# Patient Record
Sex: Female | Born: 1960
Health system: Southern US, Community
[De-identification: ages and names within clinical notes are randomized; demographics above are authoritative.]

## PROBLEM LIST (undated history)

## (undated) DIAGNOSIS — I10 Essential (primary) hypertension: Secondary | ICD-10-CM

## (undated) DIAGNOSIS — J189 Pneumonia, unspecified organism: Secondary | ICD-10-CM

## (undated) DIAGNOSIS — E119 Type 2 diabetes mellitus without complications: Secondary | ICD-10-CM

## (undated) HISTORY — PX: ABDOMINAL HYSTERECTOMY: SHX81

---

## 2008-05-22 ENCOUNTER — Ambulatory Visit: Payer: Self-pay | Admitting: Obstetrics & Gynecology

## 2008-05-22 ENCOUNTER — Inpatient Hospital Stay (HOSPITAL_COMMUNITY): Admission: AD | Admit: 2008-05-22 | Discharge: 2008-05-22 | Payer: Self-pay | Admitting: Obstetrics & Gynecology

## 2008-07-05 ENCOUNTER — Ambulatory Visit (HOSPITAL_COMMUNITY): Admission: RE | Admit: 2008-07-05 | Discharge: 2008-07-05 | Payer: Self-pay | Admitting: Family Medicine

## 2008-07-05 ENCOUNTER — Ambulatory Visit: Payer: Self-pay | Admitting: Obstetrics & Gynecology

## 2010-05-21 LAB — WET PREP, GENITAL
Trich, Wet Prep: NONE SEEN
Yeast Wet Prep HPF POC: NONE SEEN

## 2010-05-21 LAB — CBC
MCHC: 35.3 g/dL (ref 30.0–36.0)
RBC: 4.15 MIL/uL (ref 3.87–5.11)
RDW: 13.4 % (ref 11.5–15.5)

## 2010-06-24 NOTE — Group Therapy Note (Signed)
April Fry, April Fry          ACCOUNT NO.:  [id number]   MEDICAL RECORD NO.:  [id number]          PATIENT TYPE:  WOC   LOCATION:  [facility]                   FACILITY:  [facility]   PHYSICIAN:  Elsie Lincoln, MD      DATE OF BIRTH:  1960-04-26   DATE OF SERVICE:                                  CLINIC NOTE   Patient is a 50 year old female, G5, P5 who presents for followup of an  ovarian cyst that was found on May 22, 2008, in the [facility].  Patient had  2 small left ovarian cysts with some pain.  The pain is basically gone.  The patient had a CA-125 which was 12.5 and then a followup ultrasound  today which shows resolution of her left ovarian cyst.   PAST MEDICAL HISTORY:  Denies all major medical problems.   PAST SURGICAL HISTORY:  1. A hysterectomy and right oophorectomy.  2. An exploratory laparotomy with questionable small bowel      obstruction/adhesions.   MEDICATIONS:  None.   SOCIAL HISTORY:  Nonsmoker, nondrinker.  No drug abuse.   GYN HISTORY:  Para 5.  No history of STDs, abnormal Pap smears.  She did  have a hysterectomy for what sounds like bleeding and questionably  ovarian cysts.   FAMILY HISTORY:  Denies all major problems.   ALLERGIES:  NONE.   ASSESSMENT AND PLAN:  A 50 year old female with resolved left ovarian  cyst.  1. Patient needs a mammogram.  2. The patient can follow up in our clinic p.r.n.           ______________________________  Elsie Lincoln, MD     KL/MEDQ  D:  07/05/2008  T:  07/05/2008  Job:  [id number]

## 2016-05-15 ENCOUNTER — Encounter (HOSPITAL_COMMUNITY): Payer: Self-pay | Admitting: Nurse Practitioner

## 2016-05-15 ENCOUNTER — Emergency Department (HOSPITAL_COMMUNITY): Payer: Self-pay

## 2016-05-15 ENCOUNTER — Emergency Department (HOSPITAL_COMMUNITY)
Admission: EM | Admit: 2016-05-15 | Discharge: 2016-05-15 | Disposition: A | Discharge status: Home / Self Care | Payer: Self-pay | Attending: Emergency Medicine | Admitting: Emergency Medicine

## 2016-05-15 DIAGNOSIS — J181 Lobar pneumonia, unspecified organism: Secondary | ICD-10-CM | POA: Insufficient documentation

## 2016-05-15 DIAGNOSIS — Z79899 Other long term (current) drug therapy: Secondary | ICD-10-CM | POA: Insufficient documentation

## 2016-05-15 DIAGNOSIS — I10 Essential (primary) hypertension: Secondary | ICD-10-CM | POA: Insufficient documentation

## 2016-05-15 DIAGNOSIS — J189 Pneumonia, unspecified organism: Secondary | ICD-10-CM

## 2016-05-15 DIAGNOSIS — E119 Type 2 diabetes mellitus without complications: Secondary | ICD-10-CM | POA: Insufficient documentation

## 2016-05-15 HISTORY — DX: Type 2 diabetes mellitus without complications: E11.9

## 2016-05-15 HISTORY — DX: Essential (primary) hypertension: I10

## 2016-05-15 LAB — CBC WITH DIFFERENTIAL/PLATELET
BASOS ABS: 0 10*3/uL (ref 0.0–0.1)
Basophils Relative: 0 %
Eosinophils Absolute: 0.1 10*3/uL (ref 0.0–0.7)
Eosinophils Relative: 1 %
HEMATOCRIT: 41.8 % (ref 36.0–46.0)
HEMOGLOBIN: 14.3 g/dL (ref 12.0–15.0)
LYMPHS PCT: 20 %
Lymphs Abs: 1.3 10*3/uL (ref 0.7–4.0)
MCH: 29.4 pg (ref 26.0–34.0)
MCHC: 34.2 g/dL (ref 30.0–36.0)
MCV: 86 fL (ref 78.0–100.0)
MONO ABS: 0.4 10*3/uL (ref 0.1–1.0)
Monocytes Relative: 7 %
NEUTROS ABS: 4.7 10*3/uL (ref 1.7–7.7)
NEUTROS PCT: 72 %
Platelets: 231 10*3/uL (ref 150–400)
RBC: 4.86 MIL/uL (ref 3.87–5.11)
RDW: 12.8 % (ref 11.5–15.5)
WBC: 6.6 10*3/uL (ref 4.0–10.5)

## 2016-05-15 LAB — URINALYSIS, ROUTINE W REFLEX MICROSCOPIC
BILIRUBIN URINE: NEGATIVE
GLUCOSE, UA: 50 mg/dL — AB
HGB URINE DIPSTICK: NEGATIVE
Ketones, ur: 5 mg/dL — AB
LEUKOCYTES UA: NEGATIVE
Nitrite: NEGATIVE
PROTEIN: 100 mg/dL — AB
Specific Gravity, Urine: 1.038 — ABNORMAL HIGH (ref 1.005–1.030)
pH: 5 (ref 5.0–8.0)

## 2016-05-15 LAB — COMPREHENSIVE METABOLIC PANEL
ALBUMIN: 3.9 g/dL (ref 3.5–5.0)
ALT: 27 U/L (ref 14–54)
ANION GAP: 10 (ref 5–15)
AST: 24 U/L (ref 15–41)
Alkaline Phosphatase: 94 U/L (ref 38–126)
BUN: 7 mg/dL (ref 6–20)
CHLORIDE: 104 mmol/L (ref 101–111)
CO2: 21 mmol/L — ABNORMAL LOW (ref 22–32)
Calcium: 8.7 mg/dL — ABNORMAL LOW (ref 8.9–10.3)
Creatinine, Ser: 0.47 mg/dL (ref 0.44–1.00)
GFR calc Af Amer: >60 mL/min (ref >60)
Glucose, Bld: 199 mg/dL — ABNORMAL HIGH (ref 65–99)
POTASSIUM: 3.7 mmol/L (ref 3.5–5.1)
SODIUM: 135 mmol/L (ref 135–145)
Total Bilirubin: 0.6 mg/dL (ref 0.3–1.2)
Total Protein: 8.2 g/dL — ABNORMAL HIGH (ref 6.5–8.1)

## 2016-05-15 LAB — I-STAT CG4 LACTIC ACID, ED: LACTIC ACID, VENOUS: 1.08 mmol/L (ref 0.5–1.9)

## 2016-05-15 MED ORDER — HYDROCOD POLST-CPM POLST ER 10-8 MG/5ML PO SUER
5.0000 mL | Freq: Once | ORAL | Status: AC
Start: 2016-05-15 — End: 2016-05-15
  Administered 2016-05-15: 5 mL via ORAL
  Filled 2016-05-15: qty 5

## 2016-05-15 MED ORDER — IBUPROFEN 200 MG PO TABS
600.0000 mg | ORAL_TABLET | Freq: Once | ORAL | Status: AC
  Administered 2016-05-15: 600 mg via ORAL
  Filled 2016-05-15: qty 1

## 2016-05-15 MED ORDER — ACETAMINOPHEN 325 MG PO TABS
ORAL_TABLET | ORAL | Status: AC
  Administered 2016-05-15: 650 mg via ORAL
  Filled 2016-05-15: qty 2

## 2016-05-15 MED ORDER — AZITHROMYCIN 250 MG PO TABS
500.0000 mg | ORAL_TABLET | Freq: Once | ORAL | Status: AC
  Administered 2016-05-15: 500 mg via ORAL
  Filled 2016-05-15: qty 2

## 2016-05-15 MED ORDER — ACETAMINOPHEN 325 MG PO TABS
650.0000 mg | ORAL_TABLET | Freq: Once | ORAL | Status: AC | PRN
  Administered 2016-05-15: 650 mg via ORAL

## 2016-05-15 MED ORDER — AZITHROMYCIN 250 MG PO TABS: 250.0000 mg | ORAL_TABLET | Freq: Every day | ORAL | 0 refills | Status: DC

## 2016-05-15 MED ORDER — BENZONATATE 100 MG PO CAPS: 100.0000 mg | ORAL_CAPSULE | Freq: Three times a day (TID) | ORAL | 0 refills | Status: DC | PRN

## 2016-05-15 NOTE — ED Triage Notes (Signed)
Pt presents with c/o cough. The cough began on Monday. She reports malaise, fevers, headaches, body aches, blood tinged sputum. She has been taking OTC cold and cough medications with no improvement. Her symptoms have been getting worse since onset

## 2016-05-15 NOTE — Discharge Instructions (Signed)
Drink plenty of fluids. Rest. Take tylenol and motrin for fever and pain. Salt water gargles several times a day for sore throat. Tessalon as prescribed for cough. Take zithromax as prescribed until all gone for infection. Follow up with family doctor as needed.

## 2016-05-15 NOTE — ED Provider Notes (Signed)
[facility] Provider Note   CSN: [id number] Arrival date & time: 05/15/16  1752     History   Chief Complaint Chief Complaint  Patient presents with  . URI    HPI April Fry is a 56 y.o. female.  HPI April Fry is a 56 y.o. female with history of hypertension and diabetes, presents to emergency department complaining of nasal congestion, sore throat, headache, cough, fever, chills. He states symptoms started 4 days ago. She states that today she started having shortness of breath which is what prompted her to come to emergency department. She has been taking over-the-counter cold and flu medication and Delsym cough syrup with no relief. She denies any chest pain or abdominal pain. No nausea or vomiting. No diarrhea. No neck pain or stiffness. She does admit to shortness of breath especially on exertion.  Past Medical History:  Diagnosis Date  . Diabetes mellitus without complication (HCC)   . Hypertension     There are no active problems to display for this patient.   Past Surgical History:  Procedure Laterality Date  . ABDOMINAL SURGERY      OB History    No data available       Home Medications    Prior to Admission medications   Not on File    Family History History reviewed. No pertinent family history.  Social History Social History  Substance Use Topics  . Smoking status: Never Smoker  . Smokeless tobacco: Never Used  . Alcohol use No     Allergies   Patient has no known allergies.   Review of Systems Review of Systems  Constitutional: Positive for chills and fever.  HENT: Positive for congestion and sore throat.   Respiratory: Positive for cough and shortness of breath. Negative for chest tightness and wheezing.   Cardiovascular: Negative for chest pain, palpitations and leg swelling.  Gastrointestinal: Negative for abdominal pain, diarrhea, nausea and vomiting.  Musculoskeletal: Negative for arthralgias, myalgias,  neck pain and neck stiffness.  Skin: Negative for rash.  Neurological: Positive for headaches. Negative for dizziness and weakness.  All other systems reviewed and are negative.    Physical Exam Updated Vital Signs BP (!) 165/88 (BP Location: Right Arm)   Pulse 92   Temp (!) 101.2 F (38.4 C) (Oral)   Resp 20   Ht  (1.422 m)   Wt 87 kg   SpO2 96%   BMI 43.00 kg/m   Physical Exam  Constitutional: She appears well-developed and well-nourished. No distress.  HENT:  Head: Normocephalic and atraumatic.  Right Ear: Tympanic membrane, external ear and ear canal normal.  Left Ear: Tympanic membrane, external ear and ear canal normal.  Nose: Mucosal edema and rhinorrhea present.  Mouth/Throat: Uvula is midline and mucous membranes are normal. Posterior oropharyngeal erythema present. No oropharyngeal exudate or posterior oropharyngeal edema. Tonsils are 1+ on the right. Tonsils are 1+ on the left.  Eyes: Conjunctivae are normal.  Neck: Neck supple.  Cardiovascular: Normal rate, regular rhythm and normal heart sounds.   Pulmonary/Chest: Effort normal and breath sounds normal. No respiratory distress. She has no wheezes. She has no rales.  Patient is coughing  Abdominal: Soft. Bowel sounds are normal. She exhibits no distension. There is no tenderness. There is no rebound.  Musculoskeletal: She exhibits no edema.  Neurological: She is alert.  Skin: Skin is warm and dry.  Psychiatric: She has a normal mood and affect. Her behavior is normal.  Nursing note and vitals  reviewed.    ED Treatments / Results  Labs (all labs ordered are listed, but only abnormal results are displayed) Labs Reviewed  COMPREHENSIVE METABOLIC PANEL - Abnormal; Notable for the following:       Result Value   CO2 21 (*)    Glucose, Bld 199 (*)    Calcium 8.7 (*)    Total Protein 8.2 (*)    All other components within normal limits  URINALYSIS, ROUTINE W REFLEX MICROSCOPIC - Abnormal; Notable for the  following:    Color, Urine AMBER (*)    APPearance HAZY (*)    Specific Gravity, Urine 1.038 (*)    Glucose, UA 50 (*)    Ketones, ur 5 (*)    Protein, ur 100 (*)    Bacteria, UA RARE (*)    Squamous Epithelial / LPF 0-5 (*)    All other components within normal limits  CBC WITH DIFFERENTIAL/PLATELET  I-STAT CG4 LACTIC ACID, ED    EKG  EKG Interpretation None       Radiology Dg Chest 2 View  Result Date: 05/15/2016 CLINICAL DATA:  Cough, mildly is EXAM: CHEST  2 VIEW COMPARISON:  None. FINDINGS: Right lung is grossly clear. There is ill-defined infiltrate within the lingula and left lower lobe. No pleural effusion. Normal heart size. No pneumothorax. IMPRESSION: Ill-defined lingular and left lower lobe infiltrate. Electronically Signed   By: Jasmine Pang M.D.   On: 05/15/2016 19:05    Procedures Procedures (including critical care time)  Medications Ordered in ED Medications  azithromycin (ZITHROMAX) tablet 500 mg (not administered)  chlorpheniramine-HYDROcodone (TUSSIONEX) 10-8 MG/5ML suspension 5 mL (not administered)  ibuprofen (ADVIL,MOTRIN) tablet 600 mg (not administered)  acetaminophen (TYLENOL) tablet 650 mg (650 mg Oral Given 05/15/16 1824)     Initial Impression / Assessment and Plan / ED Course  I have reviewed the triage vital signs and the nursing notes.  Pertinent labs & imaging results that were available during my care of the patient were reviewed by me and considered in my medical decision making (see chart for details).    Patient in emergency department with fever, chills, URI symptoms, shortness of breath that started today. Her workup was performed by triage nurse and lab work came back all unremarkable. White blood cell count of 6.6. Lactic acid is normal at 1.08. No abnormality in the electrolytes. Patient's initial temperature in emergency was 11.2. She was given Tylenol. I rechecked her temperature myself, it was 100.2. Patient's chest x-ray showing  pneumonia. Her heart rate is normal. Blood pressure high, 156/72. No evidence of sepsis. Pt ambulated in ED, maintaining oxygen sat above 95%. Will start on zithromax. Continue tylenol and motrin for body aches and fever. Tessalon for cough. Follow up with family doctor.  Return precautions discussed.   Vitals:   05/15/16 2100 05/15/16 2115 05/15/16 2130 05/15/16 2145  BP: (!) 152/68 (!) 166/61 (!) 156/72 (!) 165/69  Pulse: 89 95 85 89  Resp:      Temp:      TempSrc:      SpO2: 94% 94% 95% 92%  Weight:      Height:          Final Clinical Impressions(s) / ED Diagnoses   Final diagnoses:  Community acquired pneumonia of left lung, unspecified part of lung    New Prescriptions Discharge Medication List as of 05/15/2016 10:04 PM    START taking these medications   Details  azithromycin (ZITHROMAX) 250 MG tablet Take 1  tablet (250 mg total) by mouth daily. Take first 2 tablets together, then 1 every day until finished., Starting Fri 05/15/2016, Print    benzonatate (TESSALON PERLES) 100 MG capsule Take 1 capsule (100 mg total) by mouth 3 (three) times daily as needed for cough., Starting Fri 05/15/2016, Print         Daniesha Driver, PA-C 05/16/16 1610    Derwood Kaplan, MD 05/24/16 [id number]

## 2016-05-20 ENCOUNTER — Encounter (HOSPITAL_COMMUNITY): Payer: Self-pay

## 2016-05-20 ENCOUNTER — Emergency Department (HOSPITAL_COMMUNITY): Payer: Self-pay

## 2016-05-20 DIAGNOSIS — M940 Chondrocostal junction syndrome [Tietze]: Secondary | ICD-10-CM | POA: Diagnosis present

## 2016-05-20 DIAGNOSIS — R0902 Hypoxemia: Secondary | ICD-10-CM | POA: Diagnosis present

## 2016-05-20 DIAGNOSIS — E119 Type 2 diabetes mellitus without complications: Secondary | ICD-10-CM | POA: Diagnosis present

## 2016-05-20 DIAGNOSIS — J209 Acute bronchitis, unspecified: Secondary | ICD-10-CM | POA: Diagnosis present

## 2016-05-20 DIAGNOSIS — I10 Essential (primary) hypertension: Secondary | ICD-10-CM | POA: Diagnosis present

## 2016-05-20 DIAGNOSIS — I447 Left bundle-branch block, unspecified: Secondary | ICD-10-CM | POA: Diagnosis present

## 2016-05-20 DIAGNOSIS — J189 Pneumonia, unspecified organism: Principal | ICD-10-CM | POA: Diagnosis present

## 2016-05-20 DIAGNOSIS — Z90710 Acquired absence of both cervix and uterus: Secondary | ICD-10-CM

## 2016-05-20 LAB — BASIC METABOLIC PANEL
Anion gap: 6 (ref 5–15)
BUN: 12 mg/dL (ref 6–20)
CHLORIDE: 105 mmol/L (ref 101–111)
CO2: 24 mmol/L (ref 22–32)
CREATININE: 0.5 mg/dL (ref 0.44–1.00)
Calcium: 8.8 mg/dL — ABNORMAL LOW (ref 8.9–10.3)
GFR calc Af Amer: >60 mL/min (ref >60)
Glucose, Bld: 185 mg/dL — ABNORMAL HIGH (ref 65–99)
POTASSIUM: 3.9 mmol/L (ref 3.5–5.1)
SODIUM: 135 mmol/L (ref 135–145)

## 2016-05-20 LAB — CBC
HEMATOCRIT: 40.7 % (ref 36.0–46.0)
Hemoglobin: 13.7 g/dL (ref 12.0–15.0)
MCH: 28.5 pg (ref 26.0–34.0)
MCHC: 33.7 g/dL (ref 30.0–36.0)
MCV: 84.6 fL (ref 78.0–100.0)
PLATELETS: 297 10*3/uL (ref 150–400)
RBC: 4.81 MIL/uL (ref 3.87–5.11)
RDW: 12.6 % (ref 11.5–15.5)
WBC: 6.1 10*3/uL (ref 4.0–10.5)

## 2016-05-20 LAB — I-STAT TROPONIN, ED: Troponin i, poc: 0 ng/mL (ref 0.00–0.08)

## 2016-05-20 NOTE — ED Triage Notes (Signed)
Pt was seen her last week for cough and CP and SOB, diagnosed with pneumonia, finished ABT and continues to have SOB,CP and productive blood tinged sputum. Reports fevers

## 2016-05-21 ENCOUNTER — Inpatient Hospital Stay (HOSPITAL_COMMUNITY)
Admission: EM | Admit: 2016-05-21 | Discharge: 2016-05-23 | DRG: 195 | Disposition: A | Discharge status: Home / Self Care | Payer: Self-pay | Attending: Family Medicine | Admitting: Family Medicine

## 2016-05-21 ENCOUNTER — Emergency Department (HOSPITAL_COMMUNITY): Payer: Self-pay

## 2016-05-21 ENCOUNTER — Encounter (HOSPITAL_COMMUNITY): Payer: Self-pay | Admitting: Family Medicine

## 2016-05-21 DIAGNOSIS — E119 Type 2 diabetes mellitus without complications: Secondary | ICD-10-CM

## 2016-05-21 DIAGNOSIS — J181 Lobar pneumonia, unspecified organism: Secondary | ICD-10-CM

## 2016-05-21 DIAGNOSIS — J189 Pneumonia, unspecified organism: Secondary | ICD-10-CM

## 2016-05-21 DIAGNOSIS — R0902 Hypoxemia: Secondary | ICD-10-CM

## 2016-05-21 DIAGNOSIS — I10 Essential (primary) hypertension: Secondary | ICD-10-CM

## 2016-05-21 DIAGNOSIS — I447 Left bundle-branch block, unspecified: Secondary | ICD-10-CM | POA: Diagnosis present

## 2016-05-21 HISTORY — DX: Pneumonia, unspecified organism: J18.9

## 2016-05-21 LAB — GLUCOSE, CAPILLARY
GLUCOSE-CAPILLARY: 173 mg/dL — AB (ref 65–99)
GLUCOSE-CAPILLARY: 169 mg/dL — AB (ref 65–99)
Glucose-Capillary: 184 mg/dL — ABNORMAL HIGH (ref 65–99)
Glucose-Capillary: 204 mg/dL — ABNORMAL HIGH (ref 65–99)

## 2016-05-21 LAB — EXPECTORATED SPUTUM ASSESSMENT W GRAM STAIN, RFLX TO RESP C

## 2016-05-21 LAB — CBC
HEMATOCRIT: 39.8 % (ref 36.0–46.0)
HEMOGLOBIN: 13.4 g/dL (ref 12.0–15.0)
MCH: 28.6 pg (ref 26.0–34.0)
MCHC: 33.7 g/dL (ref 30.0–36.0)
MCV: 85 fL (ref 78.0–100.0)
Platelets: 272 10*3/uL (ref 150–400)
RBC: 4.68 MIL/uL (ref 3.87–5.11)
RDW: 12.7 % (ref 11.5–15.5)
WBC: 6 10*3/uL (ref 4.0–10.5)

## 2016-05-21 LAB — HEMOGLOBIN A1C
HEMOGLOBIN A1C: 9.2 % — AB (ref 4.8–5.6)
Mean Plasma Glucose: 217 mg/dL

## 2016-05-21 LAB — CREATININE, SERUM: Creatinine, Ser: 0.45 mg/dL (ref 0.44–1.00)

## 2016-05-21 LAB — EXPECTORATED SPUTUM ASSESSMENT W REFEX TO RESP CULTURE

## 2016-05-21 LAB — TROPONIN I: Troponin I: <0.03 ng/mL (ref <0.03)

## 2016-05-21 LAB — D-DIMER, QUANTITATIVE: D-Dimer, Quant: <0.27 ug/mL-FEU (ref 0.00–0.50)

## 2016-05-21 LAB — STREP PNEUMONIAE URINARY ANTIGEN: STREP PNEUMO URINARY ANTIGEN: NEGATIVE

## 2016-05-21 MED ORDER — HYDRALAZINE HCL 20 MG/ML IJ SOLN: 10.0000 mg | Freq: Three times a day (TID) | INTRAMUSCULAR | Status: DC | PRN

## 2016-05-21 MED ORDER — ONDANSETRON HCL 4 MG/2ML IJ SOLN: 4.0000 mg | Freq: Four times a day (QID) | INTRAMUSCULAR | Status: DC | PRN

## 2016-05-21 MED ORDER — ONDANSETRON HCL 4 MG PO TABS: 4.0000 mg | ORAL_TABLET | Freq: Four times a day (QID) | ORAL | Status: DC | PRN

## 2016-05-21 MED ORDER — TRAZODONE HCL 50 MG PO TABS
25.0000 mg | ORAL_TABLET | Freq: Every evening | ORAL | Status: DC | PRN
Start: 2016-05-21 — End: 2016-05-23

## 2016-05-21 MED ORDER — IOPAMIDOL (ISOVUE-370) INJECTION 76%
INTRAVENOUS | Status: AC
  Administered 2016-05-21: 100 mL
  Filled 2016-05-21: qty 100

## 2016-05-21 MED ORDER — ALBUTEROL SULFATE (2.5 MG/3ML) 0.083% IN NEBU
2.5000 mg | INHALATION_SOLUTION | RESPIRATORY_TRACT | Status: DC | PRN
Start: 2016-05-21 — End: 2016-05-22

## 2016-05-21 MED ORDER — LEVOFLOXACIN IN D5W 750 MG/150ML IV SOLN
750.0000 mg | INTRAVENOUS | Status: DC
  Administered 2016-05-22 – 2016-05-23 (×2): 750 mg via INTRAVENOUS
  Filled 2016-05-21 (×2): qty 150

## 2016-05-21 MED ORDER — CYCLOBENZAPRINE HCL 10 MG PO TABS
10.0000 mg | ORAL_TABLET | Freq: Once | ORAL | Status: AC
  Administered 2016-05-21: 10 mg via ORAL
  Filled 2016-05-21: qty 1

## 2016-05-21 MED ORDER — HEPARIN SODIUM (PORCINE) 5000 UNIT/ML IJ SOLN
5000.0000 [IU] | Freq: Three times a day (TID) | INTRAMUSCULAR | Status: DC
  Administered 2016-05-21 – 2016-05-23 (×7): 5000 [IU] via SUBCUTANEOUS
  Filled 2016-05-21 (×7): qty 1

## 2016-05-21 MED ORDER — SODIUM CHLORIDE 0.9 % IV BOLUS (SEPSIS)
1000.0000 mL | Freq: Once | INTRAVENOUS | Status: AC
  Administered 2016-05-21: 1000 mL via INTRAVENOUS

## 2016-05-21 MED ORDER — INSULIN ASPART 100 UNIT/ML ~~LOC~~ SOLN
0.0000 [IU] | Freq: Three times a day (TID) | SUBCUTANEOUS | Status: DC
  Administered 2016-05-21: 4 [IU] via SUBCUTANEOUS
  Administered 2016-05-21: 7 [IU] via SUBCUTANEOUS
  Administered 2016-05-21 – 2016-05-22 (×2): 4 [IU] via SUBCUTANEOUS
  Administered 2016-05-22: 3 [IU] via SUBCUTANEOUS
  Administered 2016-05-22: 4 [IU] via SUBCUTANEOUS
  Administered 2016-05-23: 3 [IU] via SUBCUTANEOUS
  Administered 2016-05-23: 7 [IU] via SUBCUTANEOUS

## 2016-05-21 MED ORDER — INSULIN ASPART 100 UNIT/ML ~~LOC~~ SOLN: 0.0000 [IU] | Freq: Three times a day (TID) | SUBCUTANEOUS | Status: DC

## 2016-05-21 MED ORDER — POLYETHYLENE GLYCOL 3350 17 G PO PACK
17.0000 g | PACK | Freq: Every day | ORAL | Status: DC | PRN
Start: 2016-05-21 — End: 2016-05-23

## 2016-05-21 MED ORDER — IPRATROPIUM-ALBUTEROL 0.5-2.5 (3) MG/3ML IN SOLN
3.0000 mL | Freq: Four times a day (QID) | RESPIRATORY_TRACT | Status: DC
  Administered 2016-05-21 (×3): 3 mL via RESPIRATORY_TRACT
  Filled 2016-05-21 (×3): qty 3

## 2016-05-21 MED ORDER — LEVOFLOXACIN IN D5W 750 MG/150ML IV SOLN: 750.0000 mg | Freq: Once | INTRAVENOUS | Status: DC

## 2016-05-21 MED ORDER — ASPIRIN 325 MG PO TABS
325.0000 mg | ORAL_TABLET | Freq: Once | ORAL | Status: AC
  Administered 2016-05-21: 325 mg via ORAL
  Filled 2016-05-21: qty 1

## 2016-05-21 MED ORDER — HYDROCODONE-ACETAMINOPHEN 5-325 MG PO TABS: 1.0000 | ORAL_TABLET | ORAL | Status: DC | PRN

## 2016-05-21 MED ORDER — SODIUM CHLORIDE 0.9% FLUSH
3.0000 mL | Freq: Two times a day (BID) | INTRAVENOUS | Status: DC
  Administered 2016-05-21 – 2016-05-22 (×4): 3 mL via INTRAVENOUS

## 2016-05-21 MED ORDER — ACETAMINOPHEN 325 MG PO TABS
650.0000 mg | ORAL_TABLET | Freq: Four times a day (QID) | ORAL | Status: DC | PRN
  Administered 2016-05-21: 650 mg via ORAL
  Filled 2016-05-21: qty 2

## 2016-05-21 MED ORDER — ACETAMINOPHEN 650 MG RE SUPP: 650.0000 mg | Freq: Four times a day (QID) | RECTAL | Status: DC | PRN

## 2016-05-21 MED ORDER — NITROGLYCERIN 0.4 MG SL SUBL: 0.4000 mg | SUBLINGUAL_TABLET | SUBLINGUAL | Status: DC | PRN

## 2016-05-21 MED ORDER — LEVOFLOXACIN IN D5W 750 MG/150ML IV SOLN
750.0000 mg | Freq: Once | INTRAVENOUS | Status: AC
  Administered 2016-05-21: 750 mg via INTRAVENOUS
  Filled 2016-05-21: qty 150

## 2016-05-21 MED ORDER — IPRATROPIUM-ALBUTEROL 0.5-2.5 (3) MG/3ML IN SOLN
3.0000 mL | Freq: Once | RESPIRATORY_TRACT | Status: AC
  Administered 2016-05-21: 3 mL via RESPIRATORY_TRACT
  Filled 2016-05-21: qty 3

## 2016-05-21 MED ORDER — IPRATROPIUM-ALBUTEROL 0.5-2.5 (3) MG/3ML IN SOLN: 3.0000 mL | Freq: Four times a day (QID) | RESPIRATORY_TRACT | Status: DC

## 2016-05-21 MED ORDER — IPRATROPIUM-ALBUTEROL 0.5-2.5 (3) MG/3ML IN SOLN
3.0000 mL | Freq: Three times a day (TID) | RESPIRATORY_TRACT | Status: DC
  Administered 2016-05-22: 3 mL via RESPIRATORY_TRACT
  Filled 2016-05-21: qty 3

## 2016-05-21 MED ORDER — GUAIFENESIN ER 600 MG PO TB12
600.0000 mg | ORAL_TABLET | Freq: Two times a day (BID) | ORAL | Status: DC
  Administered 2016-05-21 – 2016-05-23 (×5): 600 mg via ORAL
  Filled 2016-05-21 (×5): qty 1

## 2016-05-21 NOTE — ED Provider Notes (Signed)
[facility] Provider Note   CSN: [id number] Arrival date & time: 05/20/16  2051   By signing my name below, I, Freida Busman, attest that this documentation has been prepared under the direction and in the presence of Glynn Octave, MD . Electronically Signed: Freida Busman, Scribe. 05/21/2016. 2:21 AM.  History   Chief Complaint Chief Complaint  Patient presents with  . Chest Pain  . Shortness of Breath  . Cough    The history is provided by the patient and a relative. No language interpreter was used.     HPI Comments:  April Fry is a 56 y.o. female who presents to the Emergency Department complaining of productive cough for a little over 1 week. Pt was seen in the on ED on 05/15/2016 and was diagnosed with PNA. She was discharged with Zithromax which she completed. She notes the cough has worsened.  Pt reports associated low grade fever with TMAX of 100.2, SOB, and CP secondary to cough. She denies vomiting. No recent travel outside of the [place] No sick contacts at home. Pt is not a native english speaker history translated by daughter.   Past Medical History:  Diagnosis Date  . Diabetes mellitus without complication (HCC)   . Hypertension     There are no active problems to display for this patient.   Past Surgical History:  Procedure Laterality Date  . ABDOMINAL SURGERY      OB History    No data available       Home Medications    Prior to Admission medications   Medication Sig Start Date End Date Taking? Authorizing Provider  benzonatate (TESSALON PERLES) 100 MG capsule Take 1 capsule (100 mg total) by mouth 3 (three) times daily as needed for cough. 05/15/16  Yes Tatyana Kirichenko, PA-C  ibuprofen (ADVIL,MOTRIN) 200 MG tablet Take 400 mg by mouth every 6 (six) hours as needed.   Yes Historical Provider, MD    Family History No family history on file.  Social History Social History  Substance Use Topics  . Smoking status: Never Smoker    . Smokeless tobacco: Never Used  . Alcohol use No     Allergies   Patient has no known allergies.   Review of Systems Review of Systems All systems reviewed and are negative for acute change except as noted in the HPI.   Physical Exam Updated Vital Signs BP (!) 145/58   Pulse 78   Temp 98.9 F (37.2 C) (Oral)   Resp 20   SpO2 (!) 82%   Physical Exam  Constitutional: She is oriented to person, place, and time. She appears well-developed and well-nourished. No distress.  HENT:  Head: Normocephalic and atraumatic.  Mouth/Throat: Oropharynx is clear and moist. No oropharyngeal exudate.  Eyes: Conjunctivae and EOM are normal. Pupils are equal, round, and reactive to light.  Neck: Normal range of motion. Neck supple.  No meningismus.  Cardiovascular: Normal rate, regular rhythm, normal heart sounds and intact distal pulses.   No murmur heard. Pulmonary/Chest: Effort normal. No respiratory distress. She has wheezes. She exhibits tenderness.  Dry cough  Good air exchange with scattered expiratory wheeze  Abdominal: Soft. There is no tenderness. There is no rebound and no guarding.  Musculoskeletal: Normal range of motion. She exhibits no edema or tenderness.  Neurological: She is alert and oriented to person, place, and time. No cranial nerve deficit. She exhibits normal muscle tone. Coordination normal.  No ataxia on finger to nose bilaterally. No pronator  drift. 5/5 strength throughout. CN 2-12 intact.Equal grip strength. Sensation intact.   Skin: Skin is warm.  Psychiatric: She has a normal mood and affect. Her behavior is normal.  Nursing note and vitals reviewed.    ED Treatments / Results   DIAGNOSTIC STUDIES:  Oxygen Saturation is 96% on RA, normal by my interpretation.    COORDINATION OF CARE:  2:03 AM Pt updated with XR results. Discussed treatment plan with pt at bedside and pt agreed to plan. Labs (all labs ordered are listed, but only abnormal results are  displayed) Labs Reviewed  BASIC METABOLIC PANEL - Abnormal; Notable for the following:       Result Value   Glucose, Bld 185 (*)    Calcium 8.8 (*)    All other components within normal limits  CBC  D-DIMER, QUANTITATIVE (NOT AT [facility])  TROPONIN I  I-STAT TROPOININ, ED    EKG  EKG Interpretation  Date/Time:  Wednesday May 20 2016 21:00:06 EDT Ventricular Rate:  83 PR Interval:  124 QRS Duration: 134 QT Interval:  406 QTC Calculation: 477 R Axis:   7 Text Interpretation:  Normal sinus rhythm Left bundle branch block Abnormal ECG No previous ECGs available Confirmed by Manus Gunning  MD, Zelina Jimerson [phone]) on 05/21/2016 1:52:32 AM       Radiology Dg Chest 2 View  Result Date: 05/20/2016 CLINICAL DATA:  Chest pain with cough EXAM: CHEST  2 VIEW COMPARISON:  05/15/2016 CXR FINDINGS: Low lung volumes with crowding of interstitial lung markings. Mild interstitial prominence suggestive of chronic bronchitic change. Subtle airspace opacities in the lingula and left lower lobe are less prominent on current exam. Streaky atelectasis is suggested medially at the left lung base. No alveolar consolidation, effusion nor overt pulmonary edema. No acute nor suspicious osseous abnormalities. Aortic atherosclerosis the arch. Borderline cardiomegaly. IMPRESSION: Mild chronic bronchitic change of the lungs. Aortic atherosclerosis. Electronically Signed   By: Tollie Eth M.D.   On: 05/20/2016 21:51   Ct Angio Chest Pe W And/or Wo Contrast  Result Date: 05/21/2016 CLINICAL DATA:  Acute onset of shortness of breath and generalized chest pain. Initial encounter. EXAM: CT ANGIOGRAPHY CHEST WITH CONTRAST TECHNIQUE: Multidetector CT imaging of the chest was performed using the standard protocol during bolus administration of intravenous contrast. Multiplanar CT image reconstructions and MIPs were obtained to evaluate the vascular anatomy. CONTRAST:  61 mL of Isovue 370 IV contrast COMPARISON:  Chest radiograph  performed 05/20/2016 FINDINGS: Cardiovascular:  There is no evidence of pulmonary embolus. The heart is normal in size. The thoracic aorta is grossly unremarkable, aside from minimal calcification. The great vessels are within normal limits. Mediastinum/Nodes: The mediastinum is otherwise unremarkable. No mediastinal lymphadenopathy is seen. No pericardial effusion is identified. The thyroid gland is unremarkable. No axillary lymphadenopathy is appreciated. Lungs/Pleura: Nodular opacity at the left lung base raises concern for atypical pneumonia. No pleural effusion or pneumothorax is seen. No dominant mass is identified. Upper Abdomen: The visualized portions of the liver and spleen are grossly unremarkable. The visualized portions of the gallbladder, pancreas and adrenal glands are within normal limits. A tiny hiatal hernia is seen. A nonspecific 9 mm node is noted adjacent to the distal esophagus. Musculoskeletal: No acute osseous abnormalities are identified. The visualized musculature is unremarkable in appearance. Review of the MIP images confirms the above findings. IMPRESSION: 1. No evidence of pulmonary embolus. 2. Nodular airspace opacity at the left lung base raises concern for atypical pneumonia. 3. Tiny hiatal hernia. 4. Nonspecific  9 mm node adjacent to the distal esophagus. Electronically Signed   By: Roanna Raider M.D.   On: 05/21/2016 04:47    Procedures Procedures (including critical care time)  Medications Ordered in ED Medications - No data to display   Initial Impression / Assessment and Plan / ED Course  I have reviewed the triage vital signs and the nursing notes.  Pertinent labs & imaging results that were available during my care of the patient were reviewed by me and considered in my medical decision making (see chart for details).    Patient discharged with pneumonia on April 6. Presents with continued chest pain, shortness of breath and productive cough. Did finish  Zithromax. Intermittent blood-tinged sputum and subjective fever.  Labs obtained. Patient with some hypoxia in the ED to low 80s. Chest x-ray does not show any infiltrate. Troponin is negative. Low suspicion for ACS. CT scan obtained. this does not show pulmonary embolism but does show persistent left basilar airspace disease.  We'll treat with IV Levaquin. Patient remains tachycardic as well as hypoxic with ambulation  Plan admission for IV antibiotics as patient has failed by mouth antibiotics and has a new O2 requirement. Discussed with Dr. Toniann Fail.      Final Clinical Impressions(s) / ED Diagnoses   Final diagnoses:  Community acquired pneumonia of left lower lobe of lung (HCC)  Hypoxia    New Prescriptions New Prescriptions   No medications on file   I personally performed the services described in this documentation, which was scribed in my presence. The recorded information has been reviewed and is accurate.     Glynn Octave, MD 05/21/16 [phone]

## 2016-05-21 NOTE — H&P (Signed)
History and Physical    Jaylenn Baiza WUJ:[id number] DOB: November 12, 1960 DOA: 05/21/2016  PCP: No PCP Per Patient   Patient coming from: home  Chief Complaint: SOB, cough  HPI: April Fry is a 56 y.o. female with medical history significant of HTN and DM type II was diagnosed with PNA on 05/15/2016, placed on Zithromax and sent home.  She accomplished the course of Zithromax, but still has not improved much. Today patient returned to the ED with c/o worsening cough productive of bloody tinged sputum, SOB, chest pain with coughing and fevers, the highest 100.79F Patient denied abdominal pain, nausea, vomiting, myalgia, rhinorrhea. Pos HA.  I used the interpreter to interview the patient.  ED Course: On arrival patient was afebrile, vital signs stable, however later blood pressure escalated to 189/84 Blood work sinus rhythm, LBBB, nonspecific changes with T-wave inversion in only one lead-fluid 3. No previous EKG available for comparison Chest x-ray demonstrated mild chronic bronchitic changes of the lungs, subtle airspace opacities in the lingula and left lobe are less prominent on current exam Blood work showed normal CBC, BMP and associated glucose 185 and slightly low calcium 8.8, troponin was negative less than 0.03  Review of Systems: As per HPI otherwise 10 point review of systems negative.   Ambulatory Status: Independent  Past Medical History:  Diagnosis Date  . Diabetes mellitus without complication (HCC)   . Hypertension     Past Surgical History:  Procedure Laterality Date  . ABDOMINAL HYSTERECTOMY      Social History   Social History  . Marital status: Single    Spouse name: N/A  . Number of children: N/A  . Years of education: N/A   Occupational History  . Not on file.   Social History Main Topics  . Smoking status: Never Smoker  . Smokeless tobacco: Never Used  . Alcohol use No  . Drug use: No  . Sexual activity: Not on file   Other Topics  Concern  . Not on file   Social History Narrative  . No narrative on file    No Known Allergies  No family history on file.  Prior to Admission medications   Medication Sig Start Date End Date Taking? Authorizing Provider  benzonatate (TESSALON PERLES) 100 MG capsule Take 1 capsule (100 mg total) by mouth 3 (three) times daily as needed for cough. 05/15/16  Yes Tatyana Kirichenko, PA-C  ibuprofen (ADVIL,MOTRIN) 200 MG tablet Take 400 mg by mouth every 6 (six) hours as needed.   Yes Historical Provider, MD    Physical Exam: Vitals:   05/21/16 0530 05/21/16 0602 05/21/16 0618 05/21/16 0618  BP: (!) 173/72 (!) 129/91  (!) 172/83  Pulse: 70 71  76  Resp: Temp:    98.2 F (36.8 C)  TempSrc:      SpO2: 95% 100%  100%  Weight:   83.7 kg (184 lb 8 oz)   Height:   5' (1.524 m)      General: Appears calm and comfortable Eyes: PERRLA, EOMI, normal lids, iris ENT:  grossly normal hearing, lips & tongue, mucous membranes moist and intact Neck: no lymphoadenopathy, masses or thyromegaly Cardiovascular: RRR, no m/r/g. No JVD, carotid bruits. No LE edema.  Respiratory: bilateral no wheezes, rales, rhonchi or cracles. Normal respiratory effort. No accessory muscle use observed Abdomen: soft, non-tender, non-distended, no organomegaly or masses appreciated. BS present in all quadrants Skin: no rash, ulcers or induration seen on limited exam Musculoskeletal: grossly  normal tone BUE/BLE, good ROM, no bony abnormality or joint deformities observed Psychiatric: grossly normal mood and affect, speech fluent and appropriate, alert and oriented x3 Neurologic: CN II-XII grossly intact, moves all extremities in coordinated fashion, sensation intact  Labs on Admission: I have personally reviewed following labs and imaging studies  CBC, BMP  GFR: Estimated Creatinine Clearance: 75.4 mL/min (by C-G formula based on SCr of 0.5 mg/dL).   Creatinine Clearance: Estimated Creatinine  Clearance: 75.4 mL/min (by C-G formula based on SCr of 0.5 mg/dL).    Radiological Exams on Admission: Dg Chest 2 View  Result Date: 05/20/2016 CLINICAL DATA:  Chest pain with cough EXAM: CHEST  2 VIEW COMPARISON:  05/15/2016 CXR FINDINGS: Low lung volumes with crowding of interstitial lung markings. Mild interstitial prominence suggestive of chronic bronchitic change. Subtle airspace opacities in the lingula and left lower lobe are less prominent on current exam. Streaky atelectasis is suggested medially at the left lung base. No alveolar consolidation, effusion nor overt pulmonary edema. No acute nor suspicious osseous abnormalities. Aortic atherosclerosis the arch. Borderline cardiomegaly. IMPRESSION: Mild chronic bronchitic change of the lungs. Aortic atherosclerosis. Electronically Signed   By: Tollie Eth M.D.   On: 05/20/2016 21:51   Ct Angio Chest Pe W And/or Wo Contrast  Result Date: 05/21/2016 CLINICAL DATA:  Acute onset of shortness of breath and generalized chest pain. Initial encounter. EXAM: CT ANGIOGRAPHY CHEST WITH CONTRAST TECHNIQUE: Multidetector CT imaging of the chest was performed using the standard protocol during bolus administration of intravenous contrast. Multiplanar CT image reconstructions and MIPs were obtained to evaluate the vascular anatomy. CONTRAST:  61 mL of Isovue 370 IV contrast COMPARISON:  Chest radiograph performed 05/20/2016 FINDINGS: Cardiovascular:  There is no evidence of pulmonary embolus. The heart is normal in size. The thoracic aorta is grossly unremarkable, aside from minimal calcification. The great vessels are within normal limits. Mediastinum/Nodes: The mediastinum is otherwise unremarkable. No mediastinal lymphadenopathy is seen. No pericardial effusion is identified. The thyroid gland is unremarkable. No axillary lymphadenopathy is appreciated. Lungs/Pleura: Nodular opacity at the left lung base raises concern for atypical pneumonia. No pleural  effusion or pneumothorax is seen. No dominant mass is identified. Upper Abdomen: The visualized portions of the liver and spleen are grossly unremarkable. The visualized portions of the gallbladder, pancreas and adrenal glands are within normal limits. A tiny hiatal hernia is seen. A nonspecific 9 mm node is noted adjacent to the distal esophagus. Musculoskeletal: No acute osseous abnormalities are identified. The visualized musculature is unremarkable in appearance. Review of the MIP images confirms the above findings. IMPRESSION: 1. No evidence of pulmonary embolus. 2. Nodular airspace opacity at the left lung base raises concern for atypical pneumonia. 3. Tiny hiatal hernia. 4. Nonspecific 9 mm node adjacent to the distal esophagus. Electronically Signed   By: Roanna Raider M.D.   On: 05/21/2016 04:47    EKG: Independently reviewed - sinus rhythm, LBBB, nonspecific changes with T-wave inversion in aVL. No previous EKG available for comparison  Assessment/Plan Principal Problem:   CAP (community acquired pneumonia) Active Problems:   Diabetes mellitus (HCC)   HTN (hypertension)   LBBB (left bundle branch block)   Acute Bronchitis - continue antibiotic therapy, nebulizer therapy prn, expectorant / antitussive prn Flutter valve  Costochondritis Due to cough Reproducible Flexeril x1  Diabetes Mellitus type II Will check Hgb A1c Currently not on any antiglycemic meds Start SSI and continue carb controlled diet   HTN - BP is accelerated to 189/84  mmHg-->172/83 mmgHg Currently patient is not on nay antihypertensives Continue to monitor, add hydralazine prn  LBBB Initial troponin negative We'll cycle troponins Echo ordered Ntg prn   DVT prophylaxis: heaprin Code Status: full Family Communication: none Disposition Plan: telemetry Consults called: none Admission status: observation   Raymon Mutton, PA-C (scribe) Pager: [phone] [facility]  If 7PM-7AM, please  contact night-coverage [email or web address] Password [email or web address] 05/21/2016, 8:53 AM    Haydee Salter 05/21/2016, 1:44 PM

## 2016-05-21 NOTE — ED Notes (Signed)
Pt O2 was between 85-90% while ambulating on room air.

## 2016-05-21 NOTE — ED Notes (Signed)
Patient taken to CT.

## 2016-05-21 NOTE — ED Notes (Signed)
Patient returned from CT

## 2016-05-22 DIAGNOSIS — J181 Lobar pneumonia, unspecified organism: Secondary | ICD-10-CM

## 2016-05-22 LAB — CBC
HEMATOCRIT: 37.8 % (ref 36.0–46.0)
HEMOGLOBIN: 12.6 g/dL (ref 12.0–15.0)
MCH: 28.5 pg (ref 26.0–34.0)
MCHC: 33.3 g/dL (ref 30.0–36.0)
MCV: 85.5 fL (ref 78.0–100.0)
PLATELETS: 265 10*3/uL (ref 150–400)
RBC: 4.42 MIL/uL (ref 3.87–5.11)
RDW: 13 % (ref 11.5–15.5)
WBC: 5.7 10*3/uL (ref 4.0–10.5)

## 2016-05-22 LAB — BASIC METABOLIC PANEL
ANION GAP: 7 (ref 5–15)
BUN: 6 mg/dL (ref 6–20)
CALCIUM: 8.6 mg/dL — AB (ref 8.9–10.3)
CO2: 25 mmol/L (ref 22–32)
Chloride: 106 mmol/L (ref 101–111)
Creatinine, Ser: 0.44 mg/dL (ref 0.44–1.00)
Glucose, Bld: 145 mg/dL — ABNORMAL HIGH (ref 65–99)
POTASSIUM: 3.6 mmol/L (ref 3.5–5.1)
Sodium: 138 mmol/L (ref 135–145)

## 2016-05-22 LAB — GLUCOSE, CAPILLARY
GLUCOSE-CAPILLARY: 150 mg/dL — AB (ref 65–99)
GLUCOSE-CAPILLARY: 190 mg/dL — AB (ref 65–99)
GLUCOSE-CAPILLARY: 172 mg/dL — AB (ref 65–99)
Glucose-Capillary: 150 mg/dL — ABNORMAL HIGH (ref 65–99)

## 2016-05-22 LAB — HIV ANTIBODY (ROUTINE TESTING W REFLEX): HIV Screen 4th Generation wRfx: NONREACTIVE

## 2016-05-22 MED ORDER — AMLODIPINE BESYLATE 5 MG PO TABS
5.0000 mg | ORAL_TABLET | Freq: Every day | ORAL | Status: DC
  Administered 2016-05-22 – 2016-05-23 (×2): 5 mg via ORAL
  Filled 2016-05-22 (×2): qty 1

## 2016-05-22 MED ORDER — INSULIN ASPART 100 UNIT/ML ~~LOC~~ SOLN: 0.0000 [IU] | Freq: Every day | SUBCUTANEOUS | Status: DC

## 2016-05-22 MED ORDER — IPRATROPIUM-ALBUTEROL 0.5-2.5 (3) MG/3ML IN SOLN: 3.0000 mL | RESPIRATORY_TRACT | Status: DC | PRN

## 2016-05-22 MED ORDER — BENZONATATE 100 MG PO CAPS: 100.0000 mg | ORAL_CAPSULE | Freq: Two times a day (BID) | ORAL | Status: DC | PRN

## 2016-05-22 NOTE — Progress Notes (Signed)
PROGRESS NOTE    April Fry  VWU:[id number] DOB: 07/13/1960 DOA: 05/21/2016 PCP: No PCP Per Patient   Brief Narrative:  Patient is a 56 year old presenting with pneumonia diagnosis CT angiogram of chest   Assessment & Plan:   Principal Problem:   CAP (community acquired pneumonia) - CT angiogram negative for PE but reporting findings suspicious for atypical pneumonia. -Continue Levaquin - Patient still reports dyspnea on exertion  Active Problems:   Diabetes mellitus (HCC) - Continue sliding scale insulin - Hemoglobin A1c reviewed    HTN (hypertension) - Place on amlodipine    LBBB (left bundle branch block) - stable   DVT prophylaxis: Heparin Code Status: Full Family Communication: Discussed with family member at bedside Disposition Plan: Pending improvement in conditions   Consultants:   None   Procedures: None   Antimicrobials: Levaquin   Subjective: Patient has no new complaints. No acute issues overnight. Still reporting shortness of breath with activity  Objective: Vitals:   05/21/16 2136 05/22/16 0557 05/22/16 0953 05/22/16 1405  BP: (!) 136/56 (!) 136/58  (!) 155/61  Pulse: 74 67  84  Resp: Temp: 98 F (36.7 C) 98.5 F (36.9 C)  98 F (36.7 C)  TempSrc:  Oral  Oral  SpO2: 94% 99% 100% 99%  Weight:      Height:        Intake/Output Summary (Last 24 hours) at 05/22/16 1615 Last data filed at 05/22/16 1300  Gross per 24 hour  Intake             1230 ml  Output                0 ml  Net             1230 ml   Filed Weights   05/21/16 0618  Weight: 83.7 kg (184 lb 8 oz)    Examination:  General exam: Appears calm and comfortable , In no acute distress Respiratory system: Clear to auscultation. Respiratory effort normal. Cardiovascular system: S1 & S2 heard, RRR. Marland Kitchen Gastrointestinal system: Abdomen is nondistended, soft and nontender.  Central nervous system: Alert and oriented. No focal neurological  deficits. Extremities: Symmetric 5 x 5 power, warm Skin: No rashes, lesions or ulcers, on limited exam. Psychiatry: Judgement and insight appear normal. Mood & affect appropriate.     Data Reviewed: I have personally reviewed following labs and imaging studies  CBC:  Recent Labs Lab 05/15/16 1832 05/20/16 2111 05/21/16 1053 05/22/16 0505  WBC 6.6 6.1 6.0 5.7  NEUTROABS 4.7  --   --   --   HGB 14.3 13.7 13.4 12.6  HCT 41.8 40.7 39.8 37.8  MCV 86.0 84.6 85.0 85.5  PLT 231 297 272 265   Basic Metabolic Panel:  Recent Labs Lab 05/15/16 1832 05/20/16 2111 05/21/16 1053 05/22/16 0505  NA 135 135  --  138  K 3.7 3.9  --  3.6  CL 104 105  --  106  CO2 21* 24  --  25  GLUCOSE 199* 185*  --  145*  BUN 7 12  --  6  CREATININE 0.47 0.50 0.45 0.44  CALCIUM 8.7* 8.8*  --  8.6*   GFR: Estimated Creatinine Clearance: 75.4 mL/min (by C-G formula based on SCr of 0.44 mg/dL). Liver Function Tests:  Recent Labs Lab 05/15/16 1832  AST 24  ALT 27  ALKPHOS 94  BILITOT 0.6  PROT 8.2*  ALBUMIN 3.9   No  results for input(s): LIPASE, AMYLASE in the last 168 hours. No results for input(s): AMMONIA in the last 168 hours. Coagulation Profile: No results for input(s): INR, PROTIME in the last 168 hours. Cardiac Enzymes:  Recent Labs Lab 05/21/16 0223 05/21/16 1434  TROPONINI <0.03 <0.03   BNP (last 3 results) No results for input(s): PROBNP in the last 8760 hours. HbA1C:  Recent Labs  05/21/16 1053  HGBA1C 9.2*   CBG:  Recent Labs Lab 05/21/16 1208 05/21/16 1649 05/21/16 2132 05/22/16 0802 05/22/16 1207  GLUCAP 173* 204* 169* 150* 190*   Lipid Profile: No results for input(s): CHOL, HDL, LDLCALC, TRIG, CHOLHDL, LDLDIRECT in the last 72 hours. Thyroid Function Tests: No results for input(s): TSH, T4TOTAL, FREET4, T3FREE, THYROIDAB in the last 72 hours. Anemia Panel: No results for input(s): VITAMINB12, FOLATE, FERRITIN, TIBC, IRON, RETICCTPCT in the last 72  hours. Sepsis Labs:  Recent Labs Lab 05/15/16 1841  LATICACIDVEN 1.08    Recent Results (from the past 240 hour(s))  Culture, blood (routine x 2) Call MD if unable to obtain prior to antibiotics being given     Status: None (Preliminary result)   Collection Time: 05/21/16 10:53 AM  Result Value Ref Range Status   Specimen Description BLOOD RIGHT ANTECUBITAL  Final   Special Requests IN PEDIATRIC BOTTLE Blood Culture adequate volume  Final   Culture NO GROWTH < 24 HOURS  Final   Report Status PENDING  Incomplete  Culture, blood (routine x 2) Call MD if unable to obtain prior to antibiotics being given     Status: None (Preliminary result)   Collection Time: 05/21/16 10:53 AM  Result Value Ref Range Status   Specimen Description BLOOD RIGHT HAND  Final   Special Requests   Final    BOTTLES DRAWN AEROBIC ONLY Blood Culture adequate volume   Culture NO GROWTH < 24 HOURS  Final   Report Status PENDING  Incomplete  Culture, sputum-assessment     Status: None   Collection Time: 05/21/16 11:47 AM  Result Value Ref Range Status   Specimen Description SPUTUM  Final   Special Requests NONE  Final   Sputum evaluation THIS SPECIMEN IS ACCEPTABLE FOR SPUTUM CULTURE  Final   Report Status 05/21/2016 FINAL  Final  Culture, respiratory (NON-Expectorated)     Status: None (Preliminary result)   Collection Time: 05/21/16 11:47 AM  Result Value Ref Range Status   Specimen Description SPUTUM  Final   Special Requests NONE Reflexed from [id number]  Final   Gram Stain   Final    FEW WBC PRESENT,BOTH PMN AND MONONUCLEAR FEW GRAM POSITIVE COCCI IN PAIRS RARE GRAM NEGATIVE RODS    Culture CULTURE REINCUBATED FOR BETTER GROWTH  Final   Report Status PENDING  Incomplete         Radiology Studies: Dg Chest 2 View  Result Date: 05/20/2016 CLINICAL DATA:  Chest pain with cough EXAM: CHEST  2 VIEW COMPARISON:  05/15/2016 CXR FINDINGS: Low lung volumes with crowding of interstitial lung markings.  Mild interstitial prominence suggestive of chronic bronchitic change. Subtle airspace opacities in the lingula and left lower lobe are less prominent on current exam. Streaky atelectasis is suggested medially at the left lung base. No alveolar consolidation, effusion nor overt pulmonary edema. No acute nor suspicious osseous abnormalities. Aortic atherosclerosis the arch. Borderline cardiomegaly. IMPRESSION: Mild chronic bronchitic change of the lungs. Aortic atherosclerosis. Electronically Signed   By: Tollie Eth M.D.   On: 05/20/2016 21:51   Ct  Angio Chest Pe W And/or Wo Contrast  Result Date: 05/21/2016 CLINICAL DATA:  Acute onset of shortness of breath and generalized chest pain. Initial encounter. EXAM: CT ANGIOGRAPHY CHEST WITH CONTRAST TECHNIQUE: Multidetector CT imaging of the chest was performed using the standard protocol during bolus administration of intravenous contrast. Multiplanar CT image reconstructions and MIPs were obtained to evaluate the vascular anatomy. CONTRAST:  61 mL of Isovue 370 IV contrast COMPARISON:  Chest radiograph performed 05/20/2016 FINDINGS: Cardiovascular:  There is no evidence of pulmonary embolus. The heart is normal in size. The thoracic aorta is grossly unremarkable, aside from minimal calcification. The great vessels are within normal limits. Mediastinum/Nodes: The mediastinum is otherwise unremarkable. No mediastinal lymphadenopathy is seen. No pericardial effusion is identified. The thyroid gland is unremarkable. No axillary lymphadenopathy is appreciated. Lungs/Pleura: Nodular opacity at the left lung base raises concern for atypical pneumonia. No pleural effusion or pneumothorax is seen. No dominant mass is identified. Upper Abdomen: The visualized portions of the liver and spleen are grossly unremarkable. The visualized portions of the gallbladder, pancreas and adrenal glands are within normal limits. A tiny hiatal hernia is seen. A nonspecific 9 mm node is noted  adjacent to the distal esophagus. Musculoskeletal: No acute osseous abnormalities are identified. The visualized musculature is unremarkable in appearance. Review of the MIP images confirms the above findings. IMPRESSION: 1. No evidence of pulmonary embolus. 2. Nodular airspace opacity at the left lung base raises concern for atypical pneumonia. 3. Tiny hiatal hernia. 4. Nonspecific 9 mm node adjacent to the distal esophagus. Electronically Signed   By: Roanna Raider M.D.   On: 05/21/2016 04:47    Scheduled Meds: . guaiFENesin  600 mg Oral BID  . heparin  5,000 Units Subcutaneous Q8H  . insulin aspart  0-20 Units Subcutaneous TID WC  . levofloxacin (LEVAQUIN) IV  750 mg Intravenous Q24H  . sodium chloride flush  3 mL Intravenous Q12H   Continuous Infusions:   LOS: 1 day    Time spent: > 35 minutes  Penny Pia, MD [facility] Pager [phone]  If 7PM-7AM, please contact night-coverage [email or web address] Password [facility] 05/22/2016, 4:15 PM

## 2016-05-23 DIAGNOSIS — I10 Essential (primary) hypertension: Secondary | ICD-10-CM

## 2016-05-23 DIAGNOSIS — J189 Pneumonia, unspecified organism: Principal | ICD-10-CM

## 2016-05-23 LAB — CULTURE, RESPIRATORY

## 2016-05-23 LAB — GLUCOSE, CAPILLARY
GLUCOSE-CAPILLARY: 146 mg/dL — AB (ref 65–99)
Glucose-Capillary: 228 mg/dL — ABNORMAL HIGH (ref 65–99)

## 2016-05-23 LAB — CULTURE, RESPIRATORY W GRAM STAIN: Culture: NORMAL

## 2016-05-23 MED ORDER — LIVING WELL WITH DIABETES BOOK - IN SPANISH: 1.0000 | Freq: Once | 0 refills | Status: AC

## 2016-05-23 MED ORDER — BLOOD GLUCOSE MONITOR KIT: PACK | 0 refills | Status: AC

## 2016-05-23 MED ORDER — LIVING WELL WITH DIABETES BOOK
Freq: Once | Status: AC
  Administered 2016-05-23: 15:00:00
  Filled 2016-05-23: qty 1

## 2016-05-23 MED ORDER — METFORMIN HCL 1000 MG PO TABS: 1000.0000 mg | ORAL_TABLET | Freq: Two times a day (BID) | ORAL | 0 refills | Status: DC

## 2016-05-23 MED ORDER — AMLODIPINE BESYLATE 5 MG PO TABS: 5.0000 mg | ORAL_TABLET | Freq: Every day | ORAL | 0 refills | Status: DC

## 2016-05-23 MED ORDER — LEVOFLOXACIN 750 MG PO TABS: 750.0000 mg | ORAL_TABLET | Freq: Every day | ORAL | Status: DC

## 2016-05-23 MED ORDER — LEVOFLOXACIN 750 MG PO TABS
750.0000 mg | ORAL_TABLET | Freq: Every day | ORAL | 0 refills | Status: AC
Start: 2016-05-24 — End: 2016-05-26

## 2016-05-23 NOTE — Progress Notes (Signed)
April Fry to be D/C'd Home per MD order.  Discussed with the patient and all questions fully answered. Translator used.  VSS. IV catheter discontinued intact. Site without signs and symptoms of complications. Dressing and pressure applied.  An After Visit Summary was printed and given to the patient. Patient received prescription.  D/c education completed with patient/family including follow up instructions, medication list, d/c activities limitations if indicated, with other d/c instructions as indicated by MD - patient able to verbalize understanding, all questions fully answered.   Patient instructed to return to ED, call 911, or call MD for any changes in condition.   Patient to be escorted via WC, and D/C home via private auto.  Wilfred Dayrit C 05/23/2016 2:39 PM

## 2016-05-23 NOTE — Progress Notes (Signed)
Patient ambulated in hallway with RN on room air Sp02 99%. Patient denied shortness of breath and tolerated ambulation well.

## 2016-05-23 NOTE — Progress Notes (Signed)
PHARMACIST - PHYSICIAN COMMUNICATION  CONCERNING: Antibiotic IV to Oral Route Change Policy  RECOMMENDATION: This patient is receiving Levaquin by the intravenous route.  Based on criteria approved by the Pharmacy and Therapeutics Committee, the antibiotic(s) is/are being converted to the equivalent oral dose form(s).   DESCRIPTION: These criteria include:  Patient being treated for a respiratory tract infection, urinary tract infection, cellulitis or clostridium difficile associated diarrhea if on metronidazole  The patient is not neutropenic and does not exhibit a GI malabsorption state  The patient is eating (either orally or via tube) and/or has been taking other orally administered medications for a least 24 hours  The patient is improving clinically and has a Tmax < 100.5  If you have questions about this conversion, please contact the Pharmacy Department    [phone] )  [facility]   [phone] )  [facility]   [phone] )  [facility]   [phone] )  [facility]   [phone] )  [facility]   Harland German, [place] D 05/23/2016 2:03 PM

## 2016-05-23 NOTE — Discharge Summary (Addendum)
Physician Discharge Summary  April Fry CZY:[id number] DOB: [birth year] DOA: 05/21/2016  PCP: No PCP Per Patient  Admit date: 05/21/2016 Discharge date: 05/23/2016  Time spent: > 35 minutes  Recommendations for Outpatient Follow-up:  1. Please ensure patient completes antibiotic regimen. Needs 2 more days to complete a five-day treatment course of high-dose Levaquin. 2. Monitor blood sugars and adjust hypoglycemic agents accordingly 3. Essential hypertension added amlodipine   Discharge Diagnoses:  Principal Problem:   CAP (community acquired pneumonia) Active Problems:   Diabetes mellitus ([place])   HTN (hypertension)   LBBB (left bundle branch block)   Discharge Condition: stable  Diet recommendation: carb modified diet  Filed Weights   05/21/16 0618  Weight: 83.7 kg (184 lb 8 oz)    History of present illness:  56 year old with hypertension and DM type II who presented with pneumonia.  Hospital Course:  Pneumonia - Improved on Levaquin. Will discharge on 2 more days of Levaquin to complete a five-day treatment course  Diabetes mellitus -Discharge on metformin and recommend diabetic diet - Recommend patient follow-up with memory care physician. Discharge instructions written in Spanish indicating patient has elevated blood sugars and needs to follow-up with primary care physician within the next week.  Essential hypertension - Place patient on amlodipine  Procedures:  None  Consultations:  None  Discharge Exam: Vitals:   05/23/16 0531 05/23/16 1419  BP: 132/62 117/72  Pulse: 76 78  Resp: 18 19  Temp: 98.1 F (36.7 C) 98.2 F (36.8 C)    General: Pt in nad, alert and awake Cardiovascular: rrr, no rubs Respiratory: no increased wob, breathing comfortably on room air, equal chest rise  Discharge Instructions   Discharge Instructions    Call MD for:  difficulty breathing, headache or visual disturbances    Complete by:  As directed    Call MD  for:  severe uncontrolled pain    Complete by:  As directed    Call MD for:  temperature >100.4    Complete by:  As directed    Diet Carb Modified    Complete by:  As directed    Discharge instructions    Complete by:  As directed    Por favor de sacar sita para ver a su doctor primario. Tienes laboratorios y evidencia q desarrollaste diabetes. Vas a Solicitor para Theatre manager tus niveles de Chief of Staff en las sangre bajo control.  Te recomiendo q veas tu doctor primario dentro de una semana   Increase activity slowly    Complete by:  As directed      Current Discharge Medication List    START taking these medications   Details  amLODipine (NORVASC) 5 MG tablet Take 1 tablet (5 mg total) by mouth daily. Qty: 30 tablet, Refills: 0    blood glucose meter kit and supplies KIT Dispense based on patient and insurance preference. Use up to four times daily as directed. (FOR ICD-9 250.00, 250.01). Qty: 1 each, Refills: 0    levofloxacin (LEVAQUIN) 750 MG tablet Take 1 tablet (750 mg total) by mouth daily. Qty: 2 tablet, Refills: 0    living well with diabetes book- in [place] 1 each by Does not apply route once. Qty: 1 each, Refills: 0    metFORMIN (GLUCOPHAGE) 1000 MG tablet Take 1 tablet (1,000 mg total) by mouth 2 (two) times daily with a meal. Qty: 60 tablet, Refills: 0      CONTINUE these medications which have NOT CHANGED   Details  benzonatate (  TESSALON PERLES) 100 MG capsule Take 1 capsule (100 mg total) by mouth 3 (three) times daily as needed for cough. Qty: 20 capsule, Refills: 0    ibuprofen (ADVIL,MOTRIN) 200 MG tablet Take 400 mg by mouth every 6 (six) hours as needed.       No Known Allergies Follow-up Information    [facility]. Go on 05/28/2016.   Why:  Please go to the clinic on Thursday at 8:00 am and either be seen or have them make an appt for you. Ask for 1) Primary Care Physician, 2) Hospital Follow up 3) assistance  with medications and 4) Navigator to assist with Insurance.  Contact information: [address] [place] [address] [phone]           The results of significant diagnostics from this hospitalization (including imaging, microbiology, ancillary and laboratory) are listed below for reference.    Significant Diagnostic Studies: Dg Chest 2 View  Result Date: 05/20/2016 CLINICAL DATA:  Chest pain with cough EXAM: CHEST  2 VIEW COMPARISON:  05/15/2016 CXR FINDINGS: Low lung volumes with crowding of interstitial lung markings. Mild interstitial prominence suggestive of chronic bronchitic change. Subtle airspace opacities in the lingula and left lower lobe are less prominent on current exam. Streaky atelectasis is suggested medially at the left lung base. No alveolar consolidation, effusion nor overt pulmonary edema. No acute nor suspicious osseous abnormalities. Aortic atherosclerosis the arch. Borderline cardiomegaly. IMPRESSION: Mild chronic bronchitic change of the lungs. Aortic atherosclerosis. Electronically Signed   By: Ashley Royalty M.D.   On: 05/20/2016 21:51   Dg Chest 2 View  Result Date: 05/15/2016 CLINICAL DATA:  Cough, mildly is EXAM: CHEST  2 VIEW COMPARISON:  None. FINDINGS: Right lung is grossly clear. There is ill-defined infiltrate within the lingula and left lower lobe. No pleural effusion. Normal heart size. No pneumothorax. IMPRESSION: Ill-defined lingular and left lower lobe infiltrate. Electronically Signed   By: Donavan Foil M.D.   On: 05/15/2016 19:05   Ct Angio Chest Pe W And/or Wo Contrast  Result Date: 05/21/2016 CLINICAL DATA:  Acute onset of shortness of breath and generalized chest pain. Initial encounter. EXAM: CT ANGIOGRAPHY CHEST WITH CONTRAST TECHNIQUE: Multidetector CT imaging of the chest was performed using the standard protocol during bolus administration of intravenous contrast. Multiplanar CT image reconstructions and MIPs were  obtained to evaluate the vascular anatomy. CONTRAST:  61 mL of Isovue 370 IV contrast COMPARISON:  Chest radiograph performed 05/20/2016 FINDINGS: Cardiovascular:  There is no evidence of pulmonary embolus. The heart is normal in size. The thoracic aorta is grossly unremarkable, aside from minimal calcification. The great vessels are within normal limits. Mediastinum/Nodes: The mediastinum is otherwise unremarkable. No mediastinal lymphadenopathy is seen. No pericardial effusion is identified. The thyroid gland is unremarkable. No axillary lymphadenopathy is appreciated. Lungs/Pleura: Nodular opacity at the left lung base raises concern for atypical pneumonia. No pleural effusion or pneumothorax is seen. No dominant mass is identified. Upper Abdomen: The visualized portions of the liver and spleen are grossly unremarkable. The visualized portions of the gallbladder, pancreas and adrenal glands are within normal limits. A tiny hiatal hernia is seen. A nonspecific 9 mm node is noted adjacent to the distal esophagus. Musculoskeletal: No acute osseous abnormalities are identified. The visualized musculature is unremarkable in appearance. Review of the MIP images confirms the above findings. IMPRESSION: 1. No evidence of pulmonary embolus. 2. Nodular airspace opacity at the left lung base raises concern for  atypical pneumonia. 3. Tiny hiatal hernia. 4. Nonspecific 9 mm node adjacent to the distal esophagus. Electronically Signed   By: Garald Balding M.D.   On: 05/21/2016 04:47    Microbiology: Recent Results (from the past 240 hour(s))  Culture, blood (routine x 2) Call MD if unable to obtain prior to antibiotics being given     Status: None (Preliminary result)   Collection Time: 05/21/16 10:53 AM  Result Value Ref Range Status   Specimen Description BLOOD RIGHT ANTECUBITAL  Final   Special Requests IN PEDIATRIC BOTTLE Blood Culture adequate volume  Final   Culture NO GROWTH 2 DAYS  Final   Report Status  PENDING  Incomplete  Culture, blood (routine x 2) Call MD if unable to obtain prior to antibiotics being given     Status: None (Preliminary result)   Collection Time: 05/21/16 10:53 AM  Result Value Ref Range Status   Specimen Description BLOOD RIGHT HAND  Final   Special Requests   Final    BOTTLES DRAWN AEROBIC ONLY Blood Culture adequate volume   Culture NO GROWTH 2 DAYS  Final   Report Status PENDING  Incomplete  Culture, sputum-assessment     Status: None   Collection Time: 05/21/16 11:47 AM  Result Value Ref Range Status   Specimen Description SPUTUM  Final   Special Requests NONE  Final   Sputum evaluation THIS SPECIMEN IS ACCEPTABLE FOR SPUTUM CULTURE  Final   Report Status 05/21/2016 FINAL  Final  Culture, respiratory (NON-Expectorated)     Status: None (Preliminary result)   Collection Time: 05/21/16 11:47 AM  Result Value Ref Range Status   Specimen Description SPUTUM  Final   Special Requests NONE Reflexed from [id number]  Final   Gram Stain   Final    FEW WBC PRESENT,BOTH PMN AND MONONUCLEAR FEW GRAM POSITIVE COCCI IN PAIRS RARE GRAM NEGATIVE RODS    Culture CULTURE REINCUBATED FOR BETTER GROWTH  Final   Report Status PENDING  Incomplete     Labs: Basic Metabolic Panel:  Recent Labs Lab 05/20/16 2111 05/21/16 1053 05/22/16 0505  NA 135  --  138  K 3.9  --  3.6  CL 105  --  106  CO2 24  --  25  GLUCOSE 185*  --  145*  BUN 12  --  6  CREATININE 0.50 0.45 0.44  CALCIUM 8.8*  --  8.6*   Liver Function Tests: No results for input(s): AST, ALT, ALKPHOS, BILITOT, PROT, ALBUMIN in the last 168 hours. No results for input(s): LIPASE, AMYLASE in the last 168 hours. No results for input(s): AMMONIA in the last 168 hours. CBC:  Recent Labs Lab 05/20/16 2111 05/21/16 1053 05/22/16 0505  WBC 6.1 6.0 5.7  HGB 13.7 13.4 12.6  HCT 40.7 39.8 37.8  MCV 84.6 85.0 85.5  PLT 297 272 265   Cardiac Enzymes:  Recent Labs Lab 05/21/16 0223 05/21/16 1434   TROPONINI <0.03 <0.03   BNP: BNP (last 3 results) No results for input(s): BNP in the last 8760 hours.  ProBNP (last 3 results) No results for input(s): PROBNP in the last 8760 hours.  CBG:  Recent Labs Lab 05/22/16 1207 05/22/16 1712 05/22/16 2104 05/23/16 0809 05/23/16 1254  GLUCAP 190* 172* 150* 228* 146*    Signed:  Velvet Bathe MD.  [facility] 05/23/2016, 2:27 PM

## 2016-05-23 NOTE — Care Management Note (Addendum)
Case Management Note  Patient Details  Name: April Fry MRN: [id number] Date of Birth: Jan 06, 1961  Subjective/Objective:   CM received consult for this 56 yo F in reference  to assist with medication and need for PCP. Pt is Spanish speaking yet daughter at bedside able to translate for the pt. Referred to [facility] Open hours. Provided with pamphlet and instructed to attend appt and take list of medications and current ID. No questions voiced. Pt understands she can receive assist with medications, PCP, Insurance and hospital follow up.   Pt provided with GoodRx coupon for #2 tablets Augmentin to offset costs ($18. vs $57.00) Metformin and Amlodipine will be $4.00 at [facility]          Action/Plan: CM will sign off for now.    Expected Discharge Date:                  Expected Discharge Plan:  Home/Self Care  In-House Referral:  NA  Discharge planning Services  CM Consult, Medication Assistance, Indigent Health Clinic (Open Hours at [facility]; Meds are affordable until seen [facility])  Post Acute Care Choice:  NA Choice offered to:  Adult Children (Spanish Speaking; spoke with Daughter at bedside [place])  DME Arranged:  N/A DME Agency:  NA  HH Arranged:  NA HH Agency:  NA  Status of Service:  Completed, signed off  If discussed at Long Length of Stay Meetings, dates discussed:    Additional Comments:  Yvone Neu, RN 05/23/2016, 12:15 PM

## 2016-05-26 LAB — CULTURE, BLOOD (ROUTINE X 2)
CULTURE: NO GROWTH
CULTURE: NO GROWTH
SPECIAL REQUESTS: ADEQUATE
SPECIAL REQUESTS: ADEQUATE

## 2016-06-03 ENCOUNTER — Encounter: Payer: Self-pay | Admitting: Critical Care Medicine

## 2016-06-03 ENCOUNTER — Ambulatory Visit (HOSPITAL_COMMUNITY)
Admission: RE | Admit: 2016-06-03 | Discharge: 2016-06-03 | Disposition: A | Discharge status: Home / Self Care | Payer: Self-pay | Source: Ambulatory Visit | Attending: Critical Care Medicine | Admitting: Critical Care Medicine

## 2016-06-03 ENCOUNTER — Ambulatory Visit: Payer: Self-pay | Attending: Critical Care Medicine | Admitting: Critical Care Medicine

## 2016-06-03 VITALS — BP 141/70 | HR 76 | Temp 98.9°F | Resp 18 | Ht 60.0 in | Wt 189.6 lb

## 2016-06-03 DIAGNOSIS — I10 Essential (primary) hypertension: Secondary | ICD-10-CM

## 2016-06-03 DIAGNOSIS — E138 Other specified diabetes mellitus with unspecified complications: Secondary | ICD-10-CM

## 2016-06-03 DIAGNOSIS — J181 Lobar pneumonia, unspecified organism: Secondary | ICD-10-CM

## 2016-06-03 DIAGNOSIS — J189 Pneumonia, unspecified organism: Secondary | ICD-10-CM

## 2016-06-03 DIAGNOSIS — E119 Type 2 diabetes mellitus without complications: Secondary | ICD-10-CM | POA: Insufficient documentation

## 2016-06-03 DIAGNOSIS — I447 Left bundle-branch block, unspecified: Secondary | ICD-10-CM

## 2016-06-03 DIAGNOSIS — Z794 Long term (current) use of insulin: Secondary | ICD-10-CM | POA: Insufficient documentation

## 2016-06-03 DIAGNOSIS — I2510 Atherosclerotic heart disease of native coronary artery without angina pectoris: Secondary | ICD-10-CM | POA: Insufficient documentation

## 2016-06-03 DIAGNOSIS — R918 Other nonspecific abnormal finding of lung field: Secondary | ICD-10-CM | POA: Insufficient documentation

## 2016-06-03 LAB — GLUCOSE, POCT (MANUAL RESULT ENTRY): POC Glucose: 243 mg/dl — AB (ref 70–99)

## 2016-06-03 MED ORDER — METFORMIN HCL 1000 MG PO TABS: 1000.0000 mg | ORAL_TABLET | Freq: Two times a day (BID) | ORAL | 6 refills | Status: DC

## 2016-06-03 MED ORDER — AMLODIPINE BESYLATE 5 MG PO TABS: 5.0000 mg | ORAL_TABLET | Freq: Every day | ORAL | 6 refills | Status: DC

## 2016-06-03 NOTE — Assessment & Plan Note (Signed)
On going chest pain, neg trop in hosp LBBB ?CAD Plan check Echo Poss Cards consult

## 2016-06-03 NOTE — Assessment & Plan Note (Addendum)
CAP nos, now resolving  Note CXR CLEAR Plan No further ABX

## 2016-06-03 NOTE — Assessment & Plan Note (Signed)
BP well controlled  Cont amlodipine

## 2016-06-03 NOTE — Progress Notes (Addendum)
Subjective:    Patient ID: April Fry, female    DOB: 12-18-1960, 56 y.o.   MRN: [id number]  56 y.o. F with hx of CAP 56 year old with hypertension and DM type II who presented with pneumonia.  Previously had care for DM [place] [address] with Dr Huey Bienenstock, one year ago.  Adm 4/12, d/c 4/14 56 y.o. female with medical history significant of HTN and DM type II was diagnosed with PNA on 05/15/2016, placed on Zithromax and sent home from Ed.  She accomplished the course of Zithromax, but still has not improved much. Today patient returned to the ED with c/o worsening cough productive of bloody tinged sputum, SOB, chest pain with coughing and fevers, the highest 100.  Hospital Course:  Pneumonia - Improved on Levaquin. Will discharge on 2 more days of Levaquin to complete a five-day treatment course  Diabetes mellitus -Discharge on metformin and recommend diabetic diet - Recommend patient follow-up with memory care physician. Discharge instructions written in Spanish indicating patient has elevated blood sugars and needs to follow-up with primary care physician within the next week.  Essential hypertension - Place patient on amlodipine  Pt here for f/u.  Interviewed with tele interpreter services   BS today 243    Shortness of Breath  This is a new problem. The current episode started 1 to 4 weeks ago. The problem occurs daily (exertion only). The problem has been gradually improving. Associated symptoms include chest pain, headaches, leg swelling, orthopnea, PND, rhinorrhea, a sore throat, sputum production and wheezing. Pertinent negatives include no fever, hemoptysis, rash or vomiting. Associated symptoms comments: No GERD Mucus is clear but Still with cough . Her past medical history is significant for pneumonia. There is no history of asthma, CAD, COPD, DVT, a heart failure, PE or a recent surgery.   Past Medical History:  Diagnosis Date  . Diabetes mellitus without complication  (April Fry)   . Hypertension   . Pneumonia 05/21/2016     No family history on file.   Social History   Social History  . Marital status: Single    Spouse name: N/A  . Number of children: N/A  . Years of education: N/A   Occupational History  . Not on file.   Social History Main Topics  . Smoking status: Never Smoker  . Smokeless tobacco: Never Used  . Alcohol use No  . Drug use: No  . Sexual activity: Not on file   Other Topics Concern  . Not on file   Social History Narrative  . No narrative on file     No Known Allergies   Outpatient Medications Prior to Visit  Medication Sig Dispense Refill  . blood glucose meter kit and supplies KIT Dispense based on patient and insurance preference. Use up to four times daily as directed. (FOR ICD-9 250.00, 250.01). 1 each 0  . ibuprofen (ADVIL,MOTRIN) 200 MG tablet Take 400 mg by mouth every 6 (six) hours as needed.    Marland Kitchen amLODipine (NORVASC) 5 MG tablet Take 1 tablet (5 mg total) by mouth daily. 30 tablet 0  . benzonatate (TESSALON PERLES) 100 MG capsule Take 1 capsule (100 mg total) by mouth 3 (three) times daily as needed for cough. (Patient not taking: Reported on 06/03/2016) 20 capsule 0  . metFORMIN (GLUCOPHAGE) 1000 MG tablet Take 1 tablet (1,000 mg total) by mouth 2 (two) times daily with a meal. 60 tablet 0   No facility-administered medications prior to visit.  Review of Systems  Constitutional: Negative for fever.  HENT: Positive for rhinorrhea and sore throat.   Respiratory: Positive for sputum production, shortness of breath and wheezing. Negative for hemoptysis.   Cardiovascular: Positive for chest pain, orthopnea, leg swelling and PND.  Gastrointestinal: Negative for vomiting.  Skin: Negative for rash.  Neurological: Positive for headaches.       Objective:   Physical Exam Vitals:   06/03/16 0942  BP: (!) 141/70  Pulse: 76  Resp: 18  Temp: 98.9 F (37.2 C)  TempSrc: Oral  SpO2: 97%  Weight: 189  lb 9.6 oz (86 kg)  Height: 5' (1.524 m)    Gen: Pleasant, obese F, in no distress,  normal affect  ENT: No lesions,  mouth clear,  oropharynx clear, no postnasal drip  Neck: No JVD, no TMG, no carotid bruits  Lungs: No use of accessory muscles, no dullness to percussion, clear without rales or rhonchi  Cardiovascular: RRR, heart sounds normal, no murmur or gallops, no peripheral edema  Abdomen: soft and NT, no HSM,  BS normal  Musculoskeletal: No deformities, no cyanosis or clubbing  Neuro: alert, non focal  Skin: Warm, no lesions or rashes  Dg Chest 2 View  Result Date: 06/03/2016 CLINICAL DATA:  Left lung pneumonia.  Cough and fever. EXAM: CHEST  2 VIEW COMPARISON:  CT 05/21/2016 . FINDINGS: Mediastinum hilar structures normal. Heart size normal. Pulmonary vascularity normal. Interval clearing of left base infiltrate. No pleural effusion or pneumothorax. No acute bony abnormality. Degenerative changes thoracic spine. IMPRESSION: Interim clearing of previously identified left base infiltrate. Electronically Signed   By: Marcello Moores  Register   On: 06/03/2016 12:53     All labs from hosp reviewed BMP Latest Ref Rng & Units 05/22/2016 05/21/2016 05/20/2016  Glucose 65 - 99 mg/dL 145(H) - 185(H)  BUN 6 - 20 mg/dL 6 - 12  Creatinine 0.44 - 1.00 mg/dL 0.44 0.45 0.50  Sodium 135 - 145 mmol/L 138 - 135  Potassium 3.5 - 5.1 mmol/L 3.6 - 3.9  Chloride 101 - 111 mmol/L 106 - 105  CO2 22 - 32 mmol/L 25 - 24  Calcium 8.9 - 10.3 mg/dL 8.6(L) - 8.8(L)   Lab Results  Component Value Date   WBC 5.7 05/22/2016   HGB 12.6 05/22/2016   HCT 37.8 05/22/2016   MCV 85.5 05/22/2016   PLT 265 05/22/2016   CT Chest reviewed:  LLL nodular infiltrate     Assessment & Plan:  I personally reviewed all images and lab data in the [facility] system as well as any outside material available during this office visit and agree with the  radiology impressions.   CAP (community acquired pneumonia) CAP nos, now  resolving  Note CXR CLEAR Plan No further ABX   LBBB (left bundle branch block) On going chest pain, neg trop in hosp LBBB ?CAD Plan check Echo Poss Cards consult  Diabetes mellitus ([place]) DM type II HgbA1c 9  Plan  DM education consult Cont metformin   HTN (hypertension) BP well controlled Cont amlodipine   Diagnoses and all orders for this visit:  Community acquired pneumonia of left lower lobe of lung ([place]) -     DG Chest 2 View; Future  Diabetes mellitus of other type with complication, unspecified whether long term insulin use (HCC) -     Glucose (CBG)  LBBB (left bundle branch block) -     ECHOCARDIOGRAM COMPLETE; Future  Essential hypertension  Other orders -  metFORMIN (GLUCOPHAGE) 1000 MG tablet; Take 1 tablet (1,000 mg total) by mouth 2 (two) times daily with a meal. -     amLODipine (NORVASC) 5 MG tablet; Take 1 tablet (5 mg total) by mouth daily.    I had an extended discussion with the patient and or family lasting 77mnutes of a 25 minute visit including:  Discussion of pts HTN DM control need to stay on metformin and amlodipine

## 2016-06-03 NOTE — Patient Instructions (Signed)
Stay on metformin and amlodipine A chest xray will be obtained An Echocardiogram will be obtained A diabetic diet education session will be offered We will establish with primary care here

## 2016-06-03 NOTE — Progress Notes (Signed)
Patient is here for H  f/up CAP   Patient has taking her insulin this morning  Patient has eaten for today  Patient still has cough & headaches

## 2016-06-03 NOTE — Assessment & Plan Note (Signed)
DM type II HgbA1c 9  Plan  DM education consult Cont metformin

## 2016-06-09 ENCOUNTER — Other Ambulatory Visit (HOSPITAL_COMMUNITY): Payer: Self-pay

## 2016-06-10 NOTE — Progress Notes (Signed)
Of course!

## 2016-06-11 ENCOUNTER — Ambulatory Visit: Payer: Self-pay | Attending: Family Medicine | Admitting: Pharmacist

## 2016-06-11 ENCOUNTER — Encounter: Payer: Self-pay | Admitting: Family Medicine

## 2016-06-11 ENCOUNTER — Ambulatory Visit (HOSPITAL_COMMUNITY)
Admission: RE | Admit: 2016-06-11 | Discharge: 2016-06-11 | Disposition: A | Discharge status: Home / Self Care | Payer: Self-pay | Source: Ambulatory Visit | Attending: Critical Care Medicine | Admitting: Critical Care Medicine

## 2016-06-11 ENCOUNTER — Ambulatory Visit (HOSPITAL_BASED_OUTPATIENT_CLINIC_OR_DEPARTMENT_OTHER): Payer: Self-pay | Admitting: Family Medicine

## 2016-06-11 ENCOUNTER — Ambulatory Visit: Payer: Self-pay

## 2016-06-11 VITALS — BP 149/78 | HR 71 | Temp 98.2°F | Resp 18 | Ht 60.0 in | Wt 187.4 lb

## 2016-06-11 DIAGNOSIS — I071 Rheumatic tricuspid insufficiency: Secondary | ICD-10-CM | POA: Insufficient documentation

## 2016-06-11 DIAGNOSIS — I447 Left bundle-branch block, unspecified: Secondary | ICD-10-CM | POA: Insufficient documentation

## 2016-06-11 DIAGNOSIS — I10 Essential (primary) hypertension: Secondary | ICD-10-CM

## 2016-06-11 DIAGNOSIS — I340 Nonrheumatic mitral (valve) insufficiency: Secondary | ICD-10-CM | POA: Insufficient documentation

## 2016-06-11 DIAGNOSIS — Z7984 Long term (current) use of oral hypoglycemic drugs: Secondary | ICD-10-CM | POA: Insufficient documentation

## 2016-06-11 DIAGNOSIS — Z79899 Other long term (current) drug therapy: Secondary | ICD-10-CM | POA: Insufficient documentation

## 2016-06-11 DIAGNOSIS — E119 Type 2 diabetes mellitus without complications: Secondary | ICD-10-CM | POA: Insufficient documentation

## 2016-06-11 DIAGNOSIS — E138 Other specified diabetes mellitus with unspecified complications: Secondary | ICD-10-CM

## 2016-06-11 DIAGNOSIS — Z23 Encounter for immunization: Secondary | ICD-10-CM

## 2016-06-11 DIAGNOSIS — Z0000 Encounter for general adult medical examination without abnormal findings: Secondary | ICD-10-CM

## 2016-06-11 LAB — GLUCOSE, POCT (MANUAL RESULT ENTRY): POC GLUCOSE: 106 mg/dL — AB (ref 70–99)

## 2016-06-11 LAB — POCT UA - MICROALBUMIN
Albumin/Creatinine Ratio, Urine, POC: <30
Creatinine, POC: 200 mg/dL
MICROALBUMIN (UR) POC: 30 mg/L

## 2016-06-11 MED ORDER — GLUCOSE BLOOD VI STRP
ORAL_STRIP | 12 refills | Status: DC
Start: 2016-06-11 — End: 2017-12-16

## 2016-06-11 MED ORDER — LOSARTAN POTASSIUM 50 MG PO TABS: 50.0000 mg | ORAL_TABLET | Freq: Every day | ORAL | 2 refills | Status: DC

## 2016-06-11 MED ORDER — TRUEPLUS LANCETS 28G MISC: 1.0000 | Freq: Once | 12 refills | Status: AC

## 2016-06-11 MED ORDER — TRUE METRIX METER W/DEVICE KIT: 1.0000 | PACK | Freq: Once | 0 refills | Status: AC

## 2016-06-11 NOTE — Progress Notes (Signed)
    S:     No chief complaint on file.   Patient arrives in good spirits with her daughter and grandchildren.  Presents for diabetes education at the request of Dr. Delford FieldWright. Patient was referred on 06/03/16.  Patient was last seen by Primary Care Provider on 06/03/16. Interpreter [phone] was used for the entirety of the visit.  Patient reports adherence with medications.  Current diabetes medications include: metformin  Patient denies hypoglycemic events.  Patient reported dietary habits: Eats 3 meals/day. Mostly avoids carbs during meals but does have some bread and also drinks a regular coke. She would like to be able to eat cake when she goes to parties.  Patient reported exercise habits: none   Patient denies nocturia.  Patient denies neuropathy. Patient denies visual changes. Patient reports self foot exams.   O:  Physical Exam   ROS   Lab Results  Component Value Date   HGBA1C 9.2 (H) 05/21/2016   There were no vitals filed for this visit.  Home fasting CBG: 100s  A/P: Diabetes longstanding currently UNcontrolled based on A1c of 9.2 . Patient denies hypoglycemic events and is able to verbalize appropriate hypoglycemia management plan. Patient reports adherence with medication. Control is suboptimal due to poor understanding of diabetes.  Provided diabetes education on what diabetes is, what the A1c is, goal blood sugars, complications of diabetes, medications, and lifestyle changes. Focused on maintaining a low carb diet but allowing herself to eat cake at parties on occasion. All patient questions were addressed.   Next A1C anticipated July 2018.   Written patient instructions provided.  Total time in face to face counseling 30  minutes.   Follow up in Pharmacist Clinic Visit PRN.   Patient seen with Susy FrizzleNicole Dunn, PharmD Candidate

## 2016-06-11 NOTE — Patient Instructions (Addendum)
Schedule follow up appointment with PCP in month. You will be called with your labs results. Take blood sugars twice a day and keep log to bring to next office visit with glucometer.   Losartan tablets Qu es este medicamento? El LOSARTN se [place] para tratar la alta presin sangunea y en ciertos pacientes reduce el riesgo de padecer derrame cerebral. Este medicamento tambin disminuye la evolucin de la enfermedad renal en pacientes diabticos. Este medicamento puede ser utilizado para otros usos; si tiene alguna pregunta consulte con su proveedor de atencin mdica o con su farmacutico. MARCAS COMUNES: Cozaar Qu le debo informar a mi profesional de la salud antes de tomar este medicamento? Necesita saber si usted presenta alguno de los siguientes problemas o situaciones: -insuficiencia cardiaca -enfermedad renal o heptica -una reaccin alrgica o inusual al losartn, a otros medicamentos, alimentos, colorantes o conservantes -si est embarazada o buscando quedar embarazada -si est amamantando a un beb Cmo debo utilizar este medicamento? Tome este medicamento por va oral con un vaso de agua. Siga las instrucciones de la etiqueta del [place]. Este medicamento se puede tomar con o sin alimentos. Tome sus dosis a intervalos regulares. No tome su medicamento con una frecuencia mayor a la indicada. Hable con su pediatra para informarse acerca del uso de este medicamento en nios. Puede requerir atencin especial. Sobredosis: Pngase en contacto inmediatamente con un centro toxicolgico o una sala de urgencia si usted cree que haya tomado demasiado medicamento. ATENCIN: [employer] es solo para usted. No comparta este medicamento con nadie. Qu sucede si me olvido de una dosis? Si olvida una dosis, tmela lo antes posible. Si es casi la hora de la prxima dosis, tome slo esa dosis. No tome dosis adicionales o dobles. Qu puede interactuar con este  medicamento? -medicamentos para la presin sangunea -diurticos, especialmente triamtereno, espironolactona o amilorida -fluconazol -los AINE, medicamentos para el dolor o inflamacin, como ibuprofeno o naproxeno -sales o suplementos de potasio -rifampicina Puede ser que esta lista no menciona todas las posibles interacciones. Informe a su profesional de [employer] de [employer] productos a base de hierbas, medicamentos de [place] o suplementos nutritivos que est tomando. Si usted fuma, consume bebidas alcohlicas o si utiliza drogas ilegales, indqueselo tambin a su profesional de [employer]. Algunas sustancias pueden interactuar con su medicamento. A qu debo estar atento al usar [employer]? Visite a su mdico o a su profesional de la salud para chequear su evolucin peridicamente. Controle su presin sangunea como le haya indicado. Pregunte a su mdico o a su profesional de la salud cul debe ser su presin sangunea y cundo deber comunicarse con l o ella. Comunquese con su mdico o con su profesional de la salud si observa que su pulso cardiaco es rpido o irregular. Las mujeres deben informar a su mdico si estn buscando quedar embarazadas o si creen que estn embarazadas. Existe la posibilidad de efectos secundarios graves a un beb sin nacer, particularmente durante el segundo o tercer trimestre. Para ms informacin hable con su profesional de la salud o su farmacutico. Puede experimentar somnolencia o mareos. No conduzca ni utilice maquinaria, ni haga nada que Scientist, research (life sciences) en estado de alerta hasta que sepa cmo le afecta este medicamento. No se siente ni se ponga de pie con rapidez, especialmente si es un paciente de edad avanzada. Esto reduce el riesgo de mareos o [employer]. El alcohol puede aumentar los mareos y la somnolencia. Evite consumir bebidas alcohlicas. Evite los sustitutos de la  sal a menos que su mdico o su profesional de la salud indique lo contrario. No  se trate usted mismo para tos, resfros o dolores mientras est tomando este medicamento sin Science writer a su mdico o a su profesional de Radiographer, therapeutic. Algunos ingredientes pueden aumentar su presin sangunea. Qu efectos secundarios puedo tener al [employer] este medicamento? Efectos secundarios que debe informar a su mdico o a Producer, television/film/video de la salud tan pronto como sea posible: -confusin, mareos, aturdimiento o Corporate investment banker -reduccin en el volumen de orina -dificultad al respirar o tragar, ronquera o estrechamiento de la garganta -pulso cardiaco rpido o irregular, palpitaciones o dolor en el pecho -erupcin cutnea, picazn -hinchazn del rostro, labios, lengua, manos o pies Efectos secundarios que, por lo general, no requieren atencin mdica (debe informarlos a su mdico o a su profesional de la salud si persisten o si son molestos): -tos -disminucin de la funcin sexual o del deseo -dolor de cabeza -congestin nasal o nariz tapada -nuseas o dolor de Teaching laboratory technician -dolores o calambres musculares Puede ser que esta lista no menciona todos los posibles efectos secundarios. Comunquese a su mdico por asesoramiento mdico [employer]. Usted puede informar los efectos secundarios a la FDA por telfono al 1-800-FDA-1088. Dnde debo guardar mi medicina? Mantngala fuera del alcance de los nios. Gurdela a [employer], entre 15 y 30 grados C (24 y 28 grados F). Protjala de la luz. Mantenga el envase bien cerrado. Deseche todo el medicamento que no haya utilizado, despus de la fecha de vencimiento. ATENCIN: Este folleto es un resumen. Puede ser que no cubra toda la posible informacin. Si usted tiene preguntas acerca de esta medicina, consulte con su mdico, su farmacutico o su profesional de Radiographer, therapeutic.  2018 Elsevier/Gold Standard (2014-03-20 00:00:00) Hipertensin Hypertension El trmino hipertensin es otra forma de denominar a la presin arterial elevada. La  presin arterial elevada fuerza al corazn a trabajar ms para bombear la sangre. Esto puede causar problemas con el paso del [place]. Una lectura de presin arterial est compuesta por 2 nmeros. Hay un nmero superior (sistlico) sobre un nmero inferior (diastlico). Lo ideal es tener la presin arterial por debajo de 120/80. Las decisiones saludables pueden ayudarle a disminuir su presin arterial. Es posible que necesite medicamentos que le ayuden a disminuir su presin arterial si:  Su presin arterial no disminuye mediante decisiones saludables.  Su presin arterial est por encima de 130/80. Siga estas instrucciones en su casa: Comida y bebida   Si se lo indican, siga el plan de alimentacin de DASH (Dietary Approaches to Stop Hypertension, Maneras de alimentarse para detener la hipertensin). Esta dieta incluye:  Que la mitad del plato de cada comida sea de frutas y verduras.  Que un cuarto del plato de cada comida sea de cereales integrales. Los cereales integrales incluyen pasta integral, arroz integral y pan integral.  Comer y beber productos lcteos con bajo contenido de [place], como leche descremada o yogur bajo en grasas.  Que un cuarto del plato de cada comida sea de protenas bajas en grasa [place]). Las protenas bajas en grasa incluyen pescado, pollo sin piel, huevos, frijoles y tofu.  Evitar consumir carne grasa, carne curada y procesada, o pollo con piel.  Evitar consumir alimentos prehechos o procesados.  Consuma menos de 1500 mg de sal (sodio) por da.  Limite el consumo de alcohol a no ms de 1 medida por da si es mujer y no est Orthoptist y a 2 medidas por da si es  hombre. Una medida equivale a 12onzas de cerveza, 5onzas de vino o 1onzas de bebidas alcohlicas de alta graduacin. Estilo de vida   Trabaje con su mdico para mantenerse en un peso saludable o para perder peso. Pregntele a su mdico cul es el peso recomendable para usted.  Realice al menos 30  minutos de ejercicio que haga que se acelere su corazn (ejercicio Magazine features editoraerbico) la DIRECTVmayora de los das de la [place]. Estos pueden incluir caminar, nadar o andar en bicicleta.  Realice al menos 30 minutos de ejercicio que fortalezca sus msculos (ejercicios de resistencia) al menos 3 das a la [place]. Estos pueden incluir levantar pesas o hacer pilates.  No consuma ningn producto que contenga nicotina o tabaco. Esto incluye cigarrillos y cigarrillos electrnicos. Si necesita ayuda para dejar de fumar, consulte al American Expressmdico.  Controle su presin arterial en su casa tal como le indic el mdico.  Concurra a todas las visitas de control como se lo haya indicado el mdico. Esto es importante. Medicamentos   Baxter Internationalome los medicamentos de venta libre y los recetados solamente como se lo haya indicado el mdico. Siga cuidadosamente las indicaciones.  No omita las dosis de medicamentos para la presin arterial. Los medicamentos pierden eficacia si omite dosis. El hecho de omitir las dosis tambin [place]a el riesgo de otros problemas.  Pregntele a su mdico a qu efectos secundarios o reacciones a los Museum/gallery curatormedicamentos debe prestar atencin. Comunquese con un mdico si:  Piensa que tiene [place] reaccin a los medicamentos que est tomando.  Tiene dolores de cabeza frecuentes (recurrentes).  Siente mareos.  Tiene hinchazn en los tobillos.  Tiene problemas de visin. Solicite ayuda de inmediato si:  Siente un dolor de cabeza muy intenso.  Comienza a sentirse confundido.  Se siente dbil o adormecido.  Siente que va a desmayarse.  Siente un dolor muy intenso en:  El pecho.  El vientre (abdomen).  Devuelve (vomita) ms de una vez.  Tiene dificultad para respirar. Resumen  El trmino hipertensin es otra forma de denominar a la presin arterial elevada.  Las decisiones saludables pueden ayudarle a disminuir su presin arterial. Si no puede controlar su presin arterial mediante decisiones  saludables, es posible que deba tomar medicamentos. Esta informacin no tiene Theme park managercomo fin reemplazar el consejo del mdico. Asegrese de hacerle al mdico cualquier pregunta que tenga. Document Released: 07/16/2009 Document Revised: 01/08/2016 Document Reviewed: 01/08/2016 Elsevier Interactive Patient Education  2017 [employer].

## 2016-06-11 NOTE — Progress Notes (Signed)
Patient is here for f/up  Patient has not taking her medication for today  Patient denies pain for today

## 2016-06-11 NOTE — Patient Instructions (Signed)
Follow up with Arrie Senate in 1 month   Recuento de carbohidratos para la diabetes mellitus en los adultos Carbohydrate Counting for Diabetes Mellitus, Adult El recuento de carbohidratos es un mtodo destinado a Midwife un registro de la cantidad de carbohidratos que se ingieren. La ingesta natural de carbohidratos aumenta la cantidad de azcar (glucosa) en la sangre. El recuento de la cantidad de carbohidratos que se ingieren sirve para que el nivel de glucosa sangunea (glucemia) permanezca dentro de los lmites normales, lo que ayuda a Pharmacologist la diabetes (diabetes mellitus) bajo control. Es importante saber la cantidad de carbohidratos que se pueden ingerir en cada comida sin correr Surveyor, minerals. Esto es Government social research officer. Un especialista en alimentacin y nutricin (nutricionista certificado) puede ayudarlo a crear un plan de alimentacin y a calcular la cantidad de carbohidratos que debe ingerir en cada comida y colacin. Los siguientes alimentos incluyen carbohidratos:  Granos, como panes y cereales.  Frijoles secos y productos con soja.  Verduras con almidn, como papas, guisantes y maz.  Nils Pyle y jugos de frutas.  Leche y Dentist.  Dulces y colaciones, como pasteles, galletas, caramelos, papas fritas y refrescos. Cmo se calculan los carbohidratos? Hay dos maneras de calcular los carbohidratos de los alimentos. Puede usar cualquiera de [address] o [place] combinacin de [place]. Leer la etiqueta de informacin nutricional de los alimentos envasados  La lista de informacin nutricional est incluida en las etiquetas de casi todas las bebidas y los alimentos envasados de los [place]. Incluye lo siguiente:  El tamao de la porcin.  Informacin sobre los nutrientes de cada porcin, incluidos los gramos (g) de carbohidratos por porcin. Para usar la informacin nutricional:  Decida cuntas porciones va a comer.  Multiplique la cantidad de porciones por el  nmero de carbohidratos por porcin.  El resultado es la cantidad total de carbohidratos que comer. Conocer los tamaos de las porciones estndar de otros alimentos  Cuando coma alimentos que contienen carbohidratos que no estn envasados o no incluyen la informacin nutricional en la etiqueta, debe medir las porciones para poder calcular la cantidad de carbohidratos:  Mida los alimentos que comer con una balanza de alimentos o una taza medidora, si es necesario.  Decida cuntas porciones de Programmer, systems.  Multiplique el nmero de porciones por15. La mayora de los alimentos con alto contenido de carbohidratos contienen unos 15g de carbohidratos por porcin.  Por ejemplo, si come 8onzas (170g) de fresas, habr comido 2porciones y 30g de carbohidratos (2porciones x 15g=30g).  En el caso de las comidas que contienen mezclas de ms de un alimento, como las sopas y los guisos, debe calcular los carbohidratos de cada alimento que se incluye. La siguiente [employer] tamaos de las porciones estndar de los alimentos corrientes con alto contenido de carbohidratos. Cada una de estas porciones tiene aproximadamente 15g de carbohidratos:  pan de hamburguesa o muffin ingls.  onza (15ml) de almbar.  onza (14g) de gelatina.  1rebanada de pan.  1tortilla de seispulgadas.  3onzas (85g) de arroz o pasta cocidos.  4onzas (113g) de frijoles secos cocidos.  4onzas (113g) de verduras con almidn, como guisantes, maz o papas.  4onzas (113g) de cereal caliente.  4onzas (113g) de pur de papas o de una papa grande al horno.  4onzas (113g) de frutas en lata o congeladas.  4onzas ( ) de jugo de frutas.  4a 6galletas.  6croquetas de pollo.  6onzas (170g) de cereales secos sin azcar.  6onzas (170g) de  yogur descremado sin ningn agregado o de yogur endulzado con Insurance claims handleredulcorante artificial.  8onzas (240ml) de  Holle[place]che.  8onzas (170g) de frutas frescas o una fruta pequea.  24onzas (680g) de palomitas de maz. Ejemplo de recuento de carbohidratos Ejemplo de comida   3onzas (85g) de pechuga de pollo.  6onzas (170g) de arroz integral.  4onzas (113g) de maz.  8onzas (240ml) de leche.  8onzas (170g) de fresas con crema batida sin azcar. Clculo de carbohidratos  1. Identifique los alimentos que contienen carbohidratos:  Arroz.  Maz.  Leche.  Jinny SandersFresas. 2. Calcule cuntas porciones come de cada alimento:  2 porciones de arroz.  1 porcin de maz.  1 porcin de leche.  1 porcin de fresas. 3. Multiplique cada nmero de porciones por 15g:  2 porciones de arroz x 15 g = 30 g.  1 porcin de maz x 15 g = 15 g.  1 porcin de leche x 15 g = 15 g.  1 porcin de fresas x 15 g = 15 g. 4. Sume todas las cantidades para conocer el total de gramos de carbohidratos consumidos:  30g + 15g + 15g + 15g = 75g de carbohidratos en total. Esta informacin no tiene como fin reemplazar el consejo del mdico. Asegrese de hacerle al mdico cualquier pregunta que tenga. Document Released: 04/20/2011 Document Revised: 01/14/2016 Document Reviewed: 07/10/2015 Elsevier Interactive Patient Education  2017 [employer].

## 2016-06-11 NOTE — Progress Notes (Signed)
Subjective:  Patient ID: April Fry, female    DOB: [birth year]  Age: 56 y.o. MRN: [id number]  CC: Diabetes and Hypertension   HPI April Fry presents for   Diabetes Mellitus: Patient presents for annual physical examination and reports history of diabetes Symptoms: none.  Patient denies foot ulcerations, nausea, paresthesia of the feet, visual disturbances and vomitting. Denies history of diabetic eye exam within the last year. Evaluation to date has been included: hemoglobin A1C.  Home sugars: BGs range between 160 and 180. Treatment to date: Continued metformin which has been effective.   Hypertension: Patient here for follow-up of elevated blood pressure. She is exercising and is not adherent to low salt diet.  Blood pressure Patient does not check BP at home. well controlled at home. Cardiac symptoms none. Patient denies chest pain, claudication, dyspnea, fatigue, lower extremity edema, palpitations and syncope.  Cardiovascular risk factors: diabetes mellitus, hypertension, obesity (BMI >= 30 kg/m2) and sedentary lifestyle. Use of agents associated with hypertension: NSAIDS. History of target organ damage: none.   Outpatient Medications Prior to Visit  Medication Sig Dispense Refill  . amLODipine (NORVASC) 5 MG tablet Take 1 tablet (5 mg total) by mouth daily. 30 tablet 6  . blood glucose meter kit and supplies KIT Dispense based on patient and insurance preference. Use up to four times daily as directed. (FOR ICD-9 250.00, 250.01). 1 each 0  . ibuprofen (ADVIL,MOTRIN) 200 MG tablet Take 400 mg by mouth every 6 (six) hours as needed.    . metFORMIN (GLUCOPHAGE) 1000 MG tablet Take 1 tablet (1,000 mg total) by mouth 2 (two) times daily with a meal. 60 tablet 6   No facility-administered medications prior to visit.     ROS Review of Systems  Constitutional: Negative.   Eyes: Negative.   Respiratory: Negative.   Cardiovascular: Negative.   Gastrointestinal: Negative.    Endocrine: Negative.   Skin: Negative.   Psychiatric/Behavioral: Negative.     Objective:  BP (!) 149/78 (BP Location: Left Arm, Patient Position: Sitting, Cuff Size: Normal)   Pulse 71   Temp 98.2 F (36.8 C) (Oral)   Resp 18   Ht 5' (1.524 m)   Wt 187 lb 6.4 oz (85 kg)   SpO2 94%   BMI 36.60 kg/m   BP/Weight 06/11/2016 06/03/2016 05/02/5571  Systolic BP 220 254 270  Diastolic BP 78 70 72  Wt. (Lbs) 187.4 189.6 -  BMI 36.6 37.03 -     Physical Exam  Constitutional: She appears well-developed and well-nourished.  Eyes: Conjunctivae are normal. Pupils are equal, round, and reactive to light.  Cardiovascular: Normal rate, regular rhythm, normal heart sounds and intact distal pulses.   Pulmonary/Chest: Effort normal and breath sounds normal.  Abdominal: Soft. Bowel sounds are normal.  Skin: Skin is warm and dry.  Psychiatric: She has a normal mood and affect.  Nursing note and vitals reviewed.   Diabetic Foot Exam - Simple   Simple Foot Form Diabetic Foot exam was performed with the following findings:  Yes 06/11/2016 11:51 AM  Visual Inspection No deformities, no ulcerations, no other skin breakdown bilaterally:  Yes Sensation Testing Intact to touch and monofilament testing bilaterally:  Yes Pulse Check Posterior Tibialis and Dorsalis pulse intact bilaterally:  Yes Comments    Assessment & Plan:   Problem List Items Addressed This Visit      Cardiovascular and Mediastinum   HTN (hypertension)   Follow up with clinical pharmacist in 2 weeks  Follow up  with PCP in 3 months     Relevant Medications   losartan (COZAAR) 50 MG tablet     Endocrine   Diabetes mellitus ([place]) - Primary   Follow up with clinical pharmacist in 2 weeks   Follow up with PCP in 3 months   Relevant Medications   glucose blood test strip   losartan (COZAAR) 50 MG tablet   Other Relevant Orders   Glucose (CBG) (Completed)   Lipid Panel (Completed)   POCT UA - Microalbumin (Completed)     Ambulatory referral to Ophthalmology    Other Visit Diagnoses    Health care maintenance       Relevant Orders   Tdap vaccine greater than or equal to 7yo IM (Completed)   MM SCREENING BREAST TOMO BILATERAL      Meds ordered this encounter  Medications  . Blood Glucose Monitoring Suppl (TRUE METRIX METER) w/Device KIT    Sig: 1 Device by Does not apply route once.    Dispense:  1 kit    Refill:  0    Order Specific Question:   Supervising Provider    Answer:   Tresa Garter [id number]  . TRUEPLUS LANCETS 28G MISC    Sig: 1 kit by Does not apply route once.    Dispense:  100 each    Refill:  12    Order Specific Question:   Supervising Provider    Answer:   Tresa Garter [id number]  . glucose blood test strip    Sig: Use as instructed    Dispense:  100 each    Refill:  12    Order Specific Question:   Supervising Provider    Answer:   Tresa Garter [[id number]]  . losartan (COZAAR) 50 MG tablet    Sig: Take 1 tablet (50 mg total) by mouth daily.    Dispense:  30 tablet    Refill:  2    Order Specific Question:   Supervising Provider    Answer:   Tresa Garter [id number]    Follow-up: Return in about 2 weeks (around 06/25/2016) for BP check with Stacy.   Alfonse Spruce FNP

## 2016-06-11 NOTE — Progress Notes (Signed)
  Echocardiogram 2D Echocardiogram has been performed.  Dorsie Burich L Androw 06/11/2016, 9:48 AM

## 2016-06-12 ENCOUNTER — Telehealth: Payer: Self-pay

## 2016-06-12 LAB — LIPID PANEL
Chol/HDL Ratio: 3.7 ratio (ref 0.0–4.4)
Cholesterol, Total: 203 mg/dL — ABNORMAL HIGH (ref 100–199)
HDL: 55 mg/dL (ref >39)
LDL Calculated: 126 mg/dL — ABNORMAL HIGH (ref 0–99)
Triglycerides: 110 mg/dL (ref 0–149)
VLDL CHOLESTEROL CAL: 22 mg/dL (ref 5–40)

## 2016-06-12 NOTE — Telephone Encounter (Signed)
-----   Message from April LombardNubia K Fry, [place] sent at 06/12/2016  9:34 AM EDT ----- Please call and let the patient know her chest xray was normal and the pneumonia has cleared.

## 2016-06-12 NOTE — Telephone Encounter (Signed)
CMA call patient regarding x ray results  Patient did not answer but CMA left a detailed message & stated that if have any questions just to call back at the office

## 2016-06-12 NOTE — Progress Notes (Signed)
Please call and let the patient know her chest xray was normal and the pneumonia has cleared.

## 2016-06-17 ENCOUNTER — Other Ambulatory Visit: Payer: Self-pay | Admitting: Family Medicine

## 2016-06-17 ENCOUNTER — Telehealth: Payer: Self-pay

## 2016-06-17 DIAGNOSIS — E1169 Type 2 diabetes mellitus with other specified complication: Secondary | ICD-10-CM

## 2016-06-17 DIAGNOSIS — E785 Hyperlipidemia, unspecified: Principal | ICD-10-CM

## 2016-06-17 MED ORDER — SIMVASTATIN 10 MG PO TABS: 10.0000 mg | ORAL_TABLET | Freq: Every day | ORAL | 0 refills | Status: DC

## 2016-06-17 NOTE — Telephone Encounter (Signed)
-----   Message from Lizbeth BarkMandesia R Hairston, FNP sent at 06/17/2016 12:31 PM EDT ----- -Lipid levels were elevated. This can increase your risk of heart disease. You will be prescribed simvastatin to help lower levels. Recommend follow up in 3 months

## 2016-06-17 NOTE — Telephone Encounter (Signed)
CMA call regarding lab results   CMA spoke with He[place] CarolinaKarina Sanchez Verify patient DOB  Daughter was aware and understood

## 2016-07-08 ENCOUNTER — Telehealth: Payer: Self-pay | Admitting: Family Medicine

## 2016-07-08 MED FILL — ?AMLODIPINE BESYLATE 5 MG T: 5 | 30 days supply | Qty: 30 | Fill #0

## 2016-07-08 MED FILL — metFORMIN HCL 1000 MG TABS: 1000 | 30 days supply | Qty: 60 | Fill #0

## 2016-07-08 NOTE — Telephone Encounter (Signed)
Error

## 2016-07-22 ENCOUNTER — Other Ambulatory Visit: Payer: Self-pay | Admitting: Family Medicine

## 2016-07-22 ENCOUNTER — Ambulatory Visit: Payer: Self-pay | Attending: Family Medicine | Admitting: Family Medicine

## 2016-07-22 VITALS — BP 123/73 | HR 73 | Temp 98.1°F | Resp 18 | Ht 60.0 in | Wt 187.0 lb

## 2016-07-22 DIAGNOSIS — E1169 Type 2 diabetes mellitus with other specified complication: Secondary | ICD-10-CM

## 2016-07-22 DIAGNOSIS — Z6830 Body mass index (BMI) 30.0-30.9, adult: Secondary | ICD-10-CM | POA: Insufficient documentation

## 2016-07-22 DIAGNOSIS — E1165 Type 2 diabetes mellitus with hyperglycemia: Secondary | ICD-10-CM

## 2016-07-22 DIAGNOSIS — B351 Tinea unguium: Secondary | ICD-10-CM

## 2016-07-22 DIAGNOSIS — Z0000 Encounter for general adult medical examination without abnormal findings: Secondary | ICD-10-CM

## 2016-07-22 DIAGNOSIS — Z1239 Encounter for other screening for malignant neoplasm of breast: Secondary | ICD-10-CM

## 2016-07-22 DIAGNOSIS — Z1231 Encounter for screening mammogram for malignant neoplasm of breast: Secondary | ICD-10-CM

## 2016-07-22 DIAGNOSIS — E785 Hyperlipidemia, unspecified: Secondary | ICD-10-CM | POA: Insufficient documentation

## 2016-07-22 DIAGNOSIS — E669 Obesity, unspecified: Secondary | ICD-10-CM | POA: Insufficient documentation

## 2016-07-22 DIAGNOSIS — I10 Essential (primary) hypertension: Secondary | ICD-10-CM | POA: Insufficient documentation

## 2016-07-22 DIAGNOSIS — E119 Type 2 diabetes mellitus without complications: Secondary | ICD-10-CM | POA: Insufficient documentation

## 2016-07-22 DIAGNOSIS — Z7984 Long term (current) use of oral hypoglycemic drugs: Secondary | ICD-10-CM | POA: Insufficient documentation

## 2016-07-22 LAB — GLUCOSE, POCT (MANUAL RESULT ENTRY): POC Glucose: 119 mg/dl — AB (ref 70–99)

## 2016-07-22 MED ORDER — CICLOPIROX 8 % EX SOLN: Freq: Every day | CUTANEOUS | 2 refills | Status: DC

## 2016-07-22 MED ORDER — METFORMIN HCL 1000 MG PO TABS: 1000.0000 mg | ORAL_TABLET | Freq: Two times a day (BID) | ORAL | 6 refills | Status: DC

## 2016-07-22 MED ORDER — PNEUMOCOCCAL 13-VAL CONJ VACC IM SUSP: 0.5000 mL | INTRAMUSCULAR | Status: DC

## 2016-07-22 MED ORDER — GLIPIZIDE 5 MG PO TABS: 5.0000 mg | ORAL_TABLET | Freq: Every day | ORAL | 2 refills | Status: DC

## 2016-07-22 MED ORDER — PNEUMOCOCCAL 13-VAL CONJ VACC IM SUSP: 0.5000 mL | Freq: Once | INTRAMUSCULAR | Status: DC

## 2016-07-22 MED FILL — glipiZIDE 5 MG TABS: 5 | 30 days supply | Qty: 30 | Fill #0

## 2016-07-22 NOTE — Progress Notes (Signed)
Patient is here for f/up DM & HTN

## 2016-07-22 NOTE — Patient Instructions (Signed)
La diabetes mellitus y los alimentos (Diabetes Mellitus and Food) Es importante que controle su nivel de azcar en la sangre (glucosa). El nivel de glucosa en sangre depende en gran medida de lo que usted come. Comer alimentos saludables en las cantidades adecuadas a lo largo del da, aproximadamente a la misma hora todos los das, lo ayudar a controlar su nivel de glucosa en sangre. Tambin puede ayudarlo a retrasar o evitar el empeoramiento de la diabetes mellitus. Comer de manera saludable incluso puede ayudarlo a mejorar el nivel de presin arterial y a alcanzar o mantener un peso saludable. Entre las recomendaciones generales para alimentarse y cocinar los alimentos de forma saludable, se incluyen las siguientes:  Respetar las comidas principales y comer colaciones con regularidad. Evitar pasar largos perodos sin comer con el fin de perder peso.  Seguir una dieta que consista principalmente en alimentos de origen vegetal, como frutas, vegetales, frutos secos, legumbres y cereales integrales.  Utilizar mtodos de coccin a baja temperatura, como hornear, en lugar de mtodos de coccin a alta temperatura, como frer en abundante aceite. Trabaje con el nutricionista para aprender a usar la informacin nutricional de las etiquetas de los alimentos. CMO PUEDEN AFECTARME LOS ALIMENTOS? Carbohidratos Los carbohidratos afectan el nivel de glucosa en sangre ms que cualquier otro tipo de alimento. El nutricionista lo ayudar a determinar cuntos carbohidratos puede consumir en cada comida y ensearle a contarlos. El recuento de carbohidratos es importante para mantener la glucosa en sangre en un nivel saludable, en especial si utiliza insulina o toma determinados medicamentos para la diabetes mellitus. Alcohol El alcohol puede provocar disminuciones sbitas de la glucosa en sangre (hipoglucemia), en especial si utiliza insulina o toma determinados medicamentos para la diabetes mellitus. La  hipoglucemia es una afeccin que puede poner en peligro la vida. Los sntomas de la hipoglucemia (somnolencia, mareos y desorientacin) son similares a los sntomas de haber consumido mucho alcohol. Si el mdico lo autoriza a beber alcohol, hgalo con moderacin y siga estas pautas:  Las mujeres no deben beber ms de un trago por da, y los hombres no deben beber ms de dos tragos por da. Un trago es igual a:  12 onzas (355 ml) de cerveza  5 onzas de vino (150 ml) de vino  1,5onzas (45ml) de bebidas espirituosas  No beba con el estmago vaco.  Mantngase hidratado. Beba agua, gaseosas dietticas o t helado sin azcar.  Las gaseosas comunes, los jugos y otros refrescos podran contener muchos carbohidratos y se deben contar. QU ALIMENTOS NO SE RECOMIENDAN? Cuando haga las elecciones de alimentos, es importante que recuerde que todos los alimentos son distintos. Algunos tienen menos nutrientes que otros por porcin, aunque podran tener la misma cantidad de caloras o carbohidratos. Es difcil darle al cuerpo lo que necesita cuando consume alimentos con menos nutrientes. Estos son algunos ejemplos de alimentos que debera evitar ya que contienen muchas caloras y carbohidratos, pero pocos nutrientes:  Grasas trans (la mayora de los alimentos procesados incluyen grasas trans en la etiqueta de Informacin nutricional).  Gaseosas comunes.  Jugos.  Caramelos.  Dulces, como tortas, pasteles, rosquillas y galletas.  Comidas fritas. QU ALIMENTOS PUEDO COMER? Consuma alimentos ricos en nutrientes, que nutrirn el cuerpo y lo mantendrn saludable. Los alimentos que debe comer tambin dependern de varios factores, como:  Las caloras que necesita.  Los medicamentos que toma.  Su peso.  El nivel de glucosa en sangre.  El nivel de presin arterial.  El nivel de colesterol. Debe consumir   una amplia variedad de alimentos, por ejemplo:  Protenas.  Cortes de carne  magros.  Protenas con bajo contenido de grasas saturadas, como pescado, clara de huevo y frijoles. Evite las carnes procesadas.  Frutas y vegetales.  Frutas y vegetales que pueden ayudar a controlar los niveles sanguneos de glucosa, como manzanas, mangos y batatas.  Productos lcteos.  Elija productos lcteos sin grasa o con bajo contenido de grasa, como leche, yogur y queso.  Cereales, panes, pastas y arroz.  Elija cereales integrales, como panes multicereales, avena en grano y arroz integral. Estos alimentos pueden ayudar a controlar la presin arterial.  Grasas.  Alimentos que contengan grasas saludables, como frutos secos, aguacate, aceite de oliva, aceite de canola y pescado. TODOS LOS QUE PADECEN DIABETES MELLITUS TIENEN EL MISMO PLAN DE COMIDAS? Dado que todas las personas que padecen diabetes mellitus son distintas, no hay un solo plan de comidas que funcione para todos. Es muy importante que se rena con un nutricionista que lo ayudar a crear un plan de comidas adecuado para usted. Esta informacin no tiene como fin reemplazar el consejo del mdico. Asegrese de hacerle al mdico cualquier pregunta que tenga. Document Released: 05/05/2007 Document Revised: 02/16/2014 Document Reviewed: 12/23/2012 Elsevier Interactive Patient Education  2017 [employer]vier Inc.  

## 2016-07-22 NOTE — Progress Notes (Signed)
Subjective:  Patient ID: April Fry, female    DOB: Feb 13, 1960  Age: 56 y.o. MRN: [id number]  CC: Diabetes   HPI April Fry presents for  history of diabetes. Symptoms: none.  Patient denies foot ulcerations, nausea, paresthesia of the feet, visual disturbances and vomitting. Denies history of diabetic eye exam within the last year. Evaluation to date has been included: hemoglobin A1C.  Home sugars: BGs range between 140 and 247 Treatment to date: Continued metformin which has been somewhat effective. History of HTN/HLD BP is well controlled in office today. Patient does not check BP at home.. Cardiac symptoms none. Patient denies chest pain, claudication, dyspnea, fatigue, lower extremity edema, palpitations and syncope.  Cardiovascular risk factors: diabetes mellitus, hypertension, obesity (BMI >= 30 kg/m2) and sedentary lifestyle. Use of agents associated with hypertension: NSAIDS. History of target organ damage: none.  Outpatient Medications Prior to Visit  Medication Sig Dispense Refill  . amLODipine (NORVASC) 5 MG tablet Take 1 tablet (5 mg total) by mouth daily. 30 tablet 6  . blood glucose meter kit and supplies KIT Dispense based on patient and insurance preference. Use up to four times daily as directed. (FOR ICD-9 250.00, 250.01). 1 each 0  . glucose blood test strip Use as instructed 100 each 12  . ibuprofen (ADVIL,MOTRIN) 200 MG tablet Take 400 mg by mouth every 6 (six) hours as needed.    Marland Kitchen losartan (COZAAR) 50 MG tablet Take 1 tablet (50 mg total) by mouth daily. 30 tablet 2  . simvastatin (ZOCOR) 10 MG tablet Take 1 tablet (10 mg total) by mouth at bedtime. 90 tablet 0  . metFORMIN (GLUCOPHAGE) 1000 MG tablet Take 1 tablet (1,000 mg total) by mouth 2 (two) times daily with a meal. 60 tablet 6   No facility-administered medications prior to visit.     ROS Review of Systems  Constitutional: Negative.   Eyes: Negative.   Respiratory: Negative.     Cardiovascular: Negative.   Gastrointestinal: Negative.   Endocrine: Negative.   Musculoskeletal: Negative.   Skin: Negative.    Objective:  BP 123/73 (BP Location: Left Arm, Patient Position: Sitting, Cuff Size: Normal)   Pulse 73   Temp 98.1 F (36.7 C) (Oral)   Resp 18   Ht 5' (1.524 m)   Wt 187 lb (84.8 kg)   SpO2 96%   BMI 36.52 kg/m   BP/Weight 07/22/2016 06/11/2016 2/33/0076  Systolic BP 226 333 545  Diastolic BP 73 78 70  Wt. (Lbs) 187 187.4 189.6  BMI 36.52 36.6 37.03     Physical Exam  Constitutional: She appears well-developed and well-nourished.  Eyes: Conjunctivae are normal. Pupils are equal, round, and reactive to light.  Neck: Normal range of motion. Neck supple. No JVD present.  Cardiovascular: Normal rate, regular rhythm, normal heart sounds and intact distal pulses.   Pulmonary/Chest: Effort normal and breath sounds normal.  Abdominal: Soft. Bowel sounds are normal.  Skin: Skin is warm and dry.  onychomycosis of bilateral toenail present.  Nursing note and vitals reviewed.  Assessment & Plan:   Problem List Items Addressed This Visit      Cardiovascular and Mediastinum   HTN (hypertension)     Endocrine   Diabetes mellitus ([place]) - Primary   Start checking CBG BID   Added glipizide for better blood glucose control.   Follow up with clinical pharmacist in 2 weeks.   Follow up with PCP in 6 weeks   Relevant Medications   glipiZIDE (GLUCOTROL)  5 MG tablet   metFORMIN (GLUCOPHAGE) 1000 MG tablet   Hyperlipidemia associated with type 2 diabetes mellitus (HCC)   Relevant Medications   glipiZIDE (GLUCOTROL) 5 MG tablet   metFORMIN (GLUCOPHAGE) 1000 MG tablet   Other Relevant Orders   Glucose (CBG) (Completed)    Other Visit Diagnoses    Healthcare maintenance       Relevant Medications   pneumococcal 13-valent conjugate vaccine (PREVNAR 13) injection 0.5 mL (Start on 07/23/2016 10:00 AM)   Other Relevant Orders   MM SCREENING BREAST TOMO  BILATERAL   Breast cancer screening       Relevant Orders   MM SCREENING BREAST TOMO BILATERAL      Meds ordered this encounter  Medications  . glipiZIDE (GLUCOTROL) 5 MG tablet    Sig: Take 1 tablet (5 mg total) by mouth daily before breakfast.    Dispense:  30 tablet    Refill:  2    Order Specific Question:   Supervising Provider    Answer:   Tresa Garter [id number]  . metFORMIN (GLUCOPHAGE) 1000 MG tablet    Sig: Take 1 tablet (1,000 mg total) by mouth 2 (two) times daily with a meal.    Dispense:  60 tablet    Refill:  6    Order Specific Question:   Supervising Provider    Answer:   Tresa Garter [id number]  . pneumococcal 13-valent conjugate vaccine (PREVNAR 13) injection 0.5 mL    Follow-up: Return in about 2 weeks (around 08/05/2016) for DM check with Stacy.   Alfonse Spruce FNP

## 2016-08-05 ENCOUNTER — Ambulatory Visit: Payer: Self-pay | Attending: Family Medicine | Admitting: Pharmacist

## 2016-08-05 DIAGNOSIS — E1165 Type 2 diabetes mellitus with hyperglycemia: Secondary | ICD-10-CM

## 2016-08-05 NOTE — Patient Instructions (Addendum)
Thanks for coming to see us  [facility]

## 2016-08-05 NOTE — Progress Notes (Signed)
    S:     No chief complaint on file.   Patient arrives in good spirits with her daughter and grandchildren.  Presents for diabetes education at the request of Dr. Delford FieldWright. Patient was referred on 07/22/16.  Patient was last seen by Primary Care Provider on 07/22/16. Interpreter [phone] was used for the entirety of the visit.  Patient reports adherence with medications.  Current diabetes medications include: metformin 1000 mg BID, glipizide 5 mg daily. Patient reports that she is taking insulin though we have never ordered it for her. Patient reports that she has been on it for years [facility] It was originally prescribed by another doctor. She reports taking 10 units once a day.    Patient denies hypoglycemic events.  Patient reported dietary habits: Eats 3 meals/day. Mostly avoids carbs during meals but does have some bread and also drinks a regular coke. She would like to be able to eat cake when she goes to parties.  Patient reported exercise habits: none   Patient denies nocturia.  Patient denies neuropathy. Patient denies visual changes. Patient reports self foot exams.   O:  Physical Exam   ROS   Lab Results  Component Value Date   HGBA1C 9.2 (H) 05/21/2016   There were no vitals filed for this visit.  Home fasting CBG: 100s-140s Home Random CBGs: 70s-180s  A/P: Diabetes longstanding currently UNcontrolled based on A1c of 9.2 but improving based on home readings . Patient denies hypoglycemic events and is able to verbalize appropriate hypoglycemia management plan. Patient reports adherence with medication. Control is suboptimal due to poor understanding of diabetes.  Continue metformin 1000 mg BID, glipizide 5 mg, and the insulin. Called [facility] and patient is on NPH 10 units daily. Patient has been buying it over the counter monthly and has been taking it over a year though we had no record of any insulin. Patient instructed to  bring the insulin to clinic next visit to verify what type she is taking. Can adjust medications at that time.  Next A1C anticipated July 2018.    Written patient instructions provided.  Total time in face to face counseling 30  minutes.   Follow up in Pharmacist Clinic Visit PRN, [facility].   Patient seen with Dutch QuintKaley Whitesell, PharmD Candidate and Louis MatteJay Worthington, PharmD Candidate

## 2016-08-28 ENCOUNTER — Ambulatory Visit: Payer: Self-pay | Admitting: Family Medicine

## 2016-09-10 MED FILL — ?METFORMIN HCL 1,000 MG TAB: 1000 | 30 days supply | Qty: 60 | Fill #0

## 2016-09-10 MED FILL — glipiZIDE 5 MG TABS: 5 | 30 days supply | Qty: 30 | Fill #1

## 2016-09-10 MED FILL — AMLODIPINE BESYLATE 5 MG TA: 5 | 30 days supply | Qty: 30 | Fill #1

## 2016-09-24 ENCOUNTER — Ambulatory Visit: Payer: Self-pay | Admitting: Family Medicine

## 2016-10-09 ENCOUNTER — Ambulatory Visit: Payer: Self-pay | Attending: Family Medicine | Admitting: Family Medicine

## 2016-10-09 ENCOUNTER — Encounter: Payer: Self-pay | Admitting: Family Medicine

## 2016-10-09 VITALS — BP 144/77 | HR 75 | Temp 98.1°F | Resp 18 | Ht 59.0 in | Wt 188.0 lb

## 2016-10-09 DIAGNOSIS — E119 Type 2 diabetes mellitus without complications: Secondary | ICD-10-CM | POA: Insufficient documentation

## 2016-10-09 DIAGNOSIS — E669 Obesity, unspecified: Secondary | ICD-10-CM | POA: Insufficient documentation

## 2016-10-09 DIAGNOSIS — I10 Essential (primary) hypertension: Secondary | ICD-10-CM | POA: Insufficient documentation

## 2016-10-09 DIAGNOSIS — Z6837 Body mass index (BMI) 37.0-37.9, adult: Secondary | ICD-10-CM | POA: Insufficient documentation

## 2016-10-09 DIAGNOSIS — E785 Hyperlipidemia, unspecified: Secondary | ICD-10-CM | POA: Insufficient documentation

## 2016-10-09 DIAGNOSIS — E1165 Type 2 diabetes mellitus with hyperglycemia: Secondary | ICD-10-CM

## 2016-10-09 DIAGNOSIS — Z794 Long term (current) use of insulin: Secondary | ICD-10-CM | POA: Insufficient documentation

## 2016-10-09 DIAGNOSIS — Z79899 Other long term (current) drug therapy: Secondary | ICD-10-CM | POA: Insufficient documentation

## 2016-10-09 DIAGNOSIS — E1169 Type 2 diabetes mellitus with other specified complication: Secondary | ICD-10-CM

## 2016-10-09 LAB — GLUCOSE, POCT (MANUAL RESULT ENTRY): POC GLUCOSE: 121 mg/dL — AB (ref 70–99)

## 2016-10-09 LAB — POCT GLYCOSYLATED HEMOGLOBIN (HGB A1C): HEMOGLOBIN A1C: 6.8

## 2016-10-09 MED ORDER — SIMVASTATIN 10 MG PO TABS: 10.0000 mg | ORAL_TABLET | Freq: Every day | ORAL | 3 refills | Status: DC

## 2016-10-09 MED ORDER — INSULIN NPH (HUMAN) (ISOPHANE) 100 UNIT/ML ~~LOC~~ SUSP: 10.0000 [IU] | Freq: Every day | SUBCUTANEOUS | 11 refills | Status: DC

## 2016-10-09 MED ORDER — METFORMIN HCL 1000 MG PO TABS: 1000.0000 mg | ORAL_TABLET | Freq: Two times a day (BID) | ORAL | 6 refills | Status: DC

## 2016-10-09 MED ORDER — LOSARTAN POTASSIUM 50 MG PO TABS: 50.0000 mg | ORAL_TABLET | Freq: Every day | ORAL | 3 refills | Status: DC

## 2016-10-09 MED ORDER — GLIPIZIDE 5 MG PO TABS: 5.0000 mg | ORAL_TABLET | Freq: Every day | ORAL | 3 refills | Status: DC

## 2016-10-09 MED ORDER — AMLODIPINE BESYLATE 5 MG PO TABS: 5.0000 mg | ORAL_TABLET | Freq: Every day | ORAL | 3 refills | Status: DC

## 2016-10-09 MED FILL — NovoLIN N 100 UNIT/ML SUSP: 100 | 42 days supply | Qty: 10 | Fill #0

## 2016-10-09 NOTE — Patient Instructions (Signed)
La diabetes mellitus y los alimentos (Diabetes Mellitus and Food) Es importante que controle su nivel de azcar en la sangre (glucosa). El nivel de glucosa en sangre depende en gran medida de lo que usted come. Comer alimentos saludables en las cantidades adecuadas a lo largo del da, aproximadamente a la misma hora todos los das, lo ayudar a controlar su nivel de glucosa en sangre. Tambin puede ayudarlo a retrasar o evitar el empeoramiento de la diabetes mellitus. Comer de manera saludable incluso puede ayudarlo a mejorar el nivel de presin arterial y a alcanzar o mantener un peso saludable. Entre las recomendaciones generales para alimentarse y cocinar los alimentos de forma saludable, se incluyen las siguientes:  Respetar las comidas principales y comer colaciones con regularidad. Evitar pasar largos perodos sin comer con el fin de perder peso.  Seguir una dieta que consista principalmente en alimentos de origen vegetal, como frutas, vegetales, frutos secos, legumbres y cereales integrales.  Utilizar mtodos de coccin a baja temperatura, como hornear, en lugar de mtodos de coccin a alta temperatura, como frer en abundante aceite. Trabaje con el nutricionista para aprender a usar la informacin nutricional de las etiquetas de los alimentos. CMO PUEDEN AFECTARME LOS ALIMENTOS? Carbohidratos Los carbohidratos afectan el nivel de glucosa en sangre ms que cualquier otro tipo de alimento. El nutricionista lo ayudar a determinar cuntos carbohidratos puede consumir en cada comida y ensearle a contarlos. El recuento de carbohidratos es importante para mantener la glucosa en sangre en un nivel saludable, en especial si utiliza insulina o toma determinados medicamentos para la diabetes mellitus. Alcohol El alcohol puede provocar disminuciones sbitas de la glucosa en sangre (hipoglucemia), en especial si utiliza insulina o toma determinados medicamentos para la diabetes mellitus. La  hipoglucemia es una afeccin que puede poner en peligro la vida. Los sntomas de la hipoglucemia (somnolencia, mareos y desorientacin) son similares a los sntomas de haber consumido mucho alcohol. Si el mdico lo autoriza a beber alcohol, hgalo con moderacin y siga estas pautas:  Las mujeres no deben beber ms de un trago por da, y los hombres no deben beber ms de dos tragos por da. Un trago es igual a:  12 onzas (355 ml) de cerveza  5 onzas de vino (150 ml) de vino  1,5onzas (45ml) de bebidas espirituosas  No beba con el estmago vaco.  Mantngase hidratado. Beba agua, gaseosas dietticas o t helado sin azcar.  Las gaseosas comunes, los jugos y otros refrescos podran contener muchos carbohidratos y se deben contar. QU ALIMENTOS NO SE RECOMIENDAN? Cuando haga las elecciones de alimentos, es importante que recuerde que todos los alimentos son distintos. Algunos tienen menos nutrientes que otros por porcin, aunque podran tener la misma cantidad de caloras o carbohidratos. Es difcil darle al cuerpo lo que necesita cuando consume alimentos con menos nutrientes. Estos son algunos ejemplos de alimentos que debera evitar ya que contienen muchas caloras y carbohidratos, pero pocos nutrientes:  Grasas trans (la mayora de los alimentos procesados incluyen grasas trans en la etiqueta de Informacin nutricional).  Gaseosas comunes.  Jugos.  Caramelos.  Dulces, como tortas, pasteles, rosquillas y galletas.  Comidas fritas. QU ALIMENTOS PUEDO COMER? Consuma alimentos ricos en nutrientes, que nutrirn el cuerpo y lo mantendrn saludable. Los alimentos que debe comer tambin dependern de varios factores, como:  Las caloras que necesita.  Los medicamentos que toma.  Su peso.  El nivel de glucosa en sangre.  El nivel de presin arterial.  El nivel de colesterol. Debe consumir   una amplia variedad de alimentos, por ejemplo:  Protenas.  Cortes de carne  magros.  Protenas con bajo contenido de grasas saturadas, como pescado, clara de huevo y frijoles. Evite las carnes procesadas.  Frutas y vegetales.  Frutas y vegetales que pueden ayudar a controlar los niveles sanguneos de glucosa, como manzanas, mangos y batatas.  Productos lcteos.  Elija productos lcteos sin grasa o con bajo contenido de grasa, como leche, yogur y queso.  Cereales, panes, pastas y arroz.  Elija cereales integrales, como panes multicereales, avena en grano y arroz integral. Estos alimentos pueden ayudar a controlar la presin arterial.  Grasas.  Alimentos que contengan grasas saludables, como frutos secos, aguacate, aceite de oliva, aceite de canola y pescado. TODOS LOS QUE PADECEN DIABETES MELLITUS TIENEN EL MISMO PLAN DE COMIDAS? Dado que todas las personas que padecen diabetes mellitus son distintas, no hay un solo plan de comidas que funcione para todos. Es muy importante que se rena con un nutricionista que lo ayudar a crear un plan de comidas adecuado para usted. Esta informacin no tiene como fin reemplazar el consejo del mdico. Asegrese de hacerle al mdico cualquier pregunta que tenga. Document Released: 05/05/2007 Document Revised: 02/16/2014 Document Reviewed: 12/23/2012 Elsevier Interactive Patient Education  2017 [employer]vier Inc.  

## 2016-10-09 NOTE — Progress Notes (Signed)
Subjective:  Patient ID: April Fry, female    DOB: 1960/08/27  Age: 56 y.o. MRN: [id number]  CC: Diabetes   HPI [name] presents for history of diabetes. Symptoms: none.  Patient denies foot ulcerations, nausea, paresthesia of the feet, visual disturbances and vomiting. Denies history of diabetic eye exam within the last year. Evaluation to date has been included: hemoglobin A1C.  Home sugars: BGs range between 140 and 247 Treatment to date: Continued metformin which has been somewhat effective. History of HTN/HLD. She reports not taking BP medications prior to office visit. Patient does not check BP at home..Cardiac symptoms none. Patient denies chest pain, claudication, dyspnea, fatigue, lower extremity edema, palpitations and syncope.  Cardiovascular risk factors: diabetes mellitus, hypertension, obesity (BMI >= 30 kg/m2) and sedentary lifestyle. Use of agents associated with hypertension: NSAID'S. History of target organ damage: none.     Outpatient Medications Prior to Visit  Medication Sig Dispense Refill  . blood glucose meter kit and supplies KIT Dispense based on patient and insurance preference. Use up to four times daily as directed. (FOR ICD-9 250.00, 250.01). 1 each 0  . ciclopirox (PENLAC) 8 % solution Apply topically at bedtime. Apply over nail and surrounding skin. Apply daily over previous coat. After seven (7) days, may remove with alcohol and continue cycle. 6.6 mL 2  . glucose blood test strip Use as instructed 100 each 12  . ibuprofen (ADVIL,MOTRIN) 200 MG tablet Take 400 mg by mouth every 6 (six) hours as needed.    Marland Kitchen amLODipine (NORVASC) 5 MG tablet Take 1 tablet (5 mg total) by mouth daily. 30 tablet 6  . glipiZIDE (GLUCOTROL) 5 MG tablet Take 1 tablet (5 mg total) by mouth daily before breakfast. 30 tablet 2  . insulin NPH Human (HUMULIN N,NOVOLIN N) 100 UNIT/ML injection Inject 10 Units into the skin daily before breakfast.    . losartan (COZAAR)  50 MG tablet Take 1 tablet (50 mg total) by mouth daily. 30 tablet 2  . metFORMIN (GLUCOPHAGE) 1000 MG tablet Take 1 tablet (1,000 mg total) by mouth 2 (two) times daily with a meal. 60 tablet 6  . simvastatin (ZOCOR) 10 MG tablet Take 1 tablet (10 mg total) by mouth at bedtime. 90 tablet 0   Facility-Administered Medications Prior to Visit  Medication Dose Route Frequency Provider Last Rate Last Dose  . pneumococcal 13-valent conjugate vaccine (PREVNAR 13) injection 0.5 mL  0.5 mL Intramuscular Once Hairston, Mandesia R, FNP        ROS Review of Systems  Constitutional: Negative.   HENT: Negative.   Eyes: Negative.   Respiratory: Negative.   Cardiovascular: Negative.   Gastrointestinal: Negative.   Genitourinary: Negative.   Musculoskeletal: Negative.   Skin: Negative.   Neurological: Negative.   Psychiatric/Behavioral: Negative.        Objective:  BP (!) 144/77 (BP Location: Left Arm, Patient Position: Sitting, Cuff Size: Normal)   Pulse 75   Temp 98.1 F (36.7 C) (Oral)   Resp 18   Ht _0  (1.499 m)   Wt 188 lb (85.3 kg)   SpO2 94%   BMI 37.97 kg/m   BP/Weight 10/09/2016 7/68/1157 03/17/2033  Systolic BP 597 416 384  Diastolic BP 77 73 78  Wt. (Lbs) 188 187 187.4  BMI 37.97 36.52 36.6     Physical Exam  Constitutional: She appears well-developed and well-nourished.  Eyes: Pupils are equal, round, and reactive to light. Conjunctivae are normal.  Neck: No JVD present.  Cardiovascular: Normal rate, regular rhythm, normal heart sounds and intact distal pulses.   Pulmonary/Chest: Effort normal and breath sounds normal.  Abdominal: Soft. Bowel sounds are normal. There is no tenderness.  Neurological: No sensory deficit.  Skin: Skin is warm and dry.  Nursing note and vitals reviewed.   Assessment & Plan:   Problem List Items Addressed This Visit      Cardiovascular and Mediastinum   HTN (hypertension)   Relevant Medications   amLODipine (NORVASC) 5 MG  tablet   simvastatin (ZOCOR) 10 MG tablet   losartan (COZAAR) 50 MG tablet     Endocrine   Diabetes mellitus ([facility]CC) - Primary   Relevant Medications   insulin NPH Human (HUMULIN N,NOVOLIN N) 100 UNIT/ML injection   simvastatin (ZOCOR) 10 MG tablet   losartan (COZAAR) 50 MG tablet   glipiZIDE (GLUCOTROL) 5 MG tablet   metFORMIN (GLUCOPHAGE) 1000 MG tablet   Hyperlipidemia associated with type 2 diabetes mellitus (HCC)   Relevant Medications   insulin NPH Human (HUMULIN N,NOVOLIN N) 100 UNIT/ML injection   amLODipine (NORVASC) 5 MG tablet   simvastatin (ZOCOR) 10 MG tablet   losartan (COZAAR) 50 MG tablet   glipiZIDE (GLUCOTROL) 5 MG tablet   metFORMIN (GLUCOPHAGE) 1000 MG tablet   Other Relevant Orders   Glucose (CBG) (Completed)   HgB A1c (Completed)      Meds ordered this encounter  Medications  . insulin NPH Human (HUMULIN N,NOVOLIN N) 100 UNIT/ML injection    Sig: Inject 0.1 mLs (10 Units total) into the skin daily before breakfast.    Dispense:  10 mL    Refill:  11    Order Specific Question:   Supervising Provider    Answer:   Tresa Garter [[id number]]  . amLODipine (NORVASC) 5 MG tablet    Sig: Take 1 tablet (5 mg total) by mouth daily.    Dispense:  30 tablet    Refill:  3    Order Specific Question:   Supervising Provider    Answer:   Tresa Garter [id number]  . simvastatin (ZOCOR) 10 MG tablet    Sig: Take 1 tablet (10 mg total) by mouth at bedtime.    Dispense:  30 tablet    Refill:  3    Order Specific Question:   Supervising Provider    Answer:   Tresa Garter [id number]  . losartan (COZAAR) 50 MG tablet    Sig: Take 1 tablet (50 mg total) by mouth daily.    Dispense:  30 tablet    Refill:  3    Order Specific Question:   Supervising Provider    Answer:   Tresa Garter [id number]  . glipiZIDE (GLUCOTROL) 5 MG tablet    Sig: Take 1 tablet (5 mg total) by mouth daily before breakfast.    Dispense:  30 tablet    Refill:  3     Order Specific Question:   Supervising Provider    Answer:   Tresa Garter [id number]  . metFORMIN (GLUCOPHAGE) 1000 MG tablet    Sig: Take 1 tablet (1,000 mg total) by mouth 2 (two) times daily with a meal.    Dispense:  60 tablet    Refill:  6    Order Specific Question:   Supervising Provider    Answer:   Tresa Garter [[id number]]    Follow-up: Return in about 3 months (around 01/08/2017) for DM/HTN.   Alfonse Spruce FNP

## 2016-10-09 NOTE — Progress Notes (Signed)
Patient is here for f/up  

## 2017-01-13 ENCOUNTER — Ambulatory Visit: Payer: Self-pay | Admitting: Family Medicine

## 2017-05-27 MED FILL — NovoLIN N 100 UNIT/ML SUSP: 100 | 42 days supply | Qty: 10 | Fill #1

## 2017-11-09 ENCOUNTER — Other Ambulatory Visit: Payer: Self-pay

## 2017-11-09 MED ORDER — INSULIN NPH (HUMAN) (ISOPHANE) 100 UNIT/ML ~~LOC~~ SUSP: 10.0000 [IU] | Freq: Every day | SUBCUTANEOUS | 0 refills | Status: DC

## 2017-11-25 ENCOUNTER — Other Ambulatory Visit: Payer: Self-pay

## 2017-11-25 ENCOUNTER — Ambulatory Visit: Payer: Self-pay | Attending: Family Medicine | Admitting: Family Medicine

## 2017-11-25 VITALS — BP 188/84 | HR 76 | Temp 98.3°F | Resp 18 | Ht 60.0 in | Wt 192.4 lb

## 2017-11-25 DIAGNOSIS — R350 Frequency of micturition: Secondary | ICD-10-CM

## 2017-11-25 DIAGNOSIS — R0989 Other specified symptoms and signs involving the circulatory and respiratory systems: Secondary | ICD-10-CM

## 2017-11-25 DIAGNOSIS — Z23 Encounter for immunization: Secondary | ICD-10-CM

## 2017-11-25 DIAGNOSIS — R42 Dizziness and giddiness: Secondary | ICD-10-CM

## 2017-11-25 DIAGNOSIS — E7800 Pure hypercholesterolemia, unspecified: Secondary | ICD-10-CM

## 2017-11-25 DIAGNOSIS — E1165 Type 2 diabetes mellitus with hyperglycemia: Secondary | ICD-10-CM

## 2017-11-25 DIAGNOSIS — M2550 Pain in unspecified joint: Secondary | ICD-10-CM

## 2017-11-25 DIAGNOSIS — I10 Essential (primary) hypertension: Secondary | ICD-10-CM

## 2017-11-25 LAB — POCT GLYCOSYLATED HEMOGLOBIN (HGB A1C)
HbA1c POC (<> result, manual entry): 10.3 %
HbA1c, POC (controlled diabetic range): 10.3 % — AB (ref 0.0–7.0)
HbA1c, POC (prediabetic range): 10.3 % — AB (ref 5.7–6.4)
Hemoglobin A1C: 10.3 % — AB (ref 4.0–5.6)

## 2017-11-25 LAB — POCT URINALYSIS DIP (CLINITEK)
Bilirubin, UA: NEGATIVE
Blood, UA: NEGATIVE
Glucose, UA: 250 mg/dL — AB
Ketones, POC UA: NEGATIVE mg/dL
Leukocytes, UA: NEGATIVE
Nitrite, UA: NEGATIVE
Spec Grav, UA: 1.025
Urobilinogen, UA: 0.2 U/dL
pH, UA: 6.5

## 2017-11-25 LAB — GLUCOSE, POCT (MANUAL RESULT ENTRY): POC Glucose: 236 mg/dL — AB (ref 70–99)

## 2017-11-25 MED ORDER — GLIPIZIDE 5 MG PO TABS: 5.0000 mg | ORAL_TABLET | Freq: Every day | ORAL | 4 refills | Status: DC

## 2017-11-25 MED ORDER — LOSARTAN POTASSIUM 50 MG PO TABS: 50.0000 mg | ORAL_TABLET | Freq: Every day | ORAL | 4 refills | Status: DC

## 2017-11-25 MED ORDER — SULFAMETHOXAZOLE-TRIMETHOPRIM 800-160 MG PO TABS: 1.0000 | ORAL_TABLET | Freq: Two times a day (BID) | ORAL | 0 refills | Status: AC

## 2017-11-25 MED ORDER — AMLODIPINE BESYLATE 5 MG PO TABS: 5.0000 mg | ORAL_TABLET | Freq: Every day | ORAL | 4 refills | Status: DC

## 2017-11-25 MED ORDER — METFORMIN HCL 1000 MG PO TABS: 1000.0000 mg | ORAL_TABLET | Freq: Two times a day (BID) | ORAL | 4 refills | Status: DC

## 2017-11-25 MED ORDER — INSULIN NPH (HUMAN) (ISOPHANE) 100 UNIT/ML ~~LOC~~ SUSP: 10.0000 [IU] | Freq: Every day | SUBCUTANEOUS | 4 refills | Status: DC

## 2017-11-25 NOTE — Progress Notes (Signed)
Subjective:    Patient ID: April Fry, female    DOB: 02-Jun-1960, 57 y.o.   MRN: [id number]  HPI       57 year old female last seen in the office on 10/09/2016 in follow-up of type 2 diabetes and hypertension.  Patient presents today with chief complaint of multiple joint pain.  Patient's hemoglobin A1c on 10/09/2016 showed good control at 6.8.  At today's visit, patient's fingerstick glucose is 236 and patient's hemoglobin A1c is elevated at 10.3.  Patient also with prior LDL of 126 on 06/11/2016.      Patient reports at today's visit that she has had frequent urination for the past 3 weeks.  Patient is also has some increased mid back pain.  Patient denies fever or chills.  Patient has seen no change in color the urine and no increased odor.  Patient denies dysuria.  Patient states that she stopped checking her blood sugars about 6 months ago because they were always the same, ranging from about 1 10-1 30.  Patient denies blurred vision, patient with occasional increased thirst.  Patient has not taken her blood pressure medication in the past year.  Patient denies any headaches related to blood pressure but patient has had some recurrent dizziness.  Patient states that she has been taking 10 units of insulin once daily for her blood sugars.      Patient's chief complaint at today's visit is that she has had pain and swelling in multiple joints especially in her hands, wrist, elbows, knees and feet.  Patient states the pain is a 10-12 on a 0-to-10 scale.  Pain is dull and aching but sometimes sharp.  Patient sometimes feels as if her joints are also warm to touch.  Patient denies any family history of arthritis or rheumatoid arthritis.  Patient has taken over-the-counter naproxen for pain and states that this slightly lessens the pain but then after about 5 hours, the pain returns and is worse.  Patient reports no nausea, no abdominal pain with the use of naproxen.  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  Patient states that the pain is so bad sometimes that it causes her to cry.  Patient denies any numbness or tingling in her feet.   Past Medical History:  Diagnosis Date  . Diabetes mellitus without complication (HCC)   . Hypertension   . Pneumonia 05/21/2016   Past Surgical History:  Procedure Laterality Date  . ABDOMINAL HYSTERECTOMY     Social History   Tobacco Use  . Smoking status: Never Smoker  . Smokeless tobacco: Never Used  Substance Use Topics  . Alcohol use: No  . Drug use: No  No Known Allergies  Review of Systems  Constitutional: Positive for fatigue. Negative for chills, diaphoresis and fever.  HENT: Negative for sore throat and trouble swallowing.   Respiratory: Negative for cough and shortness of breath.   Cardiovascular: Negative for chest pain, palpitations and leg swelling.  Endocrine: Positive for polyuria. Negative for polydipsia and polyphagia.  Genitourinary: Positive for frequency. Negative for dysuria.  Musculoskeletal: Positive for arthralgias, back pain, joint swelling and myalgias. Negative for gait problem.  Neurological: Negative for dizziness and headaches.       Objective:   Physical Exam BP (!) 188/84   Pulse 76   Temp 98.3 F (36.8 C) (Oral)   Resp 18   Ht 5' (1.524 m)   Wt 192 lb 6.4 oz (87.3 kg)   SpO2 94%   BMI  37.58 kg/m Vital signs and nurse's notes are reviewed General-well-nourished, well-developed older overweight female in no acute distress ENT- TMs gray, mild edema of the nasal turbinates, mild posterior pharynx erythema Neck-supple, no lymphadenopathy, no thyromegaly, patient with a soft right carotid bruit Lungs-clear to auscultation bilaterally Cardiovascular-regular rate and rhythm Abdomen-soft, nontender. Back-no CVA tenderness, patient with some mild lumbosacral tenderness to palpation Musculoskeletal- patient does have tenderness and swelling in the bilateral hands and patient has some difficulty  making a fist with the right hand secondary to swelling.  Patient with tenderness to palpation over the bilateral MCP, PIP and DIP joints right greater than left patient also with some tenderness to palpation on the lateral epicondyles of the elbows, bilateral joint lines of the knees and patient with some mild pedal edema with discomfort with palpation along the toes and bases of the toes. Negative Tinel on the right and (mildly) positive on the left Diabetic foot exam- no active skin breakdown on the feet, normal monofilament exam.  Patient with some very mild pedal edema bilaterally right greater than left.  Patient with some dry skin on the heels.  Patient with 2+ dorsalis pedis and posterior tibial pulses Neuro-cranial nerves II through XII are grossly intact      Assessment & Plan:  1. Type 2 diabetes mellitus with hyperglycemia, without long-term current use of insulin (HCC) Patient with type 2 diabetes and patient states that she has been taking insulin 10 units daily however had not been taking her oral medications.  Her blood sugar this morning was 236 here in the clinic.  Patient's hemoglobin A1c was elevated at 10.3.  Patient reports urinary frequency.  Patient given new prescription for metformin and glipizide in addition to NPH insulin 10 units daily.  Patient was also asked to start monitoring her home blood sugars.  Patient will also have CMP and creatinine microalbumin ratio. - Glucose (CBG) - HgB A1c - Comprehensive metabolic panel - Microalbumin/Creatinine Ratio, Urine - VAS US CAROTID; Future - POCT URINALYSIS DIP (CLINITEK) - metFORMIN (GLUCOPHAGE) 1000 MG tablet; Take 1 tablet (1,000 mg total) by mouth 2 (two) times daily with a meal. To lower blood sugar  Dispense: 60 tablet; Refill: 4 - glipiZIDE (GLUCOTROL) 5 MG tablet; Take 1 tablet (5 mg total) by mouth daily before breakfast. To lower blood sugar  Dispense: 30 tablet; Refill: 4 - insulin NPH Human (HUMULIN N,NOVOLIN N)  100 UNIT/ML injection; Inject 0.1 mLs (10 Units total) into the skin daily before breakfast. To lower blood sugar  Dispense: 10 mL; Refill: 4  2. Multiple joint pain Patient with complaint of multiple joint pain and swelling which has started over the past few months.  Patient states that she works as a Financial risk analyst and has to use her hands a lot and has had increased pain and swelling in her hands, wrist, elbows, knees and feet.  Patient has also had some back pain.  Patient will have an arthritis panel at today's visit and will be referred to rheumatology.  Patient is also asked to obtain x-rays of her hands.  Patient may take OTC NSAIDs such as Aleve twice daily after meal to help with joint pain and swelling. - Arthritis Panel - Ambulatory referral to Rheumatology - DG Hand Complete Right; Future - DG Hand Complete Left; Future  3. Essential hypertension Patient's blood pressure is elevated at today's visit and she states that she has been out of her blood pressure medications for about a year.  Discussed the  importance of compliance with medications.  New prescriptions for Norvasc and Cozaar provided - amLODipine (NORVASC) 5 MG tablet; Take 1 tablet (5 mg total) by mouth daily. To lower blood pressure  Dispense: 30 tablet; Refill: 4 - losartan (COZAAR) 50 MG tablet; Take 1 tablet (50 mg total) by mouth daily. To control blood pressure  Dispense: 30 tablet; Refill: 4  4. Urinary frequency Patient with elevated blood sugars which may be causing her urinary frequency but will also check urinalysis to look for possible urinary tract infection- POCT URINALYSIS DIP (CLINITEK)  5. Dizziness Patient has been out of her blood pressure medicine for almost a year and this could be contributing to her dizziness.  Patient however also with a carotid bruit on exam and patient will be scheduled for carotid ultrasound.  Patient will also have electrolytes checked and has been restarted on her diabetes medicine.   Patient will have lipid panel at today's visit. - Lipid panel - VAS US CAROTID; Future  6. Right carotid bruit Patient with presence of right carotid bruit and patient will have lipid panel and will be scheduled for a vascular ultrasound - Lipid panel - VAS US CAROTID; Future  7. Hypercholesterolemia Patient with history of hyperlipidemia and patient will have lipid panel and prescription for cholesterol medication will be sent to her pharmacy based on the cholesterol panel results.  Patient was on simvastatin 10 mg in the past which she is no longer taking.- Lipid panel - VAS US CAROTID; Future  8. Need for immunization against influenza Patient was offered and agreed to have influenza immunization  An After Visit Summary was printed and given to the patient.  Return for  DM/HTN; 2-3 weeks with Franky Macho; 6 weeks with PCP.

## 2017-11-25 NOTE — Progress Notes (Signed)
Flu shot: yes Pain: bones, everyday, hurts a lot. everytime she moves, 2 weeks. Complaint of Frequent urination, 3wks .  1year off of amlodipine  236, bs

## 2017-11-26 LAB — COMPREHENSIVE METABOLIC PANEL WITH GFR
ALT: 23 IU/L (ref 0–32)
AST: 17 IU/L (ref 0–40)
Albumin/Globulin Ratio: 1.3 (ref 1.2–2.2)
Albumin: 4.1 g/dL (ref 3.5–5.5)
Alkaline Phosphatase: 94 IU/L (ref 39–117)
BUN/Creatinine Ratio: 22 (ref 9–23)
BUN: 11 mg/dL (ref 6–24)
Bilirubin Total: 0.4 mg/dL (ref 0.0–1.2)
CO2: 24 mmol/L (ref 20–29)
Calcium: 8.8 mg/dL (ref 8.7–10.2)
Chloride: 101 mmol/L (ref 96–106)
Creatinine, Ser: 0.5 mg/dL — ABNORMAL LOW (ref 0.57–1.00)
GFR calc Af Amer: 123 mL/min/1.73
GFR calc non Af Amer: 107 mL/min/1.73
Globulin, Total: 3.1 g/dL (ref 1.5–4.5)
Glucose: 232 mg/dL — ABNORMAL HIGH (ref 65–99)
Potassium: 4.7 mmol/L (ref 3.5–5.2)
Sodium: 140 mmol/L (ref 134–144)
Total Protein: 7.2 g/dL (ref 6.0–8.5)

## 2017-11-26 LAB — LIPID PANEL
Chol/HDL Ratio: 4.1 ratio (ref 0.0–4.4)
Cholesterol, Total: 199 mg/dL (ref 100–199)
HDL: 48 mg/dL
LDL Calculated: 128 mg/dL — ABNORMAL HIGH (ref 0–99)
Triglycerides: 113 mg/dL (ref 0–149)
VLDL Cholesterol Cal: 23 mg/dL (ref 5–40)

## 2017-11-26 LAB — MICROALBUMIN / CREATININE URINE RATIO
Creatinine, Urine: 89.9 mg/dL
Microalb/Creat Ratio: 70.3 mg/g{creat} — ABNORMAL HIGH (ref 0.0–30.0)
Microalbumin, Urine: 63.2 ug/mL

## 2017-11-26 LAB — ARTHRITIS PANEL
Anti Nuclear Antibody(ANA): NEGATIVE
Rheumatoid fact SerPl-aCnc: <10 [IU]/mL (ref 0.0–13.9)
Sed Rate: 44 mm/h — ABNORMAL HIGH (ref 0–40)
Uric Acid: 3.3 mg/dL (ref 2.5–7.1)

## 2017-11-26 MED ORDER — ROSUVASTATIN CALCIUM 10 MG PO TABS: 10.0000 mg | ORAL_TABLET | Freq: Every day | ORAL | 11 refills | Status: DC

## 2017-12-01 ENCOUNTER — Telehealth: Payer: Self-pay

## 2017-12-01 NOTE — Telephone Encounter (Signed)
-----   Message from Cain Saupe, MD sent at 11/26/2017  1:50 PM EDT ----- Please notify patient that her recent blood work showed a glucose of 232 but CMP was otherwise normal.  Patient's lipid panel showed a bad cholesterol/LDL level of 128 but because she is diabetic, her goal LDL is 70 or less.  I will send in a prescription for a cholesterol medication if she does not already have one.  On patient's arthritis panel, she did have a very mild increase in her sedimentation rate which is a nonspecific marker of inflammation.  Patient did have an abnormal microalbumin creatinine ratio suggestive of albuminuria or protein in the urine which can signal that there has been some early damage to the kidneys from the effects of diabetes or hypertension.  Patient needs to make sure that her blood pressure and blood sugars are controlled

## 2017-12-01 NOTE — Telephone Encounter (Signed)
Patient was called, patient daughter April Fry answered and stated she would relay the message for her mother to return the call.

## 2017-12-02 ENCOUNTER — Ambulatory Visit (HOSPITAL_COMMUNITY): Admission: RE | Admit: 2017-12-02 | Payer: Self-pay | Source: Ambulatory Visit

## 2017-12-02 ENCOUNTER — Ambulatory Visit (HOSPITAL_COMMUNITY)
Admission: RE | Admit: 2017-12-02 | Discharge: 2017-12-02 | Disposition: A | Discharge status: Home / Self Care | Payer: Self-pay | Source: Ambulatory Visit | Attending: Family Medicine | Admitting: Family Medicine

## 2017-12-02 ENCOUNTER — Other Ambulatory Visit: Payer: Self-pay | Admitting: Family Medicine

## 2017-12-02 DIAGNOSIS — R42 Dizziness and giddiness: Secondary | ICD-10-CM | POA: Insufficient documentation

## 2017-12-02 DIAGNOSIS — I10 Essential (primary) hypertension: Secondary | ICD-10-CM

## 2017-12-02 DIAGNOSIS — E7800 Pure hypercholesterolemia, unspecified: Secondary | ICD-10-CM | POA: Insufficient documentation

## 2017-12-02 DIAGNOSIS — E1165 Type 2 diabetes mellitus with hyperglycemia: Secondary | ICD-10-CM | POA: Insufficient documentation

## 2017-12-02 DIAGNOSIS — E785 Hyperlipidemia, unspecified: Secondary | ICD-10-CM | POA: Insufficient documentation

## 2017-12-02 DIAGNOSIS — R0989 Other specified symptoms and signs involving the circulatory and respiratory systems: Secondary | ICD-10-CM | POA: Insufficient documentation

## 2017-12-02 NOTE — Progress Notes (Signed)
Patient ID: April Fry, female   DOB: 07-14-1960, 57 y.o.   MRN: [id number]   Patient with recent carotid ultrasound showing 1 to 39% stenosis.  Patient will be contacted to CMA and patient will be asked to make sure that she is taking recently prescribed cholesterol medication rosuvastatin 10 mg daily.  Would also recommend repeating vascular ultrasound of the carotids in 12 months.

## 2017-12-02 NOTE — Progress Notes (Signed)
*  PRELIMINARY RESULTS* Vascular Ultrasound Carotid Duplex (Doppler) has been completed.  Findings suggest 1-39% internal carotid artery stenosis bilaterally. Vertebral arteries are patent with antegrade flow.  12/02/2017 11:15 AM Gertie Fey, MHA, RVT, RDCS, RDMS for Leta Jungling, RDCS

## 2017-12-08 ENCOUNTER — Encounter (HOSPITAL_COMMUNITY): Payer: Self-pay

## 2017-12-13 ENCOUNTER — Telehealth: Payer: Self-pay

## 2017-12-13 NOTE — Telephone Encounter (Signed)
Patients daughter was informed and verbalized understanding of the patient results, no further questions.

## 2017-12-13 NOTE — Telephone Encounter (Signed)
-----   Message from Cain Saupe, MD sent at 12/02/2017  6:14 PM EDT ----- Would also recommended repeating the carotid US in about 12 months

## 2017-12-15 NOTE — Progress Notes (Signed)
    S:    PCP: Dr. Jillyn Hidden  Patient arrives in good spirits. Presents for diabetes management at the request of Dr. Jillyn Hidden. Patient was referred on 11/25/17.  BP at that visit 188/84. Glucose and A1c at that visit resulted at 10.3 and 236.  Today, pt denies CP, HA. Does endorse blurred vision (baseline). Endorses occasional shortness of breath when working.  Family/Social History:  - FHx: no pertinent positives - Tobacco:  never smoker  -Alcohol: occasional  Insurance coverage/medication affordability:  - No insurance coverage  Patient denies adherence with medications.  Current diabetes medications include: glipizide 5 mg daily, Humulin N 10 units before breakfast, metformin 1000 mg BID Current hypertension medications include: amlodipine 5 mg, losartan 50 mg daily  Patient endorses hypoglycemic events. Reports low blood sugarin the morning.  Patient reported dietary habits: 2 meals/day Breakfast: coffee, bread Dinner: chicken  Patient-reported exercise habits:  - does not exercise   Patient denies polyuria, polydipsia, or polyphagia.  Patient reports visual changes.  O: BP in L arm after 5 minutes rest: 153/84, HR 69  POCT glucose: 193 Does not take at home. Reports meter being broken  Lab Results  Component Value Date   HGBA1C 10.3 (A) 11/25/2017   HGBA1C 10.3 11/25/2017   HGBA1C 10.3 (A) 11/25/2017   HGBA1C 10.3 (A) 11/25/2017   There were no vitals filed for this visit.  Lipid Panel     Component Value Date/Time   CHOL 199 11/25/2017 1040   TRIG 113 11/25/2017 1040   HDL 48 11/25/2017 1040   CHOLHDL 4.1 11/25/2017 1040   LDLCALC 128 (H) 11/25/2017 1040   Clinical ASCVD: No  10 year ASCVD risk: 15.8%  A/P: Diabetes longstanding currently uncontrolled based on A1c of 10.3. Patient is able to verbalize appropriate hypoglycemia management plan. Patient is adherent with medication.  Pt with morning hypoglycemia. She is taking NPH at night with glipizide  before breakfast. She has an A1c > 10 and is having some blurred vision. Injectable therapy warranted at this time. Would like to have patient obtain a GLP-1 RA but she is unable to d/t cost. Will stop NPH and glipizide. Patient can obtain Lantus samples from our pharmacy here. I will also send in testing supplies.   -Started Lantus 10 units before bedtime.  -Continued metformin 1000 mg BID -Discontinued glipizide, NPH -Extensively discussed pathophysiology of DM, recommended lifestyle interventions, dietary effects on glycemic control -Counseled on s/sx of and management of hypoglycemia. -HM: Pneumovax indicated, incorrectly received Prevnar - will address at next visit -Next A1C anticipated 02/2018.   ASCVD risk - primary prevention in patient with DM. Last LDL is not controlled. ASCVD risk score is not >20%  - moderate intensity statin indicated. -Continued rosuvastatin 10 mg.   Hypertension longstanding currently uncontrolled.  BP goal <130/80 mmHg. Patient is adherent with medication. Pt with alb:SCr ratio >30. ACE/ARB is indicated at this time.  -Increased amlodipine to 10 mg daily -Continued losartan 50 mg daily  Written patient instructions provided. Total time in face to face counseling 30 minutes.   Follow up Pharmacist Clinic Visit in 2 weeks.  Patient seen with:  Leanne Chang, PharmD Candidate [employer] Class of 2021    Butch Penny, PharmD, CPP Clinical Pharmacist [facility] [phone]

## 2017-12-16 ENCOUNTER — Ambulatory Visit: Payer: Self-pay | Attending: Family Medicine | Admitting: Pharmacist

## 2017-12-16 ENCOUNTER — Encounter: Payer: Self-pay | Admitting: Family Medicine

## 2017-12-16 ENCOUNTER — Encounter: Payer: Self-pay | Admitting: Pharmacist

## 2017-12-16 VITALS — BP 153/84 | HR 69

## 2017-12-16 DIAGNOSIS — E1165 Type 2 diabetes mellitus with hyperglycemia: Secondary | ICD-10-CM

## 2017-12-16 DIAGNOSIS — Z794 Long term (current) use of insulin: Secondary | ICD-10-CM | POA: Insufficient documentation

## 2017-12-16 DIAGNOSIS — I10 Essential (primary) hypertension: Secondary | ICD-10-CM

## 2017-12-16 DIAGNOSIS — E119 Type 2 diabetes mellitus without complications: Secondary | ICD-10-CM | POA: Insufficient documentation

## 2017-12-16 LAB — GLUCOSE, POCT (MANUAL RESULT ENTRY): POC Glucose: 193 mg/dL — AB (ref 70–99)

## 2017-12-16 MED ORDER — TRUE METRIX METER W/DEVICE KIT: PACK | 0 refills | Status: AC

## 2017-12-16 MED ORDER — LOSARTAN POTASSIUM 50 MG PO TABS: 50.0000 mg | ORAL_TABLET | Freq: Every day | ORAL | 3 refills | Status: DC

## 2017-12-16 MED ORDER — TRUEPLUS LANCETS 28G MISC: 11 refills | Status: DC

## 2017-12-16 MED ORDER — GLUCOSE BLOOD VI STRP: ORAL_STRIP | 12 refills | Status: DC

## 2017-12-16 MED ORDER — METFORMIN HCL 1000 MG PO TABS: 1000.0000 mg | ORAL_TABLET | Freq: Two times a day (BID) | ORAL | 3 refills | Status: DC

## 2017-12-16 MED ORDER — AMLODIPINE BESYLATE 10 MG PO TABS: 10.0000 mg | ORAL_TABLET | Freq: Every day | ORAL | 2 refills | Status: DC

## 2017-12-16 MED ORDER — INSULIN GLARGINE 100 UNIT/ML ~~LOC~~ SOLN: 10.0000 [IU] | Freq: Every day | SUBCUTANEOUS | 0 refills | Status: DC

## 2017-12-16 MED FILL — metFORMIN HCL 1000 MG TABS: 1000 | 30 days supply | Qty: 60 | Fill #0

## 2017-12-16 MED FILL — !LANTUS 100 UNITS/ML VIAL: 100 | 28 days supply | Qty: 10 | Fill #0

## 2017-12-16 MED FILL — TRUEplus LANCETS 28G MISC: 30 days supply | Qty: 100 | Fill #0

## 2017-12-16 MED FILL — AMLODIPINE BESYLATE 10 MG T: 10 | 30 days supply | Qty: 30 | Fill #0

## 2017-12-16 MED FILL — TRUE METRIX TEST STRIP: 30 days supply | Qty: 100 | Fill #0

## 2017-12-16 MED FILL — !TRUE METRIX BLOOD GLUCOSE: 365 days supply | Qty: 1 | Fill #0

## 2017-12-16 NOTE — Patient Instructions (Addendum)
Thank you for coming to see me today. Please do the following:  Por favor tome lo siguiente para la presin arterial: - amlodipino 10 mg una tableta por va oral antes de acostarse - losartn 50 mg una tableta por va oral todos los das por la [employer]  Por favor, tome lo siguiente para la diabetes: - metformina 1,000 mg una tableta por la maana y una tableta por la tarde - lantus insulin inyecta 10 unidades en el estmago antes de acostarse Deje de tomar glipizida 5 mg por la [place].

## 2017-12-30 ENCOUNTER — Ambulatory Visit: Payer: Self-pay | Attending: Family Medicine | Admitting: Pharmacist

## 2017-12-30 ENCOUNTER — Encounter: Payer: Self-pay | Admitting: Pharmacist

## 2017-12-30 VITALS — BP 141/80 | HR 76

## 2017-12-30 DIAGNOSIS — E1140 Type 2 diabetes mellitus with diabetic neuropathy, unspecified: Secondary | ICD-10-CM | POA: Insufficient documentation

## 2017-12-30 DIAGNOSIS — Z794 Long term (current) use of insulin: Secondary | ICD-10-CM | POA: Insufficient documentation

## 2017-12-30 DIAGNOSIS — E1165 Type 2 diabetes mellitus with hyperglycemia: Secondary | ICD-10-CM

## 2017-12-30 DIAGNOSIS — Z79899 Other long term (current) drug therapy: Secondary | ICD-10-CM | POA: Insufficient documentation

## 2017-12-30 DIAGNOSIS — I10 Essential (primary) hypertension: Secondary | ICD-10-CM | POA: Insufficient documentation

## 2017-12-30 DIAGNOSIS — H538 Other visual disturbances: Secondary | ICD-10-CM | POA: Insufficient documentation

## 2017-12-30 DIAGNOSIS — R631 Polydipsia: Secondary | ICD-10-CM | POA: Insufficient documentation

## 2017-12-30 LAB — GLUCOSE, POCT (MANUAL RESULT ENTRY): POC Glucose: 244 mg/dL — AB (ref 70–99)

## 2017-12-30 MED ORDER — INSULIN GLARGINE 100 UNIT/ML ~~LOC~~ SOLN: 16.0000 [IU] | Freq: Every day | SUBCUTANEOUS | 0 refills | Status: DC

## 2017-12-30 MED ORDER — LOSARTAN POTASSIUM 100 MG PO TABS: 100.0000 mg | ORAL_TABLET | Freq: Every day | ORAL | 2 refills | Status: DC

## 2017-12-30 MED ORDER — ROSUVASTATIN CALCIUM 20 MG PO TABS: 20.0000 mg | ORAL_TABLET | Freq: Every day | ORAL | 2 refills | Status: DC

## 2017-12-30 MED FILL — LOSARTAN POTASSIUM 100 MG T: 100 | 30 days supply | Qty: 30 | Fill #0

## 2017-12-30 MED FILL — ROSUVASTATIN CALCIUM 20 MG: 20 | 30 days supply | Qty: 30 | Fill #0

## 2017-12-30 NOTE — Patient Instructions (Addendum)
Thank you for coming to see me today. Please do the following:  1. Increase Lantus to 16 units daily. 2. Continue metformin. 3. Increase rosuvastatin to 20 mg daily (for cholesterol).  4. Continue amlodipine 10 mg. 5. Increase losartan to 100 mg.  6. Continue checking blood sugars at home. 7. Continue making the lifestyle changes we've discussed together during our visit. Diet and exercise play a significant role in improving your blood sugars.  8. Follow-up with PCP 01/10/18.    Spanish translation:   Gracias por venir a verme hoy. Por favor haga lo siguiente:   1. Aumenta Lantus a 16 unidades diarias.  2. Contina metformina.  3. Aumente la rosuvastatina a 20 mg diarios (para el colesterol).  4. Continuar con amlodipino 10 mg al da. 5. Aumentar losartn a 100 mg. 6. Contine revisando el azcar en la sangre en casa.  7. Continen haciendo los cambios de estilo de vida que hemos discutido juntos durante nuestra visita. La dieta y el ejercicio juegan un papel importante en la mejora del azcar en la sangre.  8. Seguimiento con PCP on December 2nd, 2019.

## 2017-12-30 NOTE — Progress Notes (Signed)
    S:    PCP: Dr. Jillyn HiddenFulp  Patient arrives in good spirits. Presents for diabetes management at the request of Dr. Jillyn HiddenFulp. Patient was referred on 11/25/17. I last saw her 12/16/17. Started Lantus and stopped NPH and glipizide. Continued metformin. Increased amlodipine.   Today, pt denies CP, HA. Does endorse blurred vision (baseline). Endorses occasional shortness of breath when working. Patient reports polydipsia. Endorses neuropathy. She denies hypoglycemia. She has improved her diet by limiting carbs and cutting back on soda. She does not exercise.   Patient reports adherence with medications.  Current diabetes medications include: Lantus 10 units, metformin 1000 mg BID Current hypertension medications include: amlodipine 10 mg, losartan 50 mg daily  Family/Social History:  - FHx: no pertinent positives - Tobacco:  never smoker  - Alcohol: occasional   O: BP in L arm after 5 minutes rest: 141/80, HR 76  POCT glucose: 244 Home fasting CBG: 188 - 231   Lab Results  Component Value Date   HGBA1C 10.3 (A) 11/25/2017   HGBA1C 10.3 11/25/2017   HGBA1C 10.3 (A) 11/25/2017   HGBA1C 10.3 (A) 11/25/2017   There were no vitals filed for this visit.  Lipid Panel     Component Value Date/Time   CHOL 199 11/25/2017 1040   TRIG 113 11/25/2017 1040   HDL 48 11/25/2017 1040   CHOLHDL 4.1 11/25/2017 1040   LDLCALC 128 (H) 11/25/2017 1040   Clinical ASCVD: No  10 year ASCVD risk: 10.7%  A/P: Diabetes longstanding currently uncontrolled based on A1c of 10.3. Patient is able to verbalize appropriate hypoglycemia management plan. Patient is adherent with medication. Pt without hypoglycemia since stopping glipizide and NPH. Will titrate Lantus to achieve fasting glucose goals. Recommend Victoza in the future if the patient requires additional A1c lowering.   -Increase Lantus to 16 units before bedtime.  -Continued metformin 1000 mg BID -Extensively discussed pathophysiology of DM,  recommended lifestyle interventions, dietary effects on glycemic control -Counseled on s/sx of and management of hypoglycemia. -HM: Pneumovax -Next A1C anticipated 02/2018.   ASCVD risk - primary prevention in patient with DM. Last LDL is not controlled. ASCVD risk score is not >20%  - moderate intensity statin indicated but will intensify today to meet LDL goal. -Increased rosuvastatin to 20 mg.   Hypertension longstanding currently uncontrolled.  BP goal <130/80 mmHg. Patient is adherent with medication. Pt with alb:SCr ratio >30.  -Continued amlodipine 10 mg daily -Increased losartan to 100 mg daily  Written patient instructions provided. Total time in face to face counseling 30 minutes.   Follow up w/ PCP 01/10/18.   Patient seen with:  Leanne ChangJane Chu, PharmD Candidate [employer] [employer] of 2021    Butch PennyLuke Van Ausdall, PharmD, CPP Clinical Pharmacist [facility] [phone]

## 2018-01-10 ENCOUNTER — Ambulatory Visit: Payer: Self-pay | Admitting: Family Medicine

## 2018-01-31 ENCOUNTER — Inpatient Hospital Stay (HOSPITAL_COMMUNITY): Payer: Self-pay

## 2018-01-31 ENCOUNTER — Emergency Department (HOSPITAL_COMMUNITY): Payer: Self-pay

## 2018-01-31 ENCOUNTER — Inpatient Hospital Stay (HOSPITAL_COMMUNITY)
Admission: EM | Admit: 2018-01-31 | Discharge: 2018-02-08 | DRG: 064 | Disposition: A | Discharge status: Home Health | Payer: Self-pay | Attending: Neurology | Admitting: Neurology

## 2018-01-31 ENCOUNTER — Encounter (HOSPITAL_COMMUNITY): Payer: Self-pay | Admitting: Internal Medicine

## 2018-01-31 DIAGNOSIS — Z7982 Long term (current) use of aspirin: Secondary | ICD-10-CM

## 2018-01-31 DIAGNOSIS — Z6833 Body mass index (BMI) 33.0-33.9, adult: Secondary | ICD-10-CM

## 2018-01-31 DIAGNOSIS — Z90710 Acquired absence of both cervix and uterus: Secondary | ICD-10-CM

## 2018-01-31 DIAGNOSIS — Z452 Encounter for adjustment and management of vascular access device: Secondary | ICD-10-CM

## 2018-01-31 DIAGNOSIS — R4182 Altered mental status, unspecified: Secondary | ICD-10-CM

## 2018-01-31 DIAGNOSIS — E1151 Type 2 diabetes mellitus with diabetic peripheral angiopathy without gangrene: Secondary | ICD-10-CM | POA: Diagnosis present

## 2018-01-31 DIAGNOSIS — G936 Cerebral edema: Secondary | ICD-10-CM | POA: Diagnosis present

## 2018-01-31 DIAGNOSIS — E1165 Type 2 diabetes mellitus with hyperglycemia: Secondary | ICD-10-CM | POA: Diagnosis present

## 2018-01-31 DIAGNOSIS — E876 Hypokalemia: Secondary | ICD-10-CM | POA: Diagnosis present

## 2018-01-31 DIAGNOSIS — I10 Essential (primary) hypertension: Secondary | ICD-10-CM | POA: Diagnosis present

## 2018-01-31 DIAGNOSIS — E785 Hyperlipidemia, unspecified: Secondary | ICD-10-CM | POA: Diagnosis present

## 2018-01-31 DIAGNOSIS — G919 Hydrocephalus, unspecified: Secondary | ICD-10-CM | POA: Diagnosis present

## 2018-01-31 DIAGNOSIS — Z794 Long term (current) use of insulin: Secondary | ICD-10-CM

## 2018-01-31 DIAGNOSIS — I161 Hypertensive emergency: Secondary | ICD-10-CM | POA: Diagnosis present

## 2018-01-31 DIAGNOSIS — I63441 Cerebral infarction due to embolism of right cerebellar artery: Principal | ICD-10-CM | POA: Diagnosis present

## 2018-01-31 DIAGNOSIS — R739 Hyperglycemia, unspecified: Secondary | ICD-10-CM

## 2018-01-31 DIAGNOSIS — E6609 Other obesity due to excess calories: Secondary | ICD-10-CM | POA: Diagnosis present

## 2018-01-31 DIAGNOSIS — I639 Cerebral infarction, unspecified: Secondary | ICD-10-CM

## 2018-01-31 DIAGNOSIS — R471 Dysarthria and anarthria: Secondary | ICD-10-CM | POA: Diagnosis present

## 2018-01-31 DIAGNOSIS — Z8249 Family history of ischemic heart disease and other diseases of the circulatory system: Secondary | ICD-10-CM

## 2018-01-31 DIAGNOSIS — E119 Type 2 diabetes mellitus without complications: Secondary | ICD-10-CM

## 2018-01-31 DIAGNOSIS — R002 Palpitations: Secondary | ICD-10-CM | POA: Diagnosis present

## 2018-01-31 DIAGNOSIS — Z79899 Other long term (current) drug therapy: Secondary | ICD-10-CM

## 2018-01-31 DIAGNOSIS — I119 Hypertensive heart disease without heart failure: Secondary | ICD-10-CM | POA: Diagnosis present

## 2018-01-31 DIAGNOSIS — T829XXA Unspecified complication of cardiac and vascular prosthetic device, implant and graft, initial encounter: Secondary | ICD-10-CM

## 2018-01-31 DIAGNOSIS — R29717 NIHSS score 17: Secondary | ICD-10-CM | POA: Diagnosis present

## 2018-01-31 LAB — I-STAT CG4 LACTIC ACID, ED
Lactic Acid, Venous: 2.82 mmol/L (ref 0.5–1.9)
Lactic Acid, Venous: 1.45 mmol/L (ref 0.5–1.9)

## 2018-01-31 LAB — I-STAT CHEM 8, ED
BUN: 8 mg/dL (ref 6–20)
Calcium, Ion: 1.09 mmol/L — ABNORMAL LOW (ref 1.15–1.40)
Chloride: 103 mmol/L (ref 98–111)
Creatinine, Ser: 0.3 mg/dL — ABNORMAL LOW (ref 0.44–1.00)
Glucose, Bld: 323 mg/dL — ABNORMAL HIGH (ref 70–99)
HCT: 44 % (ref 36.0–46.0)
Hemoglobin: 15 g/dL (ref 12.0–15.0)
Potassium: 3.9 mmol/L (ref 3.5–5.1)
Sodium: 137 mmol/L (ref 135–145)
TCO2: 24 mmol/L (ref 22–32)

## 2018-01-31 LAB — RAPID URINE DRUG SCREEN, HOSP PERFORMED
Amphetamines: NOT DETECTED
Barbiturates: NOT DETECTED
Benzodiazepines: NOT DETECTED
Cocaine: NOT DETECTED
Opiates: NOT DETECTED
Tetrahydrocannabinol: NOT DETECTED

## 2018-01-31 LAB — I-STAT TROPONIN, ED: TROPONIN I, POC: 0.02 ng/mL (ref 0.00–0.08)

## 2018-01-31 LAB — COMPREHENSIVE METABOLIC PANEL
ALT: 20 U/L (ref 0–44)
AST: 26 U/L (ref 15–41)
Albumin: 4.3 g/dL (ref 3.5–5.0)
Alkaline Phosphatase: 67 U/L (ref 38–126)
Anion gap: 13 (ref 5–15)
BUN: 8 mg/dL (ref 6–20)
CO2: 21 mmol/L — AB (ref 22–32)
CREATININE: 0.58 mg/dL (ref 0.44–1.00)
Calcium: 8.9 mg/dL (ref 8.9–10.3)
Chloride: 101 mmol/L (ref 98–111)
GFR calc Af Amer: >60 mL/min (ref >60)
GFR calc non Af Amer: >60 mL/min (ref >60)
Glucose, Bld: 319 mg/dL — ABNORMAL HIGH (ref 70–99)
Potassium: 4.1 mmol/L (ref 3.5–5.1)
Sodium: 135 mmol/L (ref 135–145)
Total Bilirubin: 1.7 mg/dL — ABNORMAL HIGH (ref 0.3–1.2)
Total Protein: 7.9 g/dL (ref 6.5–8.1)

## 2018-01-31 LAB — CBC WITH DIFFERENTIAL/PLATELET
Abs Immature Granulocytes: 0.05 10*3/uL (ref 0.00–0.07)
BASOS ABS: 0 10*3/uL (ref 0.0–0.1)
Basophils Relative: 0 %
EOS PCT: 0 %
Eosinophils Absolute: 0 10*3/uL (ref 0.0–0.5)
HCT: 42.2 % (ref 36.0–46.0)
Hemoglobin: 13.8 g/dL (ref 12.0–15.0)
Immature Granulocytes: 1 %
Lymphocytes Relative: 11 %
Lymphs Abs: 1.1 10*3/uL (ref 0.7–4.0)
MCH: 28.9 pg (ref 26.0–34.0)
MCHC: 32.7 g/dL (ref 30.0–36.0)
MCV: 88.5 fL (ref 80.0–100.0)
Monocytes Absolute: 0.3 10*3/uL (ref 0.1–1.0)
Monocytes Relative: 3 %
Neutro Abs: 8.5 10*3/uL — ABNORMAL HIGH (ref 1.7–7.7)
Neutrophils Relative %: 85 %
Platelets: 237 10*3/uL (ref 150–400)
RBC: 4.77 MIL/uL (ref 3.87–5.11)
RDW: 12.5 % (ref 11.5–15.5)
WBC: 10 10*3/uL (ref 4.0–10.5)
nRBC: 0 % (ref 0.0–0.2)

## 2018-01-31 LAB — URINALYSIS, MICROSCOPIC (REFLEX)

## 2018-01-31 LAB — CBG MONITORING, ED
Glucose-Capillary: 309 mg/dL — ABNORMAL HIGH (ref 70–99)
Glucose-Capillary: 251 mg/dL — ABNORMAL HIGH (ref 70–99)
Glucose-Capillary: 251 mg/dL — ABNORMAL HIGH (ref 70–99)

## 2018-01-31 LAB — I-STAT VENOUS BLOOD GAS, ED
Acid-Base Excess: 1 mmol/L (ref 0.0–2.0)
Bicarbonate: 26.3 mmol/L (ref 20.0–28.0)
O2 Saturation: 83 %
PCO2 VEN: 42.3 mmHg — AB (ref 44.0–60.0)
TCO2: 28 mmol/L (ref 22–32)
pH, Ven: 7.401 (ref 7.250–7.430)
pO2, Ven: 48 mmHg — ABNORMAL HIGH (ref 32.0–45.0)

## 2018-01-31 LAB — ETHANOL: Alcohol, Ethyl (B): <10 mg/dL (ref <10)

## 2018-01-31 LAB — GLUCOSE, CAPILLARY
Glucose-Capillary: 212 mg/dL — ABNORMAL HIGH (ref 70–99)
Glucose-Capillary: 181 mg/dL — ABNORMAL HIGH (ref 70–99)
Glucose-Capillary: 155 mg/dL — ABNORMAL HIGH (ref 70–99)
Glucose-Capillary: 136 mg/dL — ABNORMAL HIGH (ref 70–99)

## 2018-01-31 LAB — PROTIME-INR
INR: 1.06
Prothrombin Time: 13.7 seconds (ref 11.4–15.2)

## 2018-01-31 LAB — SODIUM
Sodium: 137 mmol/L (ref 135–145)
Sodium: 139 mmol/L (ref 135–145)

## 2018-01-31 LAB — URINALYSIS, ROUTINE W REFLEX MICROSCOPIC
Bilirubin Urine: NEGATIVE
Glucose, UA: >=500 mg/dL — AB
Ketones, ur: 40 mg/dL — AB
Leukocytes, UA: NEGATIVE
Nitrite: NEGATIVE
Protein, ur: 100 mg/dL — AB
Specific Gravity, Urine: 1.015 (ref 1.005–1.030)
pH: 6.5 (ref 5.0–8.0)

## 2018-01-31 LAB — LIPASE, BLOOD: Lipase: 19 U/L (ref 11–51)

## 2018-01-31 LAB — I-STAT BETA HCG BLOOD, ED (MC, WL, AP ONLY): I-stat hCG, quantitative: <5 m[IU]/mL (ref <5)

## 2018-01-31 LAB — APTT: aPTT: 21 seconds — ABNORMAL LOW (ref 24–36)

## 2018-01-31 LAB — MRSA PCR SCREENING: MRSA by PCR: NEGATIVE

## 2018-01-31 MED ORDER — SODIUM CHLORIDE 3 % IV SOLN
INTRAVENOUS | Status: AC
  Administered 2018-01-31 – 2018-02-01 (×4): 75 mL/h via INTRAVENOUS
  Filled 2018-01-31 (×7): qty 500

## 2018-01-31 MED ORDER — IOPAMIDOL (ISOVUE-370) INJECTION 76%
100.0000 mL | Freq: Once | INTRAVENOUS | Status: AC | PRN
  Administered 2018-01-31: 100 mL via INTRAVENOUS

## 2018-01-31 MED ORDER — ACETAMINOPHEN 650 MG RE SUPP
650.0000 mg | RECTAL | Status: DC | PRN
  Administered 2018-01-31: 650 mg via RECTAL
  Filled 2018-01-31: qty 1

## 2018-01-31 MED ORDER — SENNOSIDES-DOCUSATE SODIUM 8.6-50 MG PO TABS: 1.0000 | ORAL_TABLET | Freq: Every evening | ORAL | Status: DC | PRN

## 2018-01-31 MED ORDER — SODIUM CHLORIDE 0.9 % IV BOLUS
2000.0000 mL | Freq: Once | INTRAVENOUS | Status: AC
  Administered 2018-01-31: 2000 mL via INTRAVENOUS

## 2018-01-31 MED ORDER — FENTANYL CITRATE (PF) 100 MCG/2ML IJ SOLN
50.0000 ug | Freq: Once | INTRAMUSCULAR | Status: AC
  Administered 2018-01-31: 50 ug via INTRAVENOUS
  Filled 2018-01-31: qty 2

## 2018-01-31 MED ORDER — LABETALOL HCL 5 MG/ML IV SOLN
10.0000 mg | INTRAVENOUS | Status: DC | PRN
  Administered 2018-01-31 – 2018-02-02 (×3): 10 mg via INTRAVENOUS
  Filled 2018-01-31 (×4): qty 4

## 2018-01-31 MED ORDER — INSULIN ASPART 100 UNIT/ML ~~LOC~~ SOLN
2.0000 [IU] | SUBCUTANEOUS | Status: DC
  Administered 2018-01-31: 6 [IU] via SUBCUTANEOUS

## 2018-01-31 MED ORDER — ACETAMINOPHEN 160 MG/5ML PO SOLN: 650.0000 mg | ORAL | Status: DC | PRN

## 2018-01-31 MED ORDER — IOPAMIDOL (ISOVUE-370) INJECTION 76%
INTRAVENOUS | Status: AC
  Filled 2018-01-31: qty 100

## 2018-01-31 MED ORDER — STROKE: EARLY STAGES OF RECOVERY BOOK
Freq: Once | Status: DC
  Filled 2018-01-31 (×4): qty 1

## 2018-01-31 MED ORDER — SODIUM CHLORIDE 0.9 % IV SOLN: INTRAVENOUS | Status: DC

## 2018-01-31 MED ORDER — INSULIN REGULAR(HUMAN) IN NACL 100-0.9 UT/100ML-% IV SOLN
INTRAVENOUS | Status: DC
  Administered 2018-01-31: 1.5 [IU]/h via INTRAVENOUS
  Filled 2018-01-31: qty 100

## 2018-01-31 MED ORDER — LABETALOL HCL 5 MG/ML IV SOLN: 10.0000 mg | Freq: Once | INTRAVENOUS | Status: DC

## 2018-01-31 MED ORDER — ACETAMINOPHEN 325 MG PO TABS
650.0000 mg | ORAL_TABLET | ORAL | Status: DC | PRN
  Administered 2018-02-01 – 2018-02-07 (×4): 650 mg via ORAL
  Filled 2018-01-31 (×4): qty 2

## 2018-01-31 NOTE — ED Notes (Signed)
Neurology PA bedside.

## 2018-01-31 NOTE — ED Notes (Signed)
Daughter states that patient was at baseline at 1900 after she got home from work. Then, patient went to dinner with her husband and began to not feel well. Daughter states that patient had unsteady gait, vomited, and had blurred vision. This morning, patient developed severe headache and had altered mental status at 0930 today.

## 2018-01-31 NOTE — ED Notes (Addendum)
Pt transported to MRI. Windell Mouldinguth, RN to be with patient during exam.

## 2018-01-31 NOTE — ED Notes (Signed)
Please call daughters with updates: Donata ClayKarina [phone] Byrd HesselbachMaria [phone]

## 2018-01-31 NOTE — H&P (Addendum)
Neurology history and physical   CC: Right cerebellar infarct  History is obtained from: Daughter  HPI: April Fry is a 57 y.o. female with history of hypertension, diabetes.  Per daughter patient was normal until approximately 2100 hrs. when she and her husband had not eaten she stated that she did not feel well.  At that time she was having difficulty walking, had nausea vomiting and stated the room was spinning.  At the time she went to bed.  She awoke and was having the same symptoms per daughter.  At approximately 0900 hrs. this morning she was noted to have a significant change in mental status.  For that reason patient was brought to the emergency department.  CT scan showed a large area of low density within the right cerebellar hemisphere compatible with infarct.  The nature of this infarct is subacute and involves much of the entire right PICA territory.  The infarct measures 3.8 x 4.1 cm.  Patient currently is very lethargic and follows only minimal commands.  LKW: 1100 hrs. on 01/30/2018 tpa given?: no, out of window Premorbid modified Rankin scale (mRS): 0 NIH stroke scale of 17  ROS:  Unable to obtain due to altered mental status.   Past Medical History:  Diagnosis Date  . Diabetes mellitus without complication ([place])   . Hypertension   . Pneumonia 05/21/2016   No family history on file.  Social History:   reports that she has never smoked. She has never used smokeless tobacco. She reports current alcohol use. She reports that she does not use drugs.  Medications  Current Facility-Administered Medications:  .   stroke: mapping our early stages of recovery book, , Does not apply, Once, Marliss Coots, PA-C .  0.9 %  sodium chloride infusion, , Intravenous, Continuous, Marliss Coots, PA-C .  acetaminophen (TYLENOL) tablet 650 mg, 650 mg, Oral, Q4H PRN **OR** acetaminophen (TYLENOL) solution 650 mg, 650 mg, Per Tube, Q4H PRN **OR** acetaminophen (TYLENOL)  suppository 650 mg, 650 mg, Rectal, Q4H PRN, Marliss Coots, PA-C .  fentaNYL (SUBLIMAZE) injection 50 mcg, 50 mcg, Intravenous, Once, Ford, Kelsey N, PA-C .  pneumococcal 13-valent conjugate vaccine (PREVNAR 13) injection 0.5 mL, 0.5 mL, Intramuscular, Once, Alfonse Spruce, FNP .  senna-docusate (Senokot-S) tablet 1 tablet, 1 tablet, Oral, QHS PRN, Marliss Coots, PA-C .  sodium chloride (hypertonic) 3 % solution, , Intravenous, Continuous, Keith Felten, Lanice Schwab, MD  Current Outpatient Medications:  .  amLODipine (NORVASC) 10 MG tablet, Take 1 tablet (10 mg total) by mouth daily., Disp: 30 tablet, Rfl: 2 .  insulin glargine (LANTUS) 100 UNIT/ML injection, Inject 0.16 mLs (16 Units total) into the skin at bedtime., Disp: 10 mL, Rfl: 0 .  losartan (COZAAR) 100 MG tablet, Take 1 tablet (100 mg total) by mouth daily., Disp: 30 tablet, Rfl: 2 .  metFORMIN (GLUCOPHAGE) 1000 MG tablet, Take 1 tablet (1,000 mg total) by mouth 2 (two) times daily with a meal., Disp: 180 tablet, Rfl: 3 .  rosuvastatin (CRESTOR) 20 MG tablet, Take 1 tablet (20 mg total) by mouth daily., Disp: 30 tablet, Rfl: 2 .  blood glucose meter kit and supplies KIT, Dispense based on patient and insurance preference. Use up to four times daily as directed. (FOR ICD-9 250.00, 250.01). (Patient not taking: Reported on 11/25/2017), Disp: 1 each, Rfl: 0 .  Blood Glucose Monitoring Suppl (TRUE METRIX METER) w/Device KIT, Use to check blood sugar in the morning., Disp: 1 kit, Rfl: 0 .  ciclopirox (PENLAC) 8 % solution, Apply topically at bedtime. Apply over nail and surrounding skin. Apply daily over previous coat. After seven (7) days, may remove with alcohol and continue cycle. (Patient not taking: Reported on 11/25/2017), Disp: 6.6 mL, Rfl: 2 .  glucose blood (TRUE METRIX BLOOD GLUCOSE TEST) test strip, Use as instructed, Disp: 100 each, Rfl: 12 .  TRUEPLUS LANCETS 28G MISC, Use to check blood sugar daily., Disp: 100 each, Rfl:  11   Exam: Current vital signs: BP (!) 172/91   Pulse 95   Temp 98 F (36.7 C) (Rectal)   Resp 17   SpO2 99%  Vital signs in last 24 hours: Temp:  [97.8 F (36.6 C)-98 F (36.7 C)] 98 F (36.7 C) (12/23 1115) Pulse Rate:  [92-95] 95 (12/23 1200) Resp:  [13-19] 17 (12/23 1200) BP: (163-177)/(78-94) 172/91 (12/23 1200) SpO2:  [96 %-99 %] 99 % (12/23 1200)  Physical Exam  Constitutional: Appears well-developed and well-nourished.  Psych: Affect appropriate to situation Eyes: No scleral injection HENT: No OP obstrucion Head: Normocephalic.  Cardiovascular: Normal rate and regular rhythm.  Respiratory: Effort normal, non-labored breathing GI: Soft.  No distension. There is no tenderness.  Skin: WDI  Neuro: Mental Status: Patient is severely lethargic, only responds to noxious stimuli and or intermittently will follow simple commands such as squeezing my hand.  I could not get her to blink to threat or stick her tongue out but apparently just before me Dr. Lorraine Lax was able to. Cranial Nerves: II: Blinks to threat  III,IV, VI: Doll's are intact and patient is able look left to right when her voice is called. pupils are equal, round, and reactive to light.   V: Facial sensation is symmetric to temperature VII: Facial movement is symmetric.  VIII: hearing is intact to voice XII: tongue is midline without atrophy or fasciculations.  Motor: Normal tone and muscle bulk.  Patient is able to lift arms and legs bilaterally with a 2/5 strength Sensory: Withdraws to noxious stimuli in all 4 extremities Deep Tendon Reflexes: 1+ throughout Plantars: Mute  Labs I have reviewed labs in epic and the results pertinent to this consultation are:   CBC    Component Value Date/Time   WBC 10.0 01/31/2018 1035   RBC 4.77 01/31/2018 1035   HGB 15.0 01/31/2018 1045   HCT 44.0 01/31/2018 1045   PLT 237 01/31/2018 1035   MCV 88.5 01/31/2018 1035   MCH 28.9 01/31/2018 1035   MCHC 32.7  01/31/2018 1035   RDW 12.5 01/31/2018 1035   LYMPHSABS 1.1 01/31/2018 1035   MONOABS 0.3 01/31/2018 1035   EOSABS 0.0 01/31/2018 1035   BASOSABS 0.0 01/31/2018 1035    CMP     Component Value Date/Time   NA 137 01/31/2018 1045   NA 140 11/25/2017 1040   K 3.9 01/31/2018 1045   CL 103 01/31/2018 1045   CO2 21 (L) 01/31/2018 1035   GLUCOSE 323 (H) 01/31/2018 1045   BUN 8 01/31/2018 1045   BUN 11 11/25/2017 1040   CREATININE 0.30 (L) 01/31/2018 1045   CALCIUM 8.9 01/31/2018 1035   PROT 7.9 01/31/2018 1035   PROT 7.2 11/25/2017 1040   ALBUMIN 4.3 01/31/2018 1035   ALBUMIN 4.1 11/25/2017 1040   AST 26 01/31/2018 1035   ALT 20 01/31/2018 1035   ALKPHOS 67 01/31/2018 1035   BILITOT 1.7 (H) 01/31/2018 1035   BILITOT 0.4 11/25/2017 1040   GFRNONAA >60 01/31/2018 1035   GFRAA >60 01/31/2018  1035    Lipid Panel     Component Value Date/Time   CHOL 199 11/25/2017 1040   TRIG 113 11/25/2017 1040   HDL 48 11/25/2017 1040   CHOLHDL 4.1 11/25/2017 1040   LDLCALC 128 (H) 11/25/2017 1040     Imaging I have reviewed the images obtained:  CT-scan of the brain-Large area of low density within the right cerebral hemisphere is identified compatible with infarct. This is likely subacute in nature and involves much of the entire right PICA territory.    Etta Quill PA-C [facility] [phone]  M-F  (9:00 am- 5:00 PM)  01/31/2018, 12:27 PM   NEUROHOSPITALIST ADDENDUM Performed a face to face diagnostic evaluation.   I have reviewed the contents of history and physical exam as documented by PA/ARNP/Resident and agree with above documentation.  I have discussed and formulated the plan as documented below. Edits to the note have been made as needed.  On examination, patient is somnolent arousable.  Moves all 4 extremities. Appears confused and keeps stating that her head hurts.  Follows commands. Blinks to threat bilaterally.     Assessment: 57 year old female with  a large right cerebellar infarct with mass effect and compression of the fourth ventricle.   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  Etiology: athero vs cardioembolic #Plan: Admit to ICU # CTA head and neck: open basilar artery  # MRI of the brain without contrast #Transthoracic Echo  # Hold ASA as patient may need neurosurgery  # BP goal: permissive HTN upto 887 systolic  # HBAIC and Lipid profile # Telemetry monitoring # Frequent neuro checks # NPO until passes stroke swallow screen  Cytotoxic cerebral edema -We will start hypertonic saline -Goal sodium 150-1 37 -Neurosurgery was consulted for evaluation for decompressive suboccpital craniectomy -MRI  brain ordered for 4 PM  Dysarthria -NPO until cleared by speech -ST -Advance diet as tolerated   Hyperlipidemia, unspecified  - Statin for goal LDL < 70   ENDO Type 2 diabetes mellitus with hyperglycemia  -SSI -goal HgbA1c < 7 toxic agents   Fluid/Electrolyte Disorders -Replete -Hypertonic saline s   Prophylaxis DVT: SCDs Bowel: Senokot  Diet: NPO until cleared by speech  Code Status: Full Code  Comfort Measures DNR       Karena Addison Kaiden Dardis MD [facility] [phone]   If 7pm to 7am, please call on call as listed on AMION.    This patient is neurologically critically ill due to large right cerebellar infarct with cerebral edema.  She is at risk for significant risk of neurological worsening from cerebral edema,  death from brain herniation, heart failure, hemorrhagic conversion, infection, respiratory failure and seizure. This patient's care requires constant monitoring of vital signs, hemodynamics, respiratory and cardiac monitoring, review of multiple databases, neurological assessment, discussion with family, other specialists including Neurosurgery and medical decision making of high complexity.  I spent 60  minutes of neurocritical time in the care of  this patient.

## 2018-01-31 NOTE — Progress Notes (Signed)
Inpatient Diabetes Program Recommendations  AACE/ADA: New Consensus Statement on Inpatient Glycemic Control (2015)  Target Ranges:  Prepandial:   less than 140 mg/dL      Peak postprandial:   less than 180 mg/dL (1-2 hours)      Critically ill patients:  140 - 180 mg/dL   Lab Results  Component Value Date   GLUCAP 309 (H) 01/31/2018   HGBA1C 10.3 (A) 11/25/2017   HGBA1C 10.3 11/25/2017   HGBA1C 10.3 (A) 11/25/2017   HGBA1C 10.3 (A) 11/25/2017    Diabetes history: Type 2 DM Outpatient Diabetes medications: Lantus 16 units QHS, Metformin 1000 mg BID Current orders for Inpatient glycemic control: Novolog 2-6 units Q4H  Inpatient Diabetes Program Recommendations:    Consider adding Lantus 8 units QHS.   Thanks, Lujean RaveLauren Dyland Panuco, MSN, RNC-OB Diabetes Coordinator [phone] (8a-5p)

## 2018-01-31 NOTE — Progress Notes (Signed)
Carotid duplex complete. Please see CV Proc tab for preliminary results. Levin Baconlaire Kymorah Korf- RDMS, RVT 4:12 PM  01/31/2018

## 2018-01-31 NOTE — ED Provider Notes (Signed)
[facility] Provider Note   CSN: [id number] Arrival date & time: 01/31/18  1014     History   Chief Complaint Chief Complaint  Patient presents with  . Altered Mental Status  . Hyperglycemia    HPI April Fry is a 57 y.o. female.  April Fry is a 57 y.o. female with a history of diabetes and hypertension, who presents to the emergency department via EMS for evaluation of altered mental status and hyperglycemia.  On arrival patient is acutely altered and able to provide minimal verbal responses, patient is also Spanish-speaking which further complicates history.  Already of the history obtained from EMS and patient's daughter.  Per EMS they were called to the scene due to hyperglycemia and on arrival the patient was acutely altered, somnolent but arousable and able to provide some verbal responses.  Noted to be hyperglycemic with CBG of 350 for EMS, known history of type 2 diabetes but per family patient takes her medications regularly and sugars usually run in the 150s.  Daughter reports that she last saw her mother yesterday morning and afternoon and she was normal and well-appearing, not complaining of anything, appeared her normal self.  She reports that last night around 7 PM after returning home from work per the patient's husband she began to feel unwell and developed a headache.  On arrival to the emergency department patient complains of headache but is unable to elaborate.  Is unable to state her name or tell me which family members are here with her today.  Patient repeatedly complaining of headache.  Per EMS patient was able to slide herself onto the stretcher and was moving all extremities spontaneously when prompted, but was somnolent through most of transport, but protecting her airway.  Patient denies any chest pain or shortness of breath, endorses some abdominal pain but is unable to specify where this is located.  Patient  also endorses some vision change but is unable to state if this is occurring at all times, intermittently and if it is with both eyes or one.  Patient has not had any episodes of vomiting.  Level 5 caveat: Altered mental status     Past Medical History:  Diagnosis Date  . Diabetes mellitus without complication ([place])   . Hypertension   . Pneumonia 05/21/2016    Patient Active Problem List   Diagnosis Date Noted  . Hyperlipidemia associated with type 2 diabetes mellitus ([place]) 06/17/2016  . Diabetes mellitus ([place]) 05/21/2016  . HTN (hypertension) 05/21/2016  . LBBB (left bundle branch block) 05/21/2016    Past Surgical History:  Procedure Laterality Date  . ABDOMINAL HYSTERECTOMY       OB History   No obstetric history on file.      Home Medications    Prior to Admission medications   Medication Sig Start Date End Date Taking? Authorizing Provider  amLODipine (NORVASC) 10 MG tablet Take 1 tablet (10 mg total) by mouth daily. 12/16/17   Fulp, Cammie, MD  blood glucose meter kit and supplies KIT Dispense based on patient and insurance preference. Use up to four times daily as directed. (FOR ICD-9 250.00, 250.01). Patient not taking: Reported on 11/25/2017 05/23/16   Velvet Bathe, MD  Blood Glucose Monitoring Suppl (TRUE METRIX METER) w/Device KIT Use to check blood sugar in the morning. 12/16/17   Fulp, Cammie, MD  ciclopirox (PENLAC) 8 % solution Apply topically at bedtime. Apply over nail and surrounding skin. Apply daily over previous coat.  After seven (7) days, may remove with alcohol and continue cycle. Patient not taking: Reported on 11/25/2017 07/22/16   Alfonse Spruce, FNP  glucose blood (TRUE METRIX BLOOD GLUCOSE TEST) test strip Use as instructed 12/16/17   Fulp, Cammie, MD  insulin glargine (LANTUS) 100 UNIT/ML injection Inject 0.16 mLs (16 Units total) into the skin at bedtime. 12/30/17   Fulp, Cammie, MD  losartan (COZAAR) 100 MG tablet Take 1 tablet (100 mg  total) by mouth daily. 12/30/17   Fulp, Cammie, MD  metFORMIN (GLUCOPHAGE) 1000 MG tablet Take 1 tablet (1,000 mg total) by mouth 2 (two) times daily with a meal. 12/16/17   Fulp, Cammie, MD  rosuvastatin (CRESTOR) 20 MG tablet Take 1 tablet (20 mg total) by mouth daily. 12/30/17   Fulp, Cammie, MD  TRUEPLUS LANCETS 28G MISC Use to check blood sugar daily. 12/16/17   Antony Blackbird, MD    Family History No family history on file.  Social History Social History   Tobacco Use  . Smoking status: Never Smoker  . Smokeless tobacco: Never Used  Substance Use Topics  . Alcohol use: Yes    Comment: occasionally  . Drug use: No     Allergies   Patient has no known allergies.   Review of Systems Review of Systems  Unable to perform ROS: Mental status change     Physical Exam Updated Vital Signs BP (!) 177/78   Pulse 92   Temp 97.8 F (36.6 C) (Oral)   Resp 16   SpO2 99%   Physical Exam Constitutional:      Comments: Patient is diaphoretic, somnolent but responsive to voice and sternal rub and appears acutely ill  HENT:     Head: Normocephalic and atraumatic.     Comments: No palpable hematoma, deformity or step-off over the scalp to suggest head trauma    Mouth/Throat:     Mouth: Mucous membranes are moist.     Pharynx: Oropharynx is clear.  Eyes:     Extraocular Movements: Extraocular movements intact.     Pupils: Pupils are equal, round, and reactive to light.     Comments: Pupils are equal and reactive bilaterally and patient tracking appropriately, no gaze deviation.  Neck:     Musculoskeletal: Neck supple.  Cardiovascular:     Rate and Rhythm: Normal rate and regular rhythm.     Pulses: Normal pulses.          Radial pulses are 2+ on the right side and 2+ on the left side.       Dorsalis pedis pulses are 2+ on the right side and 2+ on the left side.     Heart sounds: Normal heart sounds. No murmur. No friction rub. No gallop.   Pulmonary:     Effort: Pulmonary  effort is normal. No respiratory distress.     Breath sounds: Normal breath sounds. No stridor. No wheezing, rhonchi or rales.     Comments: Respirations equal and unlabored, patient able to speak in full sentences, lungs clear to auscultation bilaterally Abdominal:     General: Abdomen is flat. Bowel sounds are normal. There is no distension.     Palpations: Abdomen is soft. There is no mass.     Tenderness: There is no abdominal tenderness. There is no guarding.     Comments: Abdomen soft, nondistended, nontender to palpation in all quadrants without guarding or peritoneal signs  Musculoskeletal:     Right lower leg: No edema.  Left lower leg: No edema.  Skin:    General: Skin is warm and dry.     Capillary Refill: Capillary refill takes less than 2 seconds.     Findings: No rash.  Neurological:     Comments: Neurologic exam difficult in the setting of altered mental status, patient unable to tell me her name, where she is or the name of her daughter who is at the bedside.  She intermittently moves all extremities purposefully, but is unable or unwilling to hold up both of her arms or legs.  She is able to move her feet without difficulty.  Opens eyes spontaneously to voice and is able to speak clearly and responds to some questions. GCS: 12      ED Treatments / Results  Labs (all labs ordered are listed, but only abnormal results are displayed) Labs Reviewed  CBC WITH DIFFERENTIAL/PLATELET - Abnormal; Notable for the following components:      Result Value   Neutro Abs 8.5 (*)    All other components within normal limits  URINALYSIS, ROUTINE W REFLEX MICROSCOPIC - Abnormal; Notable for the following components:   Glucose, UA >=500 (*)    Hgb urine dipstick TRACE (*)    Ketones, ur 40 (*)    Protein, ur 100 (*)    All other components within normal limits  COMPREHENSIVE METABOLIC PANEL - Abnormal; Notable for the following components:   CO2 21 (*)    Glucose, Bld 319 (*)      Total Bilirubin 1.7 (*)    All other components within normal limits  APTT - Abnormal; Notable for the following components:   aPTT 21 (*)    All other components within normal limits  URINALYSIS, MICROSCOPIC (REFLEX) - Abnormal; Notable for the following components:   Bacteria, UA FEW (*)    All other components within normal limits  I-STAT CHEM 8, ED - Abnormal; Notable for the following components:   Creatinine, Ser 0.30 (*)    Glucose, Bld 323 (*)    Calcium, Ion 1.09 (*)    All other components within normal limits  I-STAT CG4 LACTIC ACID, ED - Abnormal; Notable for the following components:   Lactic Acid, Venous 2.82 (*)    All other components within normal limits  I-STAT VENOUS BLOOD GAS, ED - Abnormal; Notable for the following components:   pCO2, Ven 42.3 (*)    pO2, Ven 48.0 (*)    All other components within normal limits  CBG MONITORING, ED - Abnormal; Notable for the following components:   Glucose-Capillary 309 (*)    All other components within normal limits  RAPID URINE DRUG SCREEN, HOSP PERFORMED  ETHANOL  LIPASE, BLOOD  PROTIME-INR  SODIUM  SODIUM  SODIUM  HIV ANTIBODY (ROUTINE TESTING W REFLEX)  I-STAT BETA HCG BLOOD, ED ([facility], [facility], [facility] ONLY)  I-STAT TROPONIN, ED  I-STAT CG4 LACTIC ACID, ED    EKG EKG Interpretation  Date/Time:  Monday January 31 2018 10:25:44 EST Ventricular Rate:  93 PR Interval:    QRS Duration: 155 QT Interval:  418 QTC Calculation: 520 R Axis:   16 Text Interpretation:  Sinus rhythm Left bundle branch block Confirmed by Sherwood Gambler [phone]) on 01/31/2018 10:30:24 AM   Radiology Ct Angio Head W Or Wo Contrast  Result Date: 01/31/2018 CLINICAL DATA:  Unsteady gait. Vomiting. Blurred vision. Abnormal cerebellum by CT. EXAM: CT ANGIOGRAPHY HEAD AND NECK TECHNIQUE: Multidetector CT imaging of the head and neck was performed using the  standard protocol during bolus administration of intravenous contrast. Multiplanar CT image  reconstructions and MIPs were obtained to evaluate the vascular anatomy. Carotid stenosis measurements (when applicable) are obtained utilizing NASCET criteria, using the distal internal carotid diameter as the denominator. CONTRAST:  173m ISOVUE-370 IOPAMIDOL (ISOVUE-370) INJECTION 76% COMPARISON:  Head CT same day FINDINGS: CTA NECK FINDINGS Aortic arch: Aortic arch is normal except for minimal atherosclerotic change. Right carotid system: Common carotid artery widely patent to the bifurcation. Mild atherosclerotic plaque at the carotid bifurcation but no stenosis or irregularity. Cervical ICA widely patent. Left carotid system: Common carotid artery widely patent to the bifurcation. Carotid bifurcation is normal without soft or calcified plaque. Cervical ICA is normal. Vertebral arteries: Both vertebral artery origins are widely patent. No subclavian stenosis proximal to the origins. Both vertebral artery origins appear widely patent through the cervical region to the foramen magnum. Skeleton: Normal Other neck: No mass or lymphadenopathy. Upper chest: Negative Review of the MIP images confirms the above findings CTA HEAD FINDINGS Anterior circulation: Both internal carotid arteries are widely patent through the skull base and siphon regions. The anterior and middle cerebral vessels appear normal without stenosis, aneurysm or vascular malformation. No missing branch vessels are identified. Posterior circulation: Both vertebral arteries are patent through the foramen magnum to the basilar. No vertebral stenosis or irregularity. I think the posterior inferior cerebellar arteries show flow on each side presently. Superior cerebellar and posterior cerebral arteries show flow. Venous sinuses: Patent and normal. Anatomic variants: None significant. Delayed phase: No abnormal enhancement. 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 Flow is present in both posterior inferior cerebellar arteries presently. No basilar artery disease or other posterior circulation branch vessel abnormality. Electronically Signed   By: MNelson ChimesM.D.   On: 01/31/2018 13:10   Ct Head Wo Contrast  Result Date: 01/31/2018 CLINICAL DATA:  Altered level of consciousness. Elevated glucose and decreased mental status. EXAM: CT HEAD WITHOUT CONTRAST TECHNIQUE: Contiguous axial images were obtained from the base of the skull through the vertex without intravenous contrast. COMPARISON:  None. FINDINGS: Brain: There is a large area of low-attenuation involving the right cerebral hemisphere which measures 3.8 x 4.1 cm, image 5/3. There is mass effect with shift of the midline to the left by approximately 5 mm, image 5/3. The cerebral hemispheres appear normal. The ventricular volumes are unremarkable. There is no evidence for acute intracranial hemorrhage. Vascular: No hyperdense vessel or unexpected calcification. Skull: Normal. Negative for fracture or focal lesion. Sinuses/Orbits: Paranasal sinuses and mastoid air cells are clear. Other: None. IMPRESSION: 1. Large area of low density within the right cerebral hemisphere is identified compatible with infarct. This is likely subacute in nature and involves much of the entire right PICA territory. Further investigation with CTA of the head and neck and brain MRI is advised. Electronically Signed   By: TKerby MoorsM.D.   On: 01/31/2018 11:19   Ct Angio Neck W And/or Wo Contrast  Result Date: 01/31/2018 CLINICAL DATA:  Unsteady gait. Vomiting. Blurred vision. Abnormal cerebellum by CT. EXAM: CT ANGIOGRAPHY HEAD AND NECK TECHNIQUE: Multidetector CT imaging of the head and neck was performed using the standard protocol during bolus administration of intravenous contrast. Multiplanar CT image reconstructions and MIPs were obtained to evaluate the  vascular anatomy. Carotid stenosis measurements (when applicable) are obtained utilizing NASCET criteria,  using the distal internal carotid diameter as the denominator. CONTRAST:  123m ISOVUE-370 IOPAMIDOL (ISOVUE-370) INJECTION 76% COMPARISON:  Head CT same day FINDINGS: CTA NECK FINDINGS Aortic arch: Aortic arch is normal except for minimal atherosclerotic change. Right carotid system: Common carotid artery widely patent to the bifurcation. Mild atherosclerotic plaque at the carotid bifurcation but no stenosis or irregularity. Cervical ICA widely patent. Left carotid system: Common carotid artery widely patent to the bifurcation. Carotid bifurcation is normal without soft or calcified plaque. Cervical ICA is normal. Vertebral arteries: Both vertebral artery origins are widely patent. No subclavian stenosis proximal to the origins. Both vertebral artery origins appear widely patent through the cervical region to the foramen magnum. Skeleton: Normal Other neck: No mass or lymphadenopathy. Upper chest: Negative Review of the MIP images confirms the above findings CTA HEAD FINDINGS Anterior circulation: Both internal carotid arteries are widely patent through the skull base and siphon regions. The anterior and middle cerebral vessels appear normal without stenosis, aneurysm or vascular malformation. No missing branch vessels are identified. Posterior circulation: Both vertebral arteries are patent through the foramen magnum to the basilar. No vertebral stenosis or irregularity. I think the posterior inferior cerebellar arteries show flow on each side presently. Superior cerebellar and posterior cerebral arteries show flow. Venous sinuses: Patent and normal. Anatomic variants: None significant. Delayed phase: No abnormal enhancement. 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 Flow is present in both posterior inferior cerebellar arteries presently. No basilar artery disease or other posterior circulation branch vessel abnormality. Electronically Signed   By: MNelson ChimesM.D.   On: 01/31/2018 13:10   Dg Chest Portable 1 View  Result Date: 01/31/2018 CLINICAL DATA:  Altered mental status EXAM: PORTABLE CHEST 1 VIEW COMPARISON:  06/03/2016 FINDINGS: Heart is borderline in size. Mild vascular congestion. Bibasilar atelectasis. No effusions or overt edema. No acute bony abnormality. IMPRESSION: Borderline heart size with vascular congestion and bibasilar atelectasis. Electronically Signed   By: KRolm BaptiseM.D.   On: 01/31/2018 11:20    Procedures .Critical Care Performed by: FJacqlyn Larsen PA-C Authorized by: FJacqlyn Larsen PA-C   Critical care provider statement:    Critical care time (minutes):  45   Critical care time was exclusive of:  Separately billable procedures and treating other patients   Critical care was necessary to treat or prevent imminent or life-threatening deterioration of the following conditions:  CNS failure or compromise   Critical care was time spent personally by me on the following activities:  Discussions with consultants, evaluation of patient's response to treatment, examination of patient, ordering and performing treatments and interventions, ordering and review of laboratory studies, ordering and review of radiographic studies, pulse oximetry, re-evaluation of patient's condition, obtaining history from patient or surrogate and review of old charts   (including critical care time)  Medications Ordered in ED Medications  iopamidol (ISOVUE-370) 76 % injection (has no administration in time range)  sodium chloride 0.9 % bolus 2,000 mL (0 mLs Intravenous Stopped 01/31/18 1151)  fentaNYL (SUBLIMAZE) injection 50 mcg (50 mcg Intravenous Given 01/31/18 1245)  iopamidol (ISOVUE-370) 76 % injection 100 mL (100 mLs Intravenous  Contrast Given 01/31/18 1242)     Initial Impression / Assessment and Plan / ED Course  I have reviewed the triage vital signs and the nursing notes.  Pertinent labs & imaging results that  were available during my care of the patient were reviewed by me and considered in my medical decision making (see chart for details).  Patient presents acutely altered, complaining of headache and hyperglycemic with CBG of 350 with EMS.  Last known well time 7 PM last night.  Patient is not oriented to person place or time.  GCS of 12.  Diaphoretic and appears acutely ill.  Hypertensive but vitals otherwise normal, no fever.  Daughter denies any complaints yesterday, no fevers or recent illnesses.  Reports she takes her insulin regularly.  Presentation concerning for DKA, but given patient is complaining of headache and CBG is not extremely elevated, would also consider acute intracranial hemorrhage or stroke although patient is not within code stroke window.  Neuro exam is difficult to due to altered mental status and patient cooperation, but she appears to be moving all extremities purposefully.  Pupils are equal and reactive, and speech is clear.  Will get CT head, portable chest, EKG, CBG, basic labs, urinalysis, UDS, ethanol level, lactic acid, troponin, and INR to assess for potential etiology of altered mental status.  Patient appears ill and diaphoretic will start 2 L fluid bolus.  Initial lactic is mildly elevated at 2.28.  Troponin is negative and EKG without acute ischemic changes.  I-STAT Chem-8 shows creatinine of 0.30, glucose of 323, no other acute electrolyte derangements.  Labs do not suggest DKA, no anion gap and normal CO2.  Chest x-ray shows borderline cardiomegaly and some mild pulmonary vascular congestion but no other acute findings, no evidence of pneumonia.  11:15 AM contacted by radiology regarding head CT results which shows a large right cerebellar infarct during 3.8 x 4.1 cm, encompassing  the entire PICA territory.  Thought to be likely subacute, radiologist estimates approximately 48 hours.  Etiology recommends further evaluation with CTA of the head and neck and MRI, CTA is ordered and will consult neurology.  Case discussed with Dr. Lorraine Lax neurology who will see and evaluate the patient.  After evaluating the patient Dr. Lorraine Lax, agrees with plan for CTA head and neck, as well as MRI.  He expresses concern with the level of edema associated with stroke already, and that patient may require decompressive craniotomy with neurosurgery, consult placed for Dr. Vertell Limber with neurosurgery.  Neurology plans to admit patient to neuro ICU for further evaluation.  Dr. Vertell Limber and Dr. Lorraine Lax have discussed case and will proceed with further imaging before planning for any surgery.  Patient's mental status has improved after fluids.  She remains somewhat confused but is more alert and responsive, GCS now 15.  Patient admitted to neuro ICU for continued management of cerebellar stroke.  Final Clinical Impressions(s) / ED Diagnoses   Final diagnoses:  Cerebellar stroke ([place])  Hyperglycemia  Altered mental status, unspecified altered mental status type    ED Discharge Orders    None       Jacqlyn Larsen, [place] 01/31/18 1752    Sherwood Gambler, MD 02/03/18 [phone]

## 2018-01-31 NOTE — Progress Notes (Signed)
PT Cancellation Note  Patient Details Name: April Fry MRN: [id number] DOB: [birth year]   Cancelled Treatment:    Reason Eval/Treat Not Completed: Medical issues which prohibited therapy.  Neurosurgery consult pending.  RN also recommended waiting until AM after her repeat head CT.  PT will check back tomorrow.  Thanks,  Rollene Rotundaebecca B. Catrinia Racicot, PT, DPT  Acute Rehabilitation [phone] pager [phone] office     Rollene Rotundaebecca B Trystin Hargrove 01/31/2018, 5:47 PM

## 2018-01-31 NOTE — ED Notes (Signed)
Called Dr. Venetia MaxonStern to [facility], [phone]; Lenord CarboLlilbert

## 2018-01-31 NOTE — ED Notes (Signed)
This RN transported patient to CT scan

## 2018-01-31 NOTE — ED Notes (Signed)
Patient transported to MRI 

## 2018-01-31 NOTE — ED Triage Notes (Signed)
Pt BIB [facility] from home with hyperglycemia since last night. CBG 350 for EMS. Hx Type 2 diabetes.

## 2018-01-31 NOTE — Progress Notes (Signed)
Followed up with [facility] regarding multiple CT scan orders.  [facility] called [facility] for clarification. [facility] wishes to have a head CT at 2100 and again at 12/24 0800.   [facility] also informed of pt started on Phase 2 Glycemic protocol (insulin gtt).  [facility]CT[facility].

## 2018-02-01 ENCOUNTER — Inpatient Hospital Stay (HOSPITAL_COMMUNITY): Payer: Self-pay

## 2018-02-01 DIAGNOSIS — I5030 Unspecified diastolic (congestive) heart failure: Secondary | ICD-10-CM

## 2018-02-01 DIAGNOSIS — I639 Cerebral infarction, unspecified: Secondary | ICD-10-CM

## 2018-02-01 LAB — GLUCOSE, CAPILLARY
GLUCOSE-CAPILLARY: 126 mg/dL — AB (ref 70–99)
Glucose-Capillary: 115 mg/dL — ABNORMAL HIGH (ref 70–99)
Glucose-Capillary: 121 mg/dL — ABNORMAL HIGH (ref 70–99)
Glucose-Capillary: 130 mg/dL — ABNORMAL HIGH (ref 70–99)
Glucose-Capillary: 137 mg/dL — ABNORMAL HIGH (ref 70–99)
Glucose-Capillary: 149 mg/dL — ABNORMAL HIGH (ref 70–99)
Glucose-Capillary: 152 mg/dL — ABNORMAL HIGH (ref 70–99)
Glucose-Capillary: 157 mg/dL — ABNORMAL HIGH (ref 70–99)
Glucose-Capillary: 165 mg/dL — ABNORMAL HIGH (ref 70–99)

## 2018-02-01 LAB — LIPID PANEL
Cholesterol: 158 mg/dL (ref 0–200)
HDL: 49 mg/dL (ref >40)
LDL Cholesterol: 95 mg/dL (ref 0–99)
Total CHOL/HDL Ratio: 3.2 RATIO
Triglycerides: 69 mg/dL (ref <150)
VLDL: 14 mg/dL (ref 0–40)

## 2018-02-01 LAB — SODIUM
Sodium: 143 mmol/L (ref 135–145)
Sodium: 147 mmol/L — ABNORMAL HIGH (ref 135–145)
Sodium: 145 mmol/L (ref 135–145)
Sodium: 140 mmol/L (ref 135–145)

## 2018-02-01 LAB — ECHOCARDIOGRAM COMPLETE
Height: 62 in
Weight: 2934.76 oz

## 2018-02-01 LAB — HEMOGLOBIN A1C
Hgb A1c MFr Bld: 8.9 % — ABNORMAL HIGH (ref 4.8–5.6)
Mean Plasma Glucose: 208.73 mg/dL

## 2018-02-01 LAB — HIV ANTIBODY (ROUTINE TESTING W REFLEX): HIV Screen 4th Generation wRfx: NONREACTIVE

## 2018-02-01 MED ORDER — PERFLUTREN LIPID MICROSPHERE
1.0000 mL | INTRAVENOUS | Status: AC | PRN
  Administered 2018-02-01: 2 mL via INTRAVENOUS
  Filled 2018-02-01: qty 10

## 2018-02-01 MED ORDER — ONDANSETRON HCL 4 MG/2ML IJ SOLN
4.0000 mg | Freq: Four times a day (QID) | INTRAMUSCULAR | Status: DC | PRN
  Administered 2018-02-01 – 2018-02-02 (×3): 4 mg via INTRAVENOUS
  Filled 2018-02-01 (×3): qty 2

## 2018-02-01 MED ORDER — INSULIN DETEMIR 100 UNIT/ML ~~LOC~~ SOLN
5.0000 [IU] | Freq: Two times a day (BID) | SUBCUTANEOUS | Status: DC
  Administered 2018-02-01 (×2): 5 [IU] via SUBCUTANEOUS
  Filled 2018-02-01 (×3): qty 0.05

## 2018-02-01 MED ORDER — LOSARTAN POTASSIUM 50 MG PO TABS
100.0000 mg | ORAL_TABLET | Freq: Every day | ORAL | Status: DC
  Administered 2018-02-01 – 2018-02-04 (×4): 100 mg via ORAL
  Filled 2018-02-01 (×5): qty 2

## 2018-02-01 MED ORDER — SODIUM CHLORIDE 3 % IV SOLN
INTRAVENOUS | Status: DC
  Administered 2018-02-01 (×3): 75 mL/h via INTRAVENOUS
  Filled 2018-02-01 (×4): qty 500

## 2018-02-01 MED ORDER — ROSUVASTATIN CALCIUM 20 MG PO TABS
20.0000 mg | ORAL_TABLET | Freq: Every day | ORAL | Status: DC
  Administered 2018-02-01 – 2018-02-07 (×7): 20 mg via ORAL
  Filled 2018-02-01 (×7): qty 1

## 2018-02-01 MED ORDER — PERFLUTREN LIPID MICROSPHERE
INTRAVENOUS | Status: AC
  Filled 2018-02-01: qty 10

## 2018-02-01 MED ORDER — TRAMADOL HCL 50 MG PO TABS
100.0000 mg | ORAL_TABLET | Freq: Four times a day (QID) | ORAL | Status: DC | PRN
  Administered 2018-02-01 – 2018-02-04 (×8): 100 mg via ORAL
  Filled 2018-02-01 (×9): qty 2

## 2018-02-01 MED ORDER — AMLODIPINE BESYLATE 10 MG PO TABS
10.0000 mg | ORAL_TABLET | Freq: Every day | ORAL | Status: DC
  Administered 2018-02-01 – 2018-02-07 (×8): 10 mg via ORAL
  Filled 2018-02-01 (×7): qty 1

## 2018-02-01 MED ORDER — LIDOCAINE HCL (PF) 1 % IJ SOLN
INTRAMUSCULAR | Status: AC
  Filled 2018-02-01: qty 5

## 2018-02-01 MED ORDER — INSULIN ASPART 100 UNIT/ML ~~LOC~~ SOLN
2.0000 [IU] | SUBCUTANEOUS | Status: DC
  Administered 2018-02-01: 2 [IU] via SUBCUTANEOUS
  Administered 2018-02-01: 4 [IU] via SUBCUTANEOUS
  Administered 2018-02-01 (×2): 2 [IU] via SUBCUTANEOUS
  Administered 2018-02-01 (×2): 4 [IU] via SUBCUTANEOUS

## 2018-02-01 MED ORDER — ASPIRIN EC 81 MG PO TBEC
81.0000 mg | DELAYED_RELEASE_TABLET | Freq: Every day | ORAL | Status: DC
  Administered 2018-02-01 – 2018-02-03 (×3): 81 mg via ORAL
  Filled 2018-02-01 (×3): qty 1

## 2018-02-01 NOTE — Procedures (Signed)
Central Venous Catheter Insertion Procedure Note April RingerMaria Fry [id number] May 31, 1960  Procedure: Insertion of Central Venous Catheter Indications: Drug and/or fluid administration  Procedure Details Consent: Risks of procedure as well as the alternatives and risks of each were explained to the (patient/caregiver).  Consent for procedure obtained. Time Out: Verified patient identification, verified procedure, site/side was marked, verified correct patient position, special equipment/implants available, medications/allergies/relevent history reviewed, required imaging and test results available.  Performed  Maximum sterile technique was used including antiseptics, cap, gloves, gown, hand hygiene, mask and sheet. Skin prep: Chlorhexidine; local anesthetic administered A antimicrobial bonded/coated single lumen catheter was placed in the left internal jugular vein using the Seldinger technique. Catheter placed to 20 cm. Blood aspirated via all 3 ports and then flushed x 3. Line sutured x 2 and dressing applied.  Ultrasound guidance used.Yes.    Evaluation Blood flow good Complications: No apparent complications Patient did tolerate procedure well. Chest X-ray ordered to verify placement.  CXR: pending.     Joneen RoachPaul Dorien Bessent, AGACNP-BC [facility] Pager [phone] or [phone]  02/01/2018 5:58 PM

## 2018-02-01 NOTE — Progress Notes (Signed)
CXR following initial CVC placement shows tip overlying the proximal to mid right atrium.  Therefore, Left IJ pulled out to 18/19 cm. Line resutured.  Biopatch and sterile dressing applied.    Repeat CXR satisfactory and may use.    Posey BoyerBrooke Simpson, AGACNP-BC [facility] Pgr: [phone] or if no answer [phone] 02/01/2018, 9:20 PM

## 2018-02-01 NOTE — Evaluation (Signed)
Physical Therapy Evaluation Patient Details Name: April Fry MRN: [id number] DOB: Apr 04, 1960 Today's Date: 02/01/2018   History of Present Illness  April Fry is a 57 y.o. female with history of hypertension, diabetes. Came to ED with n/v, room spinning, and difficulty walking. 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  Clinical Impression  Pt admitted with above. Pt was indep PTA but now with severe dizzines/room spinning with mobility requiring mod/maxA for all mobility. Family used as Engineer, technical salesinterpretor. Pt c/o n/v and dizziness and emotional regarding deficits. Pt to benefit from from aggressive rehab program to achieve max functional return. Acute PT to cont to follow.    Follow Up Recommendations CIR    Equipment Recommendations  None recommended by PT(TBD)    Recommendations for Other Services Rehab consult     Precautions / Restrictions Precautions Precautions: Fall Precaution Comments: very dizzy, high fall risk Restrictions Weight Bearing Restrictions: No      Mobility  Bed Mobility Overal bed mobility: Needs Assistance Bed Mobility: Rolling;Sidelying to Sit Rolling: Min guard Sidelying to sit: Min assist;Mod assist       General bed mobility comments: pt pushed self up however due to severe dizziness/room spinning pt unable to stop at midline and fell onto L side on bed requiring modA to maintain upright/midline position  Transfers Overall transfer level: Needs assistance Equipment used: 2 person hand held assist Transfers: Sit to/from BJ'sStand;Stand Pivot Transfers Sit to Stand: Min assist;Mod assist;+2 physical assistance Stand pivot transfers: Min assist;Mod assist;+2 physical assistance       General transfer comment: PT and OT with close body contact with patient to minimize posterior lean due to severe dizziness with position change  Ambulation/Gait             General Gait Details: unable this date due to  dizzines  Stairs            Wheelchair Mobility    Modified Rankin (Stroke Patients Only)       Balance Overall balance assessment: Needs assistance Sitting-balance support: Feet supported;Bilateral upper extremity supported Sitting balance-Leahy Scale: Zero Sitting balance - Comments: due to severe dizziness pt requires mod/maxA to maintain EOB balance   Standing balance support: Bilateral upper extremity supported Standing balance-Leahy Scale: Poor Standing balance comment: due to severe dizziness pt requires external support to maintain balance, pt with lateral sway L/R                             Pertinent Vitals/Pain Pain Assessment: Faces Faces Pain Scale: Hurts little more Pain Location: head Pain Descriptors / Indicators: Aching Pain Intervention(s): Limited activity within patient's tolerance    Home Living Family/patient expects to be discharged to:: Private residence Living Arrangements: Spouse/significant other Available Help at Discharge: Family Type of Home: Apartment Home Access: Level entry     Home Layout: Two level;Bed/bath upstairs Home Equipment: None Additional Comments: daughter             lives 45 min away    Prior Function Level of Independence: Independent         Comments: does not work;   likes to      [employer]inesHand Dominance   Dominant Hand: Right    Extremity/Trunk Assessment   Upper Extremity Assessment Upper Extremity Assessment: Defer to OT evaluation    Lower Extremity Assessment Lower Extremity Assessment: Overall WFL for tasks assessed    Cervical / Trunk Assessment Cervical /  Trunk Assessment: Normal  Communication   Communication: Prefers language other than English(Spainish)  Cognition Arousal/Alertness: Lethargic Behavior During Therapy: Flat affect(then became upset due to impairments) Overall Cognitive Status: Impaired/Different from baseline Area of Impairment: Following  commands;Safety/judgement;Awareness;Problem solving                       Following Commands: Follows one step commands with increased time Safety/Judgement: Decreased awareness of safety;Decreased awareness of deficits(due to quick onset of dizziness) Awareness: Emergent(states when she has to pee) Problem Solving: Difficulty sequencing;Requires verbal cues;Requires tactile cues General Comments: pt maintained eyes closed for majority of time due to "room spinning"      General Comments General comments (skin integrity, edema, etc.): BP increased to 226/92, RN made aware and provided medicine    Exercises     Assessment/Plan    PT Assessment Patient needs continued PT services  PT Problem List Decreased strength;Decreased range of motion;Decreased activity tolerance;Decreased balance;Decreased mobility;Decreased coordination;Decreased cognition;Decreased knowledge of use of DME;Decreased safety awareness;Pain       PT Treatment Interventions DME instruction;Gait training;Stair training;Therapeutic activities;Functional mobility training;Therapeutic exercise;Balance training;Neuromuscular re-education;Cognitive remediation    PT Goals (Current goals can be found in the Care Plan section)  Acute Rehab PT Goals Patient Stated Goal: didn't state PT Goal Formulation: With patient Time For Goal Achievement: 02/15/18 Potential to Achieve Goals: Good    Frequency Min 4X/week   Barriers to discharge        Co-evaluation PT/OT/SLP Co-Evaluation/Treatment: Yes Reason for Co-Treatment: Complexity of the patient's impairments (multi-system involvement) PT goals addressed during session: Mobility/safety with mobility         AM-PAC PT "6 Clicks" Mobility  Outcome Measure Help needed turning from your back to your side while in a flat bed without using bedrails?: A Little Help needed moving from lying on your back to sitting on the side of a flat bed without using  bedrails?: A Little Help needed moving to and from a bed to a chair (including a wheelchair)?: A Lot Help needed standing up from a chair using your arms (e.g., wheelchair or bedside chair)?: A Lot Help needed to walk in hospital room?: A Lot Help needed climbing 3-5 steps with a railing? : Total 6 Click Score: 13    End of Session Equipment Utilized During Treatment: Gait belt Activity Tolerance: Patient limited by fatigue;Patient limited by pain Patient left: in chair;with call bell/phone within reach;with chair alarm set;with family/visitor present;with nursing/sitter in room Nurse Communication: Mobility status PT Visit Diagnosis: Unsteadiness on feet (R26.81);Muscle weakness (generalized) (M62.81);Difficulty in walking, not elsewhere classified (R26.2)    Time: 1410-1445 PT Time Calculation (min) (ACUTE ONLY): 35 min   Charges:   PT Evaluation $PT Eval High Complexity: 1 High          Lewis ShockAshly Kameelah Minish, PT, DPT Acute Rehabilitation Services Pager #: [phone] Office #: [phone]   Iona Hansenshly M Sharah Finnell 02/01/2018, 2:58 PM

## 2018-02-01 NOTE — Progress Notes (Signed)
STROKE TEAM PROGRESS NOTE   INTERVAL HISTORY Her daughter is at bedside, patient is spanish speaking. Patient has headache and nausea. Zofran ordered. Discussed cerebellar stroke and swelling and possible craniotomy.Patient clinically looks wells, alert and oriented.   Vitals:   02/01/18 0700 02/01/18 0800 02/01/18 0900 02/01/18 1000  BP: (!) 161/51 (!) 179/76 (!) 182/75 (!) 184/71  Pulse: 84 88 85 86  Resp: 18 18 19  (!) 25  Temp:      TempSrc:      SpO2: 95% 94% 96% 96%  Weight:      Height:        CBC:  Recent Labs  Lab 01/31/18 1035 01/31/18 1045  WBC 10.0  --   NEUTROABS 8.5*  --   HGB 13.8 15.0  HCT 42.2 44.0  MCV 88.5  --   PLT 237  --     Basic Metabolic Panel:  Recent Labs  Lab 01/31/18 1035 01/31/18 1045  02/01/18 0011 02/01/18 0556  NA 135 137   < > 143 147*  K 4.1 3.9  --   --   --   CL 101 103  --   --   --   CO2 21*  --   --   --   --   GLUCOSE 319* 323*  --   --   --   BUN 8 8  --   --   --   CREATININE 0.58 0.30*  --   --   --   CALCIUM 8.9  --   --   --   --    < > = values in this interval not displayed.   Lipid Panel:     Component Value Date/Time   CHOL 158 02/01/2018 0011   CHOL 199 11/25/2017 1040   TRIG 69 02/01/2018 0011   HDL 49 02/01/2018 0011   HDL 48 11/25/2017 1040   CHOLHDL 3.2 02/01/2018 0011   VLDL 14 02/01/2018 0011   LDLCALC 95 02/01/2018 0011   LDLCALC 128 (H) 11/25/2017 1040   HgbA1c:  Lab Results  Component Value Date   HGBA1C 8.9 (H) 02/01/2018   Urine Drug Screen:     Component Value Date/Time   LABOPIA NONE DETECTED 01/31/2018 1042   COCAINSCRNUR NONE DETECTED 01/31/2018 1042   LABBENZ NONE DETECTED 01/31/2018 1042   AMPHETMU NONE DETECTED 01/31/2018 1042   THCU NONE DETECTED 01/31/2018 1042   LABBARB NONE DETECTED 01/31/2018 1042    Alcohol Level     Component Value Date/Time   ETH <10 01/31/2018 1034    IMAGING Ct Angio Head W Or Wo Contrast  Result Date: 01/31/2018 CLINICAL DATA:  Unsteady  gait. Vomiting. Blurred vision. Abnormal cerebellum by CT. EXAM: CT ANGIOGRAPHY HEAD AND NECK TECHNIQUE: Multidetector CT imaging of the head and neck was performed using the standard protocol during bolus administration of intravenous contrast. Multiplanar CT image reconstructions and MIPs were obtained to evaluate the vascular anatomy. Carotid stenosis measurements (when applicable) are obtained utilizing NASCET criteria, using the distal internal carotid diameter as the denominator. CONTRAST:  100mL ISOVUE-370 IOPAMIDOL (ISOVUE-370) INJECTION 76% COMPARISON:  Head CT same day FINDINGS: CTA NECK FINDINGS Aortic arch: Aortic arch is normal except for minimal atherosclerotic change. Right carotid system: Common carotid artery widely patent to the bifurcation. Mild atherosclerotic plaque at the carotid bifurcation but no stenosis or irregularity. Cervical ICA widely patent. Left carotid system: Common carotid artery widely patent to the bifurcation. Carotid bifurcation is normal without soft or  calcified plaque. Cervical ICA is normal. Vertebral arteries: Both vertebral artery origins are widely patent. No subclavian stenosis proximal to the origins. Both vertebral artery origins appear widely patent through the cervical region to the foramen magnum. Skeleton: Normal Other neck: No mass or lymphadenopathy. Upper chest: Negative Review of the MIP images confirms the above findings CTA HEAD FINDINGS Anterior circulation: Both internal carotid arteries are widely patent through the skull base and siphon regions. The anterior and middle cerebral vessels appear normal without stenosis, aneurysm or vascular malformation. No missing branch vessels are identified. Posterior circulation: Both vertebral arteries are patent through the foramen magnum to the basilar. No vertebral stenosis or irregularity. I think the posterior inferior cerebellar arteries show flow on each side presently. Superior cerebellar and posterior  cerebral arteries show flow. Venous sinuses: Patent and normal. Anatomic variants: None significant. Delayed phase: No abnormal enhancement. 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 Flow is present in both posterior inferior cerebellar arteries presently. No basilar artery disease or other posterior circulation branch vessel abnormality. Electronically Signed   By: Paulina Fusi M.D.   On: 01/31/2018 13:10   Ct Head Wo Contrast  Result Date: 02/01/2018 CLINICAL DATA:  Followup cerebellar infarction. EXAM: CT HEAD WITHOUT CONTRAST TECHNIQUE: Contiguous axial images were obtained from the base of the skull through the vertex without intravenous contrast. COMPARISON:  Multiple studies 01/31/2018 FINDINGS: Brain: Acute infarction within the inferior cerebellum on the right shows continued increase in low-density and swelling. No evidence of hemorrhage. Swallow and cerebellar tonsil protrudes through the foramen magnum. Mass-effect upon the fourth ventricle but without evidence of change in size of the lateral or third ventricles. Cerebral hemispheres continue to show a normal appearance. Vascular: No abnormal vascular finding by CT. Skull: Negative Sinuses/Orbits: Clear/normal Other: None IMPRESSION: Slight progression of low-density and swelling within the inferior cerebellum on the right consistent with posterior inferior cerebellar artery territory infarction. Right cerebellar tonsil protrudes through the foramen magnum. Mass-effect upon the fourth ventricle but no sign obstruction or change in ventricular size. No sign of bleeding. Electronically Signed   By: Paulina Fusi M.D.   On: 02/01/2018 10:04   Ct Head Wo Contrast  Result Date: 01/31/2018 CLINICAL DATA:  Follow-up infarct. History of head and neck cancer, hypertension and diabetes. EXAM: CT  HEAD WITHOUT CONTRAST TECHNIQUE: Contiguous axial images were obtained from the base of the skull through the vertex without intravenous contrast. COMPARISON:  None. FINDINGS: BRAIN: Wedge-like RIGHT inferior cerebellar hypodensity with regional mass effect, narrowed though patent fourth ventricle. No intraparenchymal hemorrhage, nor midline shift. The ventricles and sulci are normal. No acute large vascular territory infarcts. No abnormal extra-axial fluid collections. Basal cisterns are patent. VASCULAR: Unremarkable. SKULL/SOFT TISSUES: No skull fracture. No significant soft tissue swelling. ORBITS/SINUSES: The included ocular globes and orbital contents are normal.Trace paranasal sinus mucosal thickening. Mastoid air cells are well aerated. OTHER: None. IMPRESSION: Evolving acute RIGHT PICA territory infarct without hemorrhagic conversion. Regional mass effect without obstructive hydrocephalus. Electronically Signed   By: Awilda Metro M.D.   On: 01/31/2018 22:07   Ct Head Wo Contrast  Result Date: 01/31/2018 CLINICAL DATA:  Altered level of consciousness. Elevated glucose and decreased mental status. EXAM: CT HEAD WITHOUT CONTRAST TECHNIQUE: Contiguous axial images were obtained from the base of the skull through the vertex without intravenous contrast. COMPARISON:  None. FINDINGS:  Brain: There is a large area of low-attenuation involving the right cerebral hemisphere which measures 3.8 x 4.1 cm, image 5/3. There is mass effect with shift of the midline to the left by approximately 5 mm, image 5/3. The cerebral hemispheres appear normal. The ventricular volumes are unremarkable. There is no evidence for acute intracranial hemorrhage. Vascular: No hyperdense vessel or unexpected calcification. Skull: Normal. Negative for fracture or focal lesion. Sinuses/Orbits: Paranasal sinuses and mastoid air cells are clear. Other: None. IMPRESSION: 1. Large area of low density within the right cerebral hemisphere  is identified compatible with infarct. This is likely subacute in nature and involves much of the entire right PICA territory. Further investigation with CTA of the head and neck and brain MRI is advised. Electronically Signed   By: Signa Kell M.D.   On: 01/31/2018 11:19   Ct Angio Neck W And/or Wo Contrast  Result Date: 01/31/2018 CLINICAL DATA:  Unsteady gait. Vomiting. Blurred vision. Abnormal cerebellum by CT. EXAM: CT ANGIOGRAPHY HEAD AND NECK TECHNIQUE: Multidetector CT imaging of the head and neck was performed using the standard protocol during bolus administration of intravenous contrast. Multiplanar CT image reconstructions and MIPs were obtained to evaluate the vascular anatomy. Carotid stenosis measurements (when applicable) are obtained utilizing NASCET criteria, using the distal internal carotid diameter as the denominator. CONTRAST:  ISOVUE-370 IOPAMIDOL (ISOVUE-370) INJECTION 76% COMPARISON:  Head CT same day FINDINGS: CTA NECK FINDINGS Aortic arch: Aortic arch is normal except for minimal atherosclerotic change. Right carotid system: Common carotid artery widely patent to the bifurcation. Mild atherosclerotic plaque at the carotid bifurcation but no stenosis or irregularity. Cervical ICA widely patent. Left carotid system: Common carotid artery widely patent to the bifurcation. Carotid bifurcation is normal without soft or calcified plaque. Cervical ICA is normal. Vertebral arteries: Both vertebral artery origins are widely patent. No subclavian stenosis proximal to the origins. Both vertebral artery origins appear widely patent through the cervical region to the foramen magnum. Skeleton: Normal Other neck: No mass or lymphadenopathy. Upper chest: Negative Review of the MIP images confirms the above findings CTA HEAD FINDINGS Anterior circulation: Both internal carotid arteries are widely patent through the skull base and siphon regions. The anterior and middle cerebral vessels appear  normal without stenosis, aneurysm or vascular malformation. No missing branch vessels are identified. Posterior circulation: Both vertebral arteries are patent through the foramen magnum to the basilar. No vertebral stenosis or irregularity. I think the posterior inferior cerebellar arteries show flow on each side presently. Superior cerebellar and posterior cerebral arteries show flow. Venous sinuses: Patent and normal. Anatomic variants: None significant. Delayed phase: No abnormal enhancement. 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 Flow is present in both posterior inferior cerebellar arteries presently. No basilar artery disease or other posterior circulation branch vessel abnormality. Electronically Signed   By: Paulina Fusi M.D.   On: 01/31/2018 13:10   Mr Brain Wo Contrast  Result Date: 01/31/2018 CLINICAL DATA:  57 year old female with unsteady gait, vomiting, evidence of right cerebellar infarct on CT today. EXAM: MRI HEAD WITHOUT CONTRAST TECHNIQUE: Multiplanar, multiecho pulse sequences of the brain and surrounding structures were obtained without intravenous contrast. COMPARISON:  CT head and CTA head and neck earlier today. FINDINGS: Brain: Confluent restricted diffusion in the right inferior cerebellum, PICA territory extending from the vermis to the posterolateral surface up to 5.5  centimeters. Estimated infarct volume is 26 mL. No involvement of the brainstem is evident. Cytotoxic edema and petechial hemorrhage (series 8, image 5). Overall mild posterior fossa mass effect including partial effacement of the 4th ventricle. However, lateral and 3rd ventricle size remains normal. No other restricted diffusion. No supratentorial mass effect. No extra-axial collection. Scattered mild to moderate for age nonspecific subcortical cerebral white  matter T2 and FLAIR hyperintensity. Negative deep gray matter nuclei. Negative pituitary. Vascular: Major intracranial vascular flow voids are preserved. Skull and upper cervical spine: Negative visible cervical spine and spinal cord. Visualized bone marrow signal is within normal limits. Sinuses/Orbits: Negative orbits. Paranasal sinuses and mastoids are stable and well pneumatized. Other: Scalp and face soft tissues are negative. IMPRESSION: 1. Moderate size acute Right PICA cerebellar infarct with cytotoxic edema and petechial hemorrhage. Mild posterior fossa mass effect including partial effacement of the 4th ventricle, but no ventriculomegaly or malignant hemorrhagic transformation. 2. No brainstem infarct or other acute intracranial abnormality. 3. Mild to moderate for age nonspecific cerebral white matter signal changes. Study discussed by telephone with Dr. Arther Dames on 01/31/2018 at 16:06 . Electronically Signed   By: Odessa Fleming M.D.   On: 01/31/2018 16:06   Dg Chest Port 1 View  Result Date: 02/01/2018 CLINICAL DATA:  Stroke. EXAM: PORTABLE CHEST 1 VIEW COMPARISON:  01/31/2018. FINDINGS: Cardiomegaly. Mild vascular congestion. No consolidation or edema. Similar appearance to priors. IMPRESSION: Cardiomegaly with mild vascular congestion. Electronically Signed   By: Elsie Stain M.D.   On: 02/01/2018 08:31   Dg Chest Portable 1 View  Result Date: 01/31/2018 CLINICAL DATA:  Altered mental status EXAM: PORTABLE CHEST 1 VIEW COMPARISON:  06/03/2016 FINDINGS: Heart is borderline in size. Mild vascular congestion. Bibasilar atelectasis. No effusions or overt edema. No acute bony abnormality. IMPRESSION: Borderline heart size with vascular congestion and bibasilar atelectasis. Electronically Signed   By: Charlett Nose M.D.   On: 01/31/2018 11:20   Vas US Carotid (at [facility])  Result Date: 02/01/2018 Carotid Arterial Duplex Study Indications: CVA. Limitations: body habitus Performing  Technologist: Levada Schilling RDMS, RVT  Examination Guidelines: A complete evaluation includes B-mode imaging, spectral Doppler, color Doppler, and power Doppler as needed of all accessible portions of each vessel. Bilateral testing is considered an integral part of a complete examination. Limited examinations for reoccurring indications may be performed as noted.  Right Carotid Findings: +----------+--------+--------+--------+--------+--------+           PSV cm/sEDV cm/sStenosisDescribeComments +----------+--------+--------+--------+--------+--------+ CCA Prox  85      12                               +----------+--------+--------+--------+--------+--------+ CCA Distal75      17                               +----------+--------+--------+--------+--------+--------+ ICA Prox  58      16                               +----------+--------+--------+--------+--------+--------+ ICA Mid   81      20                               +----------+--------+--------+--------+--------+--------+ ICA Distal87      26                               +----------+--------+--------+--------+--------+--------+  ECA       129     13                               +----------+--------+--------+--------+--------+--------+ +----------+--------+-------+--------+-------------------+           PSV cm/sEDV cmsDescribeArm Pressure (mmHG) +----------+--------+-------+--------+-------------------+ [id number]                                        +----------+--------+-------+--------+-------------------+ +---------+--------+--+--------+--+---------+ VertebralPSV cm/s49EDV cm/s12Antegrade +---------+--------+--+--------+--+---------+  Left Carotid Findings: +----------+--------+--------+--------+--------+--------+           PSV cm/sEDV cm/sStenosisDescribeComments +----------+--------+--------+--------+--------+--------+ CCA Prox  101     9                                 +----------+--------+--------+--------+--------+--------+ CCA Distal73      12                               +----------+--------+--------+--------+--------+--------+ ICA Prox  78      16                               +----------+--------+--------+--------+--------+--------+ ICA Mid   90      23                               +----------+--------+--------+--------+--------+--------+ ICA Distal93      27                               +----------+--------+--------+--------+--------+--------+ ECA       257     27      >50%                     +----------+--------+--------+--------+--------+--------+ +----------+--------+--------+--------+-------------------+ SubclavianPSV cm/sEDV cm/sDescribeArm Pressure (mmHG) +----------+--------+--------+--------+-------------------+           165                                         +----------+--------+--------+--------+-------------------+ +---------+--------+--+--------+--+---------+ VertebralPSV cm/s69EDV cm/s13Antegrade +---------+--------+--+--------+--+---------+  Summary: Right Carotid: Velocities in the right ICA are consistent with a 1-39% stenosis. Left Carotid: Velocities in the left ICA are consistent with a 1-39% stenosis.               The ECA appears <50% stenosed. Vertebrals: Bilateral vertebral arteries demonstrate antegrade flow. *See table(s) above for measurements and observations.  Electronically signed by Coral ElseVance Brabham MD on 02/01/2018 at 10:56:32 AM.    Final    Carotid Doppler   There is 1-39% bilateral ICA stenosis. Vertebral artery flow is antegrade.   2D echocardiogram - Left ventricle: The cavity size was normal. There was mild concentric hypertrophy. Systolic function was normal. The estimated ejection fraction was in the range of 55% to 60%. Wall motion was normal; there were no regional wall motion abnormalities. Doppler parameters are consistent with abnormal left ventricular relaxation  (grade 1 diastolic dysfunction). Doppler parameters are consistent with indeterminate ventricular filling pressure. - Aorta: Ascending aortic diameter:  38 mm (S). - Ascending aorta: The ascending aorta was mildly dilated. - Mitral valve: Transvalvular velocity was within the normal range. There was no evidence for stenosis. There was trivial regurgitation. - Right ventricle: The cavity size was normal. Wall thickness was normal. Systolic function was normal. - Tricuspid valve: There was trivial regurgitation. - Pulmonary arteries: Systolic pressure was within the normal range. PA peak pressure: 32 mm Hg (S).   Exam: NAD, pleasant                  Speech:    Speech is normal; fluent and spontaneous with normal comprehension.  Cognition:    The patient is oriented to person, place, and time;     recent and remote memory intact;     language fluent;    Cranial Nerves:    The pupils are equal, round, and reactive to light.Trigeminal sensation is intact and the muscles of mastication are normal. The face is symmetric. The palate elevates in the midline. Hearing intact. Voice is normal. Shoulder shrug is normal. The tongue has normal motion without fasciculations.   Coordination:  dysmetria left arm and leg  Motor Observation:    No asymmetry, no atrophy, and no involuntary movements noted. Tone:    Normal muscle tone.     Strength:    Strength is V/V in the upper and lower limbs.      Sensation: intact to LT   ASSESSMENT/PLAN Ms. Riane Rung is a 57 y.o. female with history of HTN and diabetes presenting with nausea, vomiting, dizziness and unsteady gait.   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 Dutch Quint.  While there is increasing edema, not a candidate for surgery at this time.  He will continue to  follow.  CT head 12/23 1119 large R cerebral infarct R PICA territory  CTA head & neck 12/23 1241 R inferior cerebellar infarct  MRI 12/23 1554 mod R PICA cerebellar infarct w/ cytotoxic edema and petechial hmg. Mild effacement 4th ventricle. Mild Small vessel disease.   CT head 12/23 2055 evolving R PICA infarct w/o HT. Regional mass effect w/o hydrocephalus.   CT head 12/24 1004  Slight progression of stroke and selling inferior cerebellum territory.  Right cerebellar tonsil protruding through the foramen magnum.  Mass-effect on fourth ventricle but no sign of obstruction or increase in ventricular size.  No bleeding  Repeat CT in a.m. 12/25   carotid Doppler  B ICA 1-39% stenosis, VAs antegrade   2D Echo  EF 55-60%. No source of embolus   Consider TEE and loop to further evaluate for embolic source once patient stable  Consider hypercoagulable labs for workup in young  LDL 95  HgbA1c 8.9  HIV pending   RPR pending  SCDs for VTE prophylaxis  Patient passed swallowing.  We will start clear liquid diet in the event surgical intervention is indicated.  No antithrombotic prior to admission, now on No antithrombotic asheld for possibility of neurosurgery. Will go ahead and start aspiring 81 mg daily.   Therapy recommendations:  CIR  Disposition:  pending   Cerebral edema Induced Hypernatremia  High risk for continued cerebral edema  CT now with increasing edema  Discussed with Dr. Jordan Likes who saw her this am. For now, will hold given good clinical exam  Continue 3%  Goal  Na 150-155  Na now 147  Check Na q 6h  Needs central line for 3% placed. Contacted CCM to place.  Headache  Secondary to stroke, edema  Ultram 100 q6h prn  Avoid other NSAIDs, concern for narcotics as we are watching neuro status closely  Hypertension  Home Meds:  norvasc 10, cozaar 100  BP now 180s, Stable . Permissive hypertension (OK if < 220/120) but gradually normalize in 5-7  days . BP increasing steadily throughout the day . Will resume home meds . Long-term BP goal normotensive  Hyperlipidemia  Home meds:  crestor 20  LDL 95, goal < 70  resumed home crestor in hospital  Continue statin at discharge  Diabetes type II  Home meds:  lantus 16 hs, glucophage 1000 bid  Was on insulin drip during the night  Now on SSI & levemir 5 q12  HgbA1c 8.9, goal < 7.0  Uncontrolled  Other Stroke Risk Factors  ETOH use, advised to drink no more than 1 drink(s) a day  Obesity, Body mass index is 33.55 kg/m., recommend weight loss, diet and exercise as appropriate   Hospital day # 1  This is a 57 year old female with hypertension and diabetes who presented with nausea dizziness and vomiting 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  She continues to have headaches and nausea.  Very concerning for edema over the next several days.  Discussed with Dr. Venetia Maxon and Dr. Dutch Quint at this point I would recommend occipital craniotomy Dr. Dutch Quint would like to monitor her, discussed this with Dr. Dutch Quint today that would prefer to perform an occipital craniotomy sooner rather than later before she has herniation or clinical changes.  Dr. Dutch Quint will be monitoring the case.  Personally examined patient and images, and have participated in and made any corrections needed to history, physical, neuro exam,assessment and plan as stated above.  I have personally obtained the history, evaluated lab date, reviewed imaging studies and agree with radiology interpretations.   This patient is critically ill and at significant risk of neurological worsening, death and care requires constant monitoring of vital signs, hemodynamics,respiratory and cardiac monitoring,review of multiple databases, neurological assessment, discussion with family, other specialists and medical decision making of high complexity.I  I spent 30 minutes of neurocritical care time in the care of this  patient.  Naomie Dean, MD [facility] Pager: [phone] 05/04/2014 5:09 PM     Naomie Dean, MD Stroke Neurology  To contact Stroke Continuity provider, please refer to [email or web address]. After hours, contact General Neurology

## 2018-02-01 NOTE — Progress Notes (Signed)
Post left IJ line CxR film reviewed.  Discussed with bed side RN.  Plan: - Pull out line by 2 cm and get CxR. No pneumothorax.

## 2018-02-01 NOTE — Progress Notes (Signed)
No new events or problems overnight.  Patient with no complaints of headache.  She is afebrile.  Heart rate is normal.  She remains hypertensive.  O2 sats are good.  Patient awake and interactive.  Follows commands bilaterally.  Speech is fluent.  Follow-up head CT scan with right posterior inferior cerebellar infarct.  Overall there is not a large amount of mass-effect from this infarct.  Her fourth ventricle is not distorted.  There is no hydrocephalus.  Patient does have a significant right-sided posterior inferior cerebellar artery infarct.  Currently there are no indications for operative intervention.  I would recommend continued ICU observation.  Patient may be mobilized ad lib.

## 2018-02-01 NOTE — Progress Notes (Signed)
Patient seen and re-evaluated during Neuro night shift to assess for clinical stability. She states that there has been no change to her headache. Daughter states that patient's level of alertness and ability to converse has not changed.   BP (!) 197/72   Pulse 76   Temp 98.8 F (37.1 C) (Axillary)   Resp 14   Ht 5\' 2"  (1.575 m)   Wt 83.2 kg   SpO2 97%   BMI 33.55 kg/m   Physical exam: General: Obese female in NAD Lungs: Respirations unlabored  Neurological exam: Ment: Alert and oriented to day, month and situation. Not oriented to year. Able to name objects in Spanish and follow all motor commands. Good eye contact. Pleasant and cooperative.  CN: PERRL at 3 mm >> 2 mm. EOMI. No nystagmus. Face symmetric. No hoarseness or hypophonia.  Motor: 5/5 in all 4 extremities without asymmetry Cerebellar: FNF without ataxia bilaterally. Heel-shin also without ataxia bilaterally.   A/R: 57 year old female with large right cereballar ischemic infarction -- Neurological exam is stable -- Continue to monitor clinically. RN instructed to call neurology if the patient's exam changes or if headache worsens. Family instructed on neurological signs and symptoms to look out for, including increased drowsiness, confusion and double vision; if they see any changes they have been educated that they should call the RN immediately for repeat neuro check and possible re-evaluation by Neurology.   Electronically signed: Dr. Caryl PinaEric Hoang Reich

## 2018-02-01 NOTE — Progress Notes (Signed)
Hypertonic Saline re-ordered per Dr. Lucia GaskinsAhern. CCM consulted for central line placement. Pharmacy aware. Jayde Daffin, [place] ScrapeSarah E, RN

## 2018-02-01 NOTE — CV Procedure (Signed)
2D echo attempted, patient in CT. Will try later 

## 2018-02-01 NOTE — Progress Notes (Signed)
  Echocardiogram 2D Echocardiogram has been performed.  April Fry, April Fry R 02/01/2018, 11:47 AM

## 2018-02-01 NOTE — Progress Notes (Signed)
Occupational Therapy Evaluation  PTA,  Pt lived with significant other and was independent with mobility and ADL. Pt currently requires mod A with mobility and ADL due to significant deficits as listed below. Feel pt will make excellent progress at CIR to facilitate safe DC home with family. Will follow acutely. Systolic BP 497 sitting in chair - nsg notified.   I have discussed the patient's current level of function related to ADL and mobility with the patient and her daughter.  They acknowledge understanding of this and do not feel the patient would be able to have their care needs met at home.  They are interested in post-acute rehab in an inpatient setting.     02/01/18 1500  OT Visit Information  Last OT Received On 02/01/18  Assistance Needed +2  PT/OT/SLP Co-Evaluation/Treatment Yes  Reason for Co-Treatment Complexity of the patient's impairments (multi-system involvement);For patient/therapist safety;To address functional/ADL transfers  OT goals addressed during session ADL's and self-care  History of Present Illness April Fry is a 57 y.o. female with history of hypertension, diabetes. Came to ED with n/v, room spinning, and difficulty walking. 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  Precautions  Precautions Fall  Precaution Comments very dizzy, high fall risk  Restrictions  Weight Bearing Restrictions No  Home Living  Family/patient expects to be discharged to: Private residence  Living Arrangements Spouse/significant other  Available Help at Discharge Family  Type of [place] Access Level entry  Home Layout Two level;Bed/bath upstairs  Alternate Level Stairs-Number of Steps flight  Alternate Level Stairs-Rails Right  Bathroom Shower/Tub Tub/shower unit;Curtain  Corporate treasurer Yes  How Accessible Accessible via walker  Home Equipment None  Additional Comments daughter             lives 45  min away  Prior Function  Level of Independence Independent  Comments does not work;   likes to   Engineer, petroleum Prefers language other than English (Spainish)  Pain Assessment  Pain Assessment Faces  Faces Pain Scale 4  Pain Location head  Pain Descriptors / Indicators Aching  Pain Intervention(s) Limited activity within patient's tolerance  Cognition  Arousal/Alertness Lethargic  Behavior During Therapy Flat affect (then became upset due to impairments)  Overall Cognitive Status Impaired/Different from baseline  Area of Impairment Following commands;Safety/judgement;Awareness;Problem solving  Following Commands Follows one step commands with increased time  Safety/Judgement Decreased awareness of safety;Decreased awareness of deficits (due to quick onset of dizziness)  Awareness Emergent (states when she has to pee)  Problem Solving Requires verbal cues;Requires tactile cues;Slow processing  General Comments pt maintained eyes closed for majority of time due to "room spinning"; cognition most likely affected by pain/dizziness  Upper Extremity Assessment  Upper Extremity Assessment Generalized weakness (but functional; unable to open toothpaste cap)  Lower Extremity Assessment  Lower Extremity Assessment Defer to PT evaluation  Cervical / Trunk Assessment  Cervical / Trunk Assessment Other exceptions (postural control affected by "dizziness"); pt unable to sit unsupported EOB; thrust L and also posteriorly; pt staes she "feels like she is falling"  ADL  Overall ADL's  Needs assistance/impaired  Eating/Feeding Supervision/ safety;Set up  Grooming Minimal assistance;Sitting  Upper Body Bathing Minimal assistance;Sitting  Lower Body Bathing Moderate assistance;Sit to/from stand  Upper Body Dressing  Minimal assistance;Sitting  Lower Body Dressing Maximal assistance;Sit to/from Retail buyer +2 for physical assistance;Moderate assistance  Toileting-  Clothing Manipulation and Hygiene Moderate assistance  Functional mobility during ADLs Moderate assistance;+2 for physical assistance  General ADL Comments affected by difficulty maintaining postrual control due to dizziness; did not note ataxia  Vision- Assessment  Vision Assessment? Vision impaired- to be further tested in functional context  Additional Comments noted nystagmus - horizontal?; "should wear galsses but doesn't"; blurred vision  Bed Mobility  Overal bed mobility Needs Assistance  Bed Mobility Rolling;Sidelying to Sit  Rolling Min guard  Sidelying to sit Min assist;Mod assist  General bed mobility comments pt pushed self up however due to severe dizziness/room spinning pt unable to stop at midline and fell onto L side on bed requiring modA to maintain upright/midline position  Transfers  Overall transfer level Needs assistance  Equipment used 2 person hand held assist  Transfers Sit to/from [employer] Transfers  Sit to Stand Min assist;Mod assist;+2 physical assistance  Stand pivot transfers Min assist;Mod assist;+2 physical assistance  General transfer comment PT and OT with close body contact with patient to minimize posterior lean due to severe dizziness with position change  Balance  Overall balance assessment Needs assistance  Sitting-balance support Feet supported;Bilateral upper extremity supported  Sitting balance-Leahy Scale Zero  Sitting balance - Comments due to severe dizziness pt requires mod/maxA to maintain EOB balance  Standing balance support Bilateral upper extremity supported  Standing balance-Leahy Scale Poor  Standing balance comment due to severe dizziness pt requires external support to maintain balance, pt with lateral sway L/R  Exercises  Exercises Other exercises  Other Exercises  Other Exercises maintaining visual fixation on target  Other Exercises encouraged pt to fixate on target when moving  OT - End of Session  Equipment Utilized  During Treatment Gait belt  Activity Tolerance Patient tolerated treatment well  Patient left in chair;with call bell/phone within reach;with family/visitor present  Nurse Communication Mobility status;Other (comment) (BP)  OT Assessment  OT Recommendation/Assessment Patient needs continued OT Services  OT Visit Diagnosis Unsteadiness on feet (R26.81);Other abnormalities of gait and mobility (R26.89);Muscle weakness (generalized) (M62.81);Other symptoms and signs involving cognitive function;Pain  Pain - part of body  (head)  OT Problem List Decreased strength;Decreased activity tolerance;Impaired balance (sitting and/or standing);Impaired vision/perception;Decreased coordination;Decreased cognition;Decreased safety awareness;Decreased knowledge of use of DME or AE;Obesity;Pain  OT Plan  OT Frequency (ACUTE ONLY) Min 3X/week  OT Treatment/Interventions (ACUTE ONLY) Self-care/ADL training;Therapeutic exercise;Neuromuscular education;DME and/or AE instruction;Therapeutic activities;Cognitive remediation/compensation;Visual/perceptual remediation/compensation;Patient/family education;Balance training  AM-PAC OT "6 Clicks" Daily Activity Outcome Measure (Version 2)  Help from another person eating meals? 3  Help from another person taking care of personal grooming? 3  Help from another person toileting, which includes using toliet, bedpan, or urinal? 2  Help from another person bathing (including washing, rinsing, drying)? 2  Help from another person to put on and taking off regular upper body clothing? 3  Help from another person to put on and taking off regular lower body clothing? 2  6 Click Score 15  OT Recommendation  Recommendations for Other Services Rehab consult  Follow Up Recommendations CIR;Supervision/Assistance - 24 hour  OT Equipment 3 in 1 bedside commode;Tub/shower bench  Individuals Consulted  Consulted and Agree with Results and Recommendations Patient;Family member/caregiver   Family Member Consulted daughter/family  Acute Rehab OT Goals  Patient Stated Goal "to get better"  OT Goal Formulation With patient/family  Time For Goal Achievement 02/15/18  Potential to Achieve Goals Good  OT Time Calculation  OT Start Time (ACUTE ONLY) 1418  OT Stop Time (ACUTE ONLY) 1445  OT Time Calculation (  min) 27 min  OT General Charges  $OT Visit 1 Visit  OT Evaluation  $OT Eval Moderate Complexity 1 Mod  Written Expression  Dominant Hand Right   Maurie Boettcher, OT/L   Acute OT Clinical Specialist Acute Rehabilitation Services Pager [phone] Office [phone]

## 2018-02-02 ENCOUNTER — Inpatient Hospital Stay (HOSPITAL_COMMUNITY): Payer: Self-pay

## 2018-02-02 DIAGNOSIS — E876 Hypokalemia: Secondary | ICD-10-CM

## 2018-02-02 DIAGNOSIS — G936 Cerebral edema: Secondary | ICD-10-CM

## 2018-02-02 DIAGNOSIS — E1169 Type 2 diabetes mellitus with other specified complication: Secondary | ICD-10-CM

## 2018-02-02 DIAGNOSIS — E669 Obesity, unspecified: Secondary | ICD-10-CM

## 2018-02-02 LAB — GLUCOSE, CAPILLARY
Glucose-Capillary: 100 mg/dL — ABNORMAL HIGH (ref 70–99)
Glucose-Capillary: 108 mg/dL — ABNORMAL HIGH (ref 70–99)
Glucose-Capillary: 146 mg/dL — ABNORMAL HIGH (ref 70–99)
Glucose-Capillary: 174 mg/dL — ABNORMAL HIGH (ref 70–99)

## 2018-02-02 LAB — CBC
HCT: 37.8 % (ref 36.0–46.0)
Hemoglobin: 12.4 g/dL (ref 12.0–15.0)
MCH: 29.4 pg (ref 26.0–34.0)
MCHC: 32.8 g/dL (ref 30.0–36.0)
MCV: 89.6 fL (ref 80.0–100.0)
Platelets: 258 10*3/uL (ref 150–400)
RBC: 4.22 MIL/uL (ref 3.87–5.11)
RDW: 13 % (ref 11.5–15.5)
WBC: 11.9 10*3/uL — ABNORMAL HIGH (ref 4.0–10.5)
nRBC: 0 % (ref 0.0–0.2)

## 2018-02-02 LAB — SODIUM
SODIUM: 137 mmol/L (ref 135–145)
Sodium: 140 mmol/L (ref 135–145)
Sodium: 138 mmol/L (ref 135–145)

## 2018-02-02 LAB — BASIC METABOLIC PANEL
Anion gap: 11 (ref 5–15)
CO2: 25 mmol/L (ref 22–32)
Calcium: 8.2 mg/dL — ABNORMAL LOW (ref 8.9–10.3)
Chloride: 104 mmol/L (ref 98–111)
Creatinine, Ser: 0.38 mg/dL — ABNORMAL LOW (ref 0.44–1.00)
GFR calc Af Amer: >60 mL/min (ref >60)
GFR calc non Af Amer: >60 mL/min (ref >60)
Glucose, Bld: 119 mg/dL — ABNORMAL HIGH (ref 70–99)
Potassium: 2.8 mmol/L — ABNORMAL LOW (ref 3.5–5.1)
Sodium: 140 mmol/L (ref 135–145)

## 2018-02-02 MED ORDER — SODIUM CHLORIDE 3 % IV SOLN
INTRAVENOUS | Status: DC
  Administered 2018-02-02 (×3): 100 mL/h via INTRAVENOUS
  Administered 2018-02-03 – 2018-02-04 (×5): 125 mL/h via INTRAVENOUS
  Filled 2018-02-02 (×15): qty 500

## 2018-02-02 MED ORDER — FENTANYL CITRATE (PF) 100 MCG/2ML IJ SOLN
25.0000 ug | INTRAMUSCULAR | Status: DC | PRN
  Administered 2018-02-02 – 2018-02-04 (×5): 25 ug via INTRAVENOUS
  Filled 2018-02-02 (×5): qty 2

## 2018-02-02 MED ORDER — INSULIN ASPART 100 UNIT/ML ~~LOC~~ SOLN: 0.0000 [IU] | Freq: Every day | SUBCUTANEOUS | Status: DC

## 2018-02-02 MED ORDER — CHLORHEXIDINE GLUCONATE CLOTH 2 % EX PADS
6.0000 | MEDICATED_PAD | Freq: Every day | CUTANEOUS | Status: DC
  Administered 2018-02-03: 6 via TOPICAL

## 2018-02-02 MED ORDER — SODIUM CHLORIDE 0.9% FLUSH: 10.0000 mL | INTRAVENOUS | Status: DC | PRN

## 2018-02-02 MED ORDER — SODIUM CHLORIDE 0.9% FLUSH
10.0000 mL | Freq: Two times a day (BID) | INTRAVENOUS | Status: DC
  Administered 2018-02-03 – 2018-02-06 (×7): 10 mL

## 2018-02-02 MED ORDER — POTASSIUM CHLORIDE CRYS ER 20 MEQ PO TBCR
40.0000 meq | EXTENDED_RELEASE_TABLET | Freq: Four times a day (QID) | ORAL | Status: AC
  Administered 2018-02-02 (×2): 40 meq via ORAL
  Filled 2018-02-02 (×2): qty 2

## 2018-02-02 MED ORDER — INSULIN ASPART 100 UNIT/ML ~~LOC~~ SOLN
0.0000 [IU] | Freq: Three times a day (TID) | SUBCUTANEOUS | Status: DC
  Administered 2018-02-02: 3 [IU] via SUBCUTANEOUS
  Administered 2018-02-02 – 2018-02-03 (×4): 4 [IU] via SUBCUTANEOUS
  Administered 2018-02-04: 3 [IU] via SUBCUTANEOUS
  Administered 2018-02-04 (×2): 4 [IU] via SUBCUTANEOUS
  Administered 2018-02-05: 7 [IU] via SUBCUTANEOUS
  Administered 2018-02-05: 4 [IU] via SUBCUTANEOUS
  Administered 2018-02-05 – 2018-02-06 (×2): 7 [IU] via SUBCUTANEOUS
  Administered 2018-02-06 (×2): 4 [IU] via SUBCUTANEOUS
  Administered 2018-02-07 (×2): 7 [IU] via SUBCUTANEOUS
  Administered 2018-02-07: 4 [IU] via SUBCUTANEOUS
  Administered 2018-02-08: 3 [IU] via SUBCUTANEOUS

## 2018-02-02 NOTE — Consult Note (Signed)
NAME:  April Fry, MRN:  [id number], DOB:  Sep 11, 1960, LOS: 2 ADMISSION DATE:  01/31/2018, CONSULTATION DATE:  02/02/2018 REFERRING MD:  Dr. Jaynee Eagles, Neurology, CHIEF COMPLAINT:  Difficulty walking   Brief History   57 yo female with nausea, vomiting, vertigo from Rt cerebellar hemisphere infarct.    History of present illness   57 yo female with hx of DM, HTN developed vertigo on 12/22.  Woke up 12/23 with similar symptoms.  Had CT head in ER that showed cerebellar CVA.  Neurology consulted.  Started on 3% saline.  Noted to have electrolyte abnormalities and elevated blood pressure.  PCCM asked to assist with critical care management while she is in ICU.  She still has dizziness and headache.  Denies chest pain, dyspnea, or nausea.  Tolerating diet.  Interview conducted with assistance of Patent attorney.  Past Medical History  DM, HTN, PNA  Significant Hospital Events   12/23 admit 12/24 start 3% saline  Consults:  Neurosurgery  Procedures:  Lt IJ CVL 12/24 >>   Significant Diagnostic Tests:  CT angio head/neck 12/23 >> Rt inferior cerebellar infarct MRI brain 12/23 >> moderate acute Rt PICA cerebellar infarct with cytotoxic edema Echo 12/24 >> EF 55 to 60%, grade 1 DD, PAS 32 mmHg, ascending aorta 38 mm CT head 12/25 >> worsening cytotoxic edema  Micro Data:    Antimicrobials:    Interim history/subjective:    Objective   Blood pressure (!) 169/77, pulse 80, temperature 98.6 F (37 C), temperature source Oral, resp. rate 14, height '5\' 2"'  (1.575 m), weight 83.2 kg, SpO2 96 %.        Intake/Output Summary (Last 24 hours) at 02/02/2018 0813 Last data filed at 02/02/2018 0700 Gross per 24 hour  Intake 2654.28 ml  Output 3650 ml  Net -995.72 ml   Filed Weights   01/31/18 1700  Weight: 83.2 kg    Examination:  General - alert Eyes - pupils reactive ENT - no sinus tenderness, no stridor Cardiac - regular rate/rhythm, no murmur Chest - equal  breath sounds b/l, no wheezing or rales Abdomen - soft, non tender, + bowel sounds, no hepatosplenomegaly GU - no lesions noted Extremities - no cyanosis, clubbing, or edema Skin - no rashes Lymphatics - no lymphadenopathy Neuro - CN intact, normal strength, moves extremities, follows commands Psych - normal mood and behavior  Resolved Hospital Problem list     Assessment & Plan:   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 Plan - continue 3% saline with goal Na 150 to 155 - neurosurgery consulted >> no operative interventions at this time - f/u neuroimaging per neurology - continue ASA  Hypertension, HLD. Plan - goal SBP < 180 - continue norvasc, cozaar, crestor  DM type II. Plan - SSI with levemir - hold outpt metformin  Hypokalemia. Plan - replace K and f/u BMET, Mg  Best practice:  Diet: clear liquid DVT prophylaxis: SCD GI prophylaxis: not indicated Mobility: bed rest Code Status: full code Family Communication: update family at bedside  Labs    CMP Latest Ref Rng & Units 02/02/2018 02/01/2018 02/01/2018  Glucose 70 - 99 mg/dL 119(H) - -  BUN 6 - 20 mg/dL <5(L) - -  Creatinine 0.44 - 1.00 mg/dL 0.38(L) - -  Sodium 135 - 145 mmol/L 140 137 140  Potassium 3.5 - 5.1 mmol/L 2.8(L) - -  Chloride 98 - 111 mmol/L 104 - -  CO2 22 - 32 mmol/L 25 - -  Calcium 8.9 - 10.3 mg/dL 8.2(L) - -  Total Protein 6.5 - 8.1 g/dL - - -  Total Bilirubin 0.3 - 1.2 mg/dL - - -  Alkaline Phos 38 - 126 U/L - - -  AST 15 - 41 U/L - - -  ALT 0 - 44 U/L - - -   CBC Latest Ref Rng & Units 02/02/2018 01/31/2018 01/31/2018  WBC 4.0 - 10.5 K/uL 11.9(H) - 10.0  Hemoglobin 12.0 - 15.0 g/dL 12.4 15.0 13.8  Hematocrit 36.0 - 46.0 % 37.8 44.0 42.2  Platelets 150 - 400 K/uL 258 - 237   CBG (last 3)  Recent Labs    02/01/18 2353 02/02/18 0330 02/02/18 0732  GLUCAP 157* 100* 108*     Review of Systems:   Negative except above  Past Medical History  She,   has a past medical history of Diabetes mellitus without complication ([place]), Hypertension, and Pneumonia (05/21/2016).   Surgical History    Past Surgical History:  Procedure Laterality Date  . ABDOMINAL HYSTERECTOMY       Social History   reports that she has never smoked. She has never used smokeless tobacco. She reports current alcohol use. She reports that she does not use drugs.   Family History   Her family history is not on file.   Allergies No Known Allergies   Home Medications  Prior to Admission medications   Medication Sig Start Date End Date Taking? Authorizing Provider  amLODipine (NORVASC) 10 MG tablet Take 1 tablet (10 mg total) by mouth daily. 12/16/17  Yes Fulp, Cammie, MD  insulin glargine (LANTUS) 100 UNIT/ML injection Inject 0.16 mLs (16 Units total) into the skin at bedtime. 12/30/17  Yes Fulp, Cammie, MD  losartan (COZAAR) 100 MG tablet Take 1 tablet (100 mg total) by mouth daily. 12/30/17  Yes Fulp, Cammie, MD  metFORMIN (GLUCOPHAGE) 1000 MG tablet Take 1 tablet (1,000 mg total) by mouth 2 (two) times daily with a meal. 12/16/17  Yes Fulp, Cammie, MD  rosuvastatin (CRESTOR) 20 MG tablet Take 1 tablet (20 mg total) by mouth daily. 12/30/17  Yes Fulp, Cammie, MD  blood glucose meter kit and supplies KIT Dispense based on patient and insurance preference. Use up to four times daily as directed. (FOR ICD-9 250.00, 250.01). Patient not taking: Reported on 11/25/2017 05/23/16   Velvet Bathe, MD  Blood Glucose Monitoring Suppl (TRUE METRIX METER) w/Device KIT Use to check blood sugar in the morning. 12/16/17   Fulp, Cammie, MD  ciclopirox (PENLAC) 8 % solution Apply topically at bedtime. Apply over nail and surrounding skin. Apply daily over previous coat. After seven (7) days, may remove with alcohol and continue cycle. Patient not taking: Reported on 11/25/2017 07/22/16   Alfonse Spruce, FNP  glucose blood (TRUE METRIX BLOOD GLUCOSE TEST) test strip Use as instructed  12/16/17   Fulp, Cammie, MD  TRUEPLUS LANCETS 28G MISC Use to check blood sugar daily. 12/16/17   Antony Blackbird, MD     Critical care time: 32 minutes    D/w Dr. Pamelia Hoit, MD [place] 02/02/2018, 8:30 AM

## 2018-02-02 NOTE — Progress Notes (Signed)
Patient ID: April RingerMaria Arana-Torres, female   DOB: [birth year], 57 y.o.   MRN: [id number] Patient is alert and arousable with good level of consciousness No evidence of cortical drift Continue supportive care I have reviewed the CT myself and note that the fourth ventricle and basilar cisterns are still open I am doubtful she will need decompressive [place]

## 2018-02-02 NOTE — Progress Notes (Addendum)
CT completed. Images reviewed and compared to prior CT. The report states worsening edema. On my review of the images, no further decrease in the size of the 4th ventricle is seen and the CSF spaces at the level of the foramen magnum do not definitively appear smaller. The slices on the most recent CT are cut slightly obliquely to those on the prior scan, which gives the appearance of smaller CSF spaces if the hypodensities in the infarcted cerebellum are used as references for the slice levels; however, when the 4th ventricle and perimedullary CSF spaces are used as the reference for slices when comparing the two CTs, there does not appear to be any significant change in the size of the CSF spaces at each level.   Na is still in the 130's, therefore hypertonic saline rate is being increased to 100 cc/hr. Repeat CT scheduled in 12 hours.   Electronically signed: Dr. Caryl PinaEric Shenica Holzheimer

## 2018-02-02 NOTE — Progress Notes (Signed)
STROKE TEAM PROGRESS NOTE   INTERVAL HISTORY Family at bedside. Remains alert and oriented.  Repeat CTs show worsening however neurosurgery is following and she may not need occipital craniotomy.  CT every 12.  Should be at max swelling today which is the third day, should watch her for 2 more days, on 3%.  Vitals:   02/02/18 0400 02/02/18 0500 02/02/18 0600 02/02/18 0700  BP: (!) 176/67 (!) 160/61 (!) 168/72 (!) 169/77  Pulse: 81 78 78 80  Resp: 16 14 14 14   Temp: 98.6 F (37 C)     TempSrc: Oral     SpO2: 93% 96% 96% 96%  Weight:      Height:        CBC:  Recent Labs  Lab 01/31/18 1035 01/31/18 1045 02/02/18 0637  WBC 10.0  --  11.9*  NEUTROABS 8.5*  --   --   HGB 13.8 15.0 12.4  HCT 42.2 44.0 37.8  MCV 88.5  --  89.6  PLT 237  --  258    Basic Metabolic Panel:  Recent Labs  Lab 01/31/18 1035 01/31/18 1045  02/01/18 2357 02/02/18 0637  NA 135 137   < > 137 140  K 4.1 3.9  --   --  2.8*  CL 101 103  --   --  104  CO2 21*  --   --   --  25  GLUCOSE 319* 323*  --   --  119*  BUN 8 8  --   --  <5*  CREATININE 0.58 0.30*  --   --  0.38*  CALCIUM 8.9  --   --   --  8.2*   < > = values in this interval not displayed.   Lipid Panel:     Component Value Date/Time   CHOL 158 02/01/2018 0011   CHOL 199 11/25/2017 1040   TRIG 69 02/01/2018 0011   HDL 49 02/01/2018 0011   HDL 48 11/25/2017 1040   CHOLHDL 3.2 02/01/2018 0011   VLDL 14 02/01/2018 0011   LDLCALC 95 02/01/2018 0011   LDLCALC 128 (H) 11/25/2017 1040   HgbA1c:  Lab Results  Component Value Date   HGBA1C 8.9 (H) 02/01/2018   Urine Drug Screen:     Component Value Date/Time   LABOPIA NONE DETECTED 01/31/2018 1042   COCAINSCRNUR NONE DETECTED 01/31/2018 1042   LABBENZ NONE DETECTED 01/31/2018 1042   AMPHETMU NONE DETECTED 01/31/2018 1042   THCU NONE DETECTED 01/31/2018 1042   LABBARB NONE DETECTED 01/31/2018 1042    Alcohol Level     Component Value Date/Time   ETH <10 01/31/2018 1034     IMAGING Ct Angio Head W Or Wo Contrast  Result Date: 01/31/2018 CLINICAL DATA:  Unsteady gait. Vomiting. Blurred vision. Abnormal cerebellum by CT. EXAM: CT ANGIOGRAPHY HEAD AND NECK TECHNIQUE: Multidetector CT imaging of the head and neck was performed using the standard protocol during bolus administration of intravenous contrast. Multiplanar CT image reconstructions and MIPs were obtained to evaluate the vascular anatomy. Carotid stenosis measurements (when applicable) are obtained utilizing NASCET criteria, using the distal internal carotid diameter as the denominator. CONTRAST:  100mL ISOVUE-370 IOPAMIDOL (ISOVUE-370) INJECTION 76% COMPARISON:  Head CT same day FINDINGS: CTA NECK FINDINGS Aortic arch: Aortic arch is normal except for minimal atherosclerotic change. Right carotid system: Common carotid artery widely patent to the bifurcation. Mild atherosclerotic plaque at the carotid bifurcation but no stenosis or irregularity. Cervical ICA widely patent. Left carotid system:  Common carotid artery widely patent to the bifurcation. Carotid bifurcation is normal without soft or calcified plaque. Cervical ICA is normal. Vertebral arteries: Both vertebral artery origins are widely patent. No subclavian stenosis proximal to the origins. Both vertebral artery origins appear widely patent through the cervical region to the foramen magnum. Skeleton: Normal Other neck: No mass or lymphadenopathy. Upper chest: Negative Review of the MIP images confirms the above findings CTA HEAD FINDINGS Anterior circulation: Both internal carotid arteries are widely patent through the skull base and siphon regions. The anterior and middle cerebral vessels appear normal without stenosis, aneurysm or vascular malformation. No missing branch vessels are identified. Posterior circulation: Both vertebral arteries are patent through the foramen magnum to the basilar. No vertebral stenosis or irregularity. I think the posterior  inferior cerebellar arteries show flow on each side presently. Superior cerebellar and posterior cerebral arteries show flow. Venous sinuses: Patent and normal. Anatomic variants: None significant. Delayed phase: No abnormal enhancement. 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 Flow is present in both posterior inferior cerebellar arteries presently. No basilar artery disease or other posterior circulation branch vessel abnormality. Electronically Signed   By: Paulina Fusi M.D.   On: 01/31/2018 13:10   Dg Chest 1 View  Result Date: 02/01/2018 CLINICAL DATA:  Central line repositioning. EXAM: CHEST  1 VIEW COMPARISON:  Chest radiograph performed earlier today at 7:16 p.m. FINDINGS: The patient's left IJ line is now seen ending about the cavoatrial junction. The lungs are mildly hypoexpanded. Mild vascular crowding and vascular congestion are seen. Increased interstitial markings may reflect mild interstitial edema. There is no evidence of pleural effusion or pneumothorax. The cardiomediastinal silhouette is borderline enlarged. No acute osseous abnormalities are seen. IMPRESSION: 1. Left IJ line seen ending about the cavoatrial junction, in expected position. 2. Lungs mildly hypoexpanded. Mild vascular congestion and borderline cardiomegaly. Increased interstitial markings may reflect mild interstitial edema. Electronically Signed   By: Roanna Raider M.D.   On: 02/01/2018 21:36   Ct Head Wo Contrast  Result Date: 02/02/2018 CLINICAL DATA:  Follow up stroke. EXAM: CT HEAD WITHOUT CONTRAST TECHNIQUE: Contiguous axial images were obtained from the base of the skull through the vertex without intravenous contrast. COMPARISON:  CT HEAD February 01, 2018 FINDINGS: BRAIN: RIGHT inferior cerebellar evolving infarct with increased edema, downward herniation  with effaced foramen of Magendie and Luschka. Minimal suspected petechial hemorrhage though limited by streak artifact from skull-base. No hydrocephalus. No supratentorial infarct, mass effect or midline shift. No abnormal extra-axial fluid collections. VASCULAR: Unremarkable. SKULL/SOFT TISSUES: No skull fracture. No significant soft tissue swelling. ORBITS/SINUSES: The included ocular globes and orbital contents are normal.Trace paranasal sinus mucosal thickening. Mastoid air cells are well aerated. OTHER: None. IMPRESSION: 1. Evolving RIGHT PICA territory infarct with worsening edema (potentially cytotoxic) and minimal suspected petechial edema. No obstructive hydrocephalus though increasingly effaced foramen magnum at risk for hydrocephalus. 2. These results will be called to the ordering clinician or representative by the professional radiologist assistant, and communication documented in [employer] Dashboard. Electronically Signed   By: Awilda Metro M.D.   On: 02/02/2018 04:28   Ct Head Wo Contrast  Result Date: 02/01/2018 CLINICAL DATA:  Followup cerebellar infarction. EXAM: CT HEAD WITHOUT CONTRAST TECHNIQUE: Contiguous axial images were obtained from the base of the skull through the vertex without intravenous contrast. COMPARISON:  Multiple studies 01/31/2018  FINDINGS: Brain: Acute infarction within the inferior cerebellum on the right shows continued increase in low-density and swelling. No evidence of hemorrhage. Swallow and cerebellar tonsil protrudes through the foramen magnum. Mass-effect upon the fourth ventricle but without evidence of change in size of the lateral or third ventricles. Cerebral hemispheres continue to show a normal appearance. Vascular: No abnormal vascular finding by CT. Skull: Negative Sinuses/Orbits: Clear/normal Other: None IMPRESSION: Slight progression of low-density and swelling within the inferior cerebellum on the right consistent with posterior inferior cerebellar  artery territory infarction. Right cerebellar tonsil protrudes through the foramen magnum. Mass-effect upon the fourth ventricle but no sign obstruction or change in ventricular size. No sign of bleeding. Electronically Signed   By: Paulina Fusi M.D.   On: 02/01/2018 10:04   Ct Head Wo Contrast  Result Date: 01/31/2018 CLINICAL DATA:  Follow-up infarct. History of head and neck cancer, hypertension and diabetes. EXAM: CT HEAD WITHOUT CONTRAST TECHNIQUE: Contiguous axial images were obtained from the base of the skull through the vertex without intravenous contrast. COMPARISON:  None. FINDINGS: BRAIN: Wedge-like RIGHT inferior cerebellar hypodensity with regional mass effect, narrowed though patent fourth ventricle. No intraparenchymal hemorrhage, nor midline shift. The ventricles and sulci are normal. No acute large vascular territory infarcts. No abnormal extra-axial fluid collections. Basal cisterns are patent. VASCULAR: Unremarkable. SKULL/SOFT TISSUES: No skull fracture. No significant soft tissue swelling. ORBITS/SINUSES: The included ocular globes and orbital contents are normal.Trace paranasal sinus mucosal thickening. Mastoid air cells are well aerated. OTHER: None. IMPRESSION: Evolving acute RIGHT PICA territory infarct without hemorrhagic conversion. Regional mass effect without obstructive hydrocephalus. Electronically Signed   By: Awilda Metro M.D.   On: 01/31/2018 22:07   Ct Head Wo Contrast  Result Date: 01/31/2018 CLINICAL DATA:  Altered level of consciousness. Elevated glucose and decreased mental status. EXAM: CT HEAD WITHOUT CONTRAST TECHNIQUE: Contiguous axial images were obtained from the base of the skull through the vertex without intravenous contrast. COMPARISON:  None. FINDINGS: Brain: There is a large area of low-attenuation involving the right cerebral hemisphere which measures 3.8 x 4.1 cm, image 5/3. There is mass effect with shift of the midline to the left by  approximately 5 mm, image 5/3. The cerebral hemispheres appear normal. The ventricular volumes are unremarkable. There is no evidence for acute intracranial hemorrhage. Vascular: No hyperdense vessel or unexpected calcification. Skull: Normal. Negative for fracture or focal lesion. Sinuses/Orbits: Paranasal sinuses and mastoid air cells are clear. Other: None. IMPRESSION: 1. Large area of low density within the right cerebral hemisphere is identified compatible with infarct. This is likely subacute in nature and involves much of the entire right PICA territory. Further investigation with CTA of the head and neck and brain MRI is advised. Electronically Signed   By: Signa Kell M.D.   On: 01/31/2018 11:19   Ct Angio Neck W And/or Wo Contrast  Result Date: 01/31/2018 CLINICAL DATA:  Unsteady gait. Vomiting. Blurred vision. Abnormal cerebellum by CT. EXAM: CT ANGIOGRAPHY HEAD AND NECK TECHNIQUE: Multidetector CT imaging of the head and neck was performed using the standard protocol during bolus administration of intravenous contrast. Multiplanar CT image reconstructions and MIPs were obtained to evaluate the vascular anatomy. Carotid stenosis measurements (when applicable) are obtained utilizing NASCET criteria, using the distal internal carotid diameter as the denominator. CONTRAST:  ISOVUE-370 IOPAMIDOL (ISOVUE-370) INJECTION 76% COMPARISON:  Head CT same day FINDINGS: CTA NECK FINDINGS Aortic arch: Aortic arch is normal except for minimal atherosclerotic change. Right carotid system:  Common carotid artery widely patent to the bifurcation. Mild atherosclerotic plaque at the carotid bifurcation but no stenosis or irregularity. Cervical ICA widely patent. Left carotid system: Common carotid artery widely patent to the bifurcation. Carotid bifurcation is normal without soft or calcified plaque. Cervical ICA is normal. Vertebral arteries: Both vertebral artery origins are widely patent. No subclavian  stenosis proximal to the origins. Both vertebral artery origins appear widely patent through the cervical region to the foramen magnum. Skeleton: Normal Other neck: No mass or lymphadenopathy. Upper chest: Negative Review of the MIP images confirms the above findings CTA HEAD FINDINGS Anterior circulation: Both internal carotid arteries are widely patent through the skull base and siphon regions. The anterior and middle cerebral vessels appear normal without stenosis, aneurysm or vascular malformation. No missing branch vessels are identified. Posterior circulation: Both vertebral arteries are patent through the foramen magnum to the basilar. No vertebral stenosis or irregularity. I think the posterior inferior cerebellar arteries show flow on each side presently. Superior cerebellar and posterior cerebral arteries show flow. Venous sinuses: Patent and normal. Anatomic variants: None significant. Delayed phase: No abnormal enhancement. 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 Flow is present in both posterior inferior cerebellar arteries presently. No basilar artery disease or other posterior circulation branch vessel abnormality. Electronically Signed   By: Paulina Fusi M.D.   On: 01/31/2018 13:10   Mr Brain Wo Contrast  Result Date: 01/31/2018 CLINICAL DATA:  57 year old female with unsteady gait, vomiting, evidence of right cerebellar infarct on CT today. EXAM: MRI HEAD WITHOUT CONTRAST TECHNIQUE: Multiplanar, multiecho pulse sequences of the brain and surrounding structures were obtained without intravenous contrast. COMPARISON:  CT head and CTA head and neck earlier today. FINDINGS: Brain: Confluent restricted diffusion in the right inferior cerebellum, PICA territory extending from the vermis to the posterolateral surface up to 5.5 centimeters.  Estimated infarct volume is 26 mL. No involvement of the brainstem is evident. Cytotoxic edema and petechial hemorrhage (series 8, image 5). Overall mild posterior fossa mass effect including partial effacement of the 4th ventricle. However, lateral and 3rd ventricle size remains normal. No other restricted diffusion. No supratentorial mass effect. No extra-axial collection. Scattered mild to moderate for age nonspecific subcortical cerebral white matter T2 and FLAIR hyperintensity. Negative deep gray matter nuclei. Negative pituitary. Vascular: Major intracranial vascular flow voids are preserved. Skull and upper cervical spine: Negative visible cervical spine and spinal cord. Visualized bone marrow signal is within normal limits. Sinuses/Orbits: Negative orbits. Paranasal sinuses and mastoids are stable and well pneumatized. Other: Scalp and face soft tissues are negative. IMPRESSION: 1. Moderate size acute Right PICA cerebellar infarct with cytotoxic edema and petechial hemorrhage. Mild posterior fossa mass effect including partial effacement of the 4th ventricle, but no ventriculomegaly or malignant hemorrhagic transformation. 2. No brainstem infarct or other acute intracranial abnormality. 3. Mild to moderate for age nonspecific cerebral white matter signal changes. Study discussed by telephone with Dr. Arther Dames on 01/31/2018 at 16:06 . Electronically Signed   By: Odessa Fleming M.D.   On: 01/31/2018 16:06   Dg Chest Port 1 View  Result Date: 02/01/2018 CLINICAL DATA:  Cerebellar stroke.  Central line placement. EXAM: PORTABLE CHEST 1 VIEW COMPARISON:  02/01/2018 FINDINGS: New left jugular central venous catheter is seen with tip overlying the proximal to mid right atrium. No evidence of pneumothorax. Stable mild  cardiomegaly. Diffuse pulmonary vascular congestion shows no significant change. No evidence of pulmonary airspace disease or pleural effusion. IMPRESSION: Left jugular central venous catheter tip  overlies the proximal to mid right atrium. No evidence of pneumothorax. Stable mild cardiomegaly and pulmonary vascular congestion. Electronically Signed   By: Myles Rosenthal M.D.   On: 02/01/2018 19:47   Dg Chest Port 1 View  Result Date: 02/01/2018 CLINICAL DATA:  Stroke. EXAM: PORTABLE CHEST 1 VIEW COMPARISON:  01/31/2018. FINDINGS: Cardiomegaly. Mild vascular congestion. No consolidation or edema. Similar appearance to priors. IMPRESSION: Cardiomegaly with mild vascular congestion. Electronically Signed   By: Elsie Stain M.D.   On: 02/01/2018 08:31   Dg Chest Portable 1 View  Result Date: 01/31/2018 CLINICAL DATA:  Altered mental status EXAM: PORTABLE CHEST 1 VIEW COMPARISON:  06/03/2016 FINDINGS: Heart is borderline in size. Mild vascular congestion. Bibasilar atelectasis. No effusions or overt edema. No acute bony abnormality. IMPRESSION: Borderline heart size with vascular congestion and bibasilar atelectasis. Electronically Signed   By: Charlett Nose M.D.   On: 01/31/2018 11:20   Vas US Carotid (at [facility])  Result Date: 02/01/2018 Carotid Arterial Duplex Study Indications: CVA. Limitations: body habitus Performing Technologist: Levada Schilling RDMS, RVT  Examination Guidelines: A complete evaluation includes B-mode imaging, spectral Doppler, color Doppler, and power Doppler as needed of all accessible portions of each vessel. Bilateral testing is considered an integral part of a complete examination. Limited examinations for reoccurring indications may be performed as noted.  Right Carotid Findings: +----------+--------+--------+--------+--------+--------+           PSV cm/sEDV cm/sStenosisDescribeComments +----------+--------+--------+--------+--------+--------+ CCA Prox  85      12                               +----------+--------+--------+--------+--------+--------+ CCA Distal75      17                                +----------+--------+--------+--------+--------+--------+ ICA Prox  58      16                               +----------+--------+--------+--------+--------+--------+ ICA Mid   81      20                               +----------+--------+--------+--------+--------+--------+ ICA Distal87      26                               +----------+--------+--------+--------+--------+--------+ ECA       129     13                               +----------+--------+--------+--------+--------+--------+ +----------+--------+-------+--------+-------------------+           PSV cm/sEDV cmsDescribeArm Pressure (mmHG) +----------+--------+-------+--------+-------------------+ [id number]                                        +----------+--------+-------+--------+-------------------+ +---------+--------+--+--------+--+---------+ VertebralPSV cm/s49EDV cm/s12Antegrade +---------+--------+--+--------+--+---------+  Left Carotid Findings: +----------+--------+--------+--------+--------+--------+  PSV cm/sEDV cm/sStenosisDescribeComments +----------+--------+--------+--------+--------+--------+ CCA Prox  101     9                                +----------+--------+--------+--------+--------+--------+ CCA Distal73      12                               +----------+--------+--------+--------+--------+--------+ ICA Prox  78      16                               +----------+--------+--------+--------+--------+--------+ ICA Mid   90      23                               +----------+--------+--------+--------+--------+--------+ ICA Distal93      27                               +----------+--------+--------+--------+--------+--------+ ECA       257     27      >50%                     +----------+--------+--------+--------+--------+--------+ +----------+--------+--------+--------+-------------------+ SubclavianPSV cm/sEDV  cm/sDescribeArm Pressure (mmHG) +----------+--------+--------+--------+-------------------+           165                                         +----------+--------+--------+--------+-------------------+ +---------+--------+--+--------+--+---------+ VertebralPSV cm/s69EDV cm/s13Antegrade +---------+--------+--+--------+--+---------+  Summary: Right Carotid: Velocities in the right ICA are consistent with a 1-39% stenosis. Left Carotid: Velocities in the left ICA are consistent with a 1-39% stenosis.               The ECA appears <50% stenosed. Vertebrals: Bilateral vertebral arteries demonstrate antegrade flow. *See table(s) above for measurements and observations.  Electronically signed by Coral Else MD on 02/01/2018 at 10:56:32 AM.    Final    Carotid Doppler   There is 1-39% bilateral ICA stenosis. Vertebral artery flow is antegrade.   2D echocardiogram - Left ventricle: The cavity size was normal. There was mild concentric hypertrophy. Systolic function was normal. The estimated ejection fraction was in the range of 55% to 60%. Wall motion was normal; there were no regional wall motion abnormalities. Doppler parameters are consistent with abnormal left ventricular relaxation (grade 1 diastolic dysfunction). Doppler parameters are consistent with indeterminate ventricular filling pressure. - Aorta: Ascending aortic diameter: 38 mm (S). - Ascending aorta: The ascending aorta was mildly dilated. - Mitral valve: Transvalvular velocity was within the normal range. There was no evidence for stenosis. There was trivial regurgitation. - Right ventricle: The cavity size was normal. Wall thickness was normal. Systolic function was normal. - Tricuspid valve: There was trivial regurgitation. - Pulmonary arteries: Systolic pressure was within the normal range. PA peak pressure: 32 mm Hg (S).   Exam: No change in exam today NAD, pleasant                  Speech:    Speech is normal;  fluent and spontaneous with normal comprehension.  Cognition:    The patient is oriented to  person, place, and time;     recent and remote memory intact;     language fluent;    Cranial Nerves:    The pupils are equal, round, and reactive to light.Trigeminal sensation is intact and the muscles of mastication are normal. The face is symmetric. The palate elevates in the midline. Hearing intact. Voice is normal. Shoulder shrug is normal. The tongue has normal motion without fasciculations.   Coordination:  dysmetria left arm and leg  Motor Observation:    No asymmetry, no atrophy, and no involuntary movements noted. Tone:    Normal muscle tone.     Strength:    Strength is V/V in the upper and lower limbs.      Sensation: intact to LT   ASSESSMENT/PLAN Ms. Shakevia Sarris is a 57 y.o. female with history of HTN and diabetes presenting with nausea, vomiting, dizziness and unsteady gait.   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 Dutch Quint.  While there is increasing edema, not a candidate for surgery 12/24. Will follow.   CCM consulted to assist with medical management  CT head 12/23 1119 large R cerebral infarct R PICA territory  CTA head & neck 12/23 1241 R inferior cerebellar infarct  MRI 12/23 1554 mod R PICA cerebellar infarct w/ cytotoxic edema and petechial hmg. Mild effacement 4th ventricle. Mild Small vessel disease.   CT head 12/23 2055 evolving R PICA infarct w/o HT. Regional mass effect w/o hydrocephalus.   CT head 12/24 1004  Slight progression of stroke and selling inferior cerebellum territory.  Right cerebellar tonsil protruding through the foramen magnum.  Mass-effect on fourth ventricle but no sign of obstruction or increase in ventricular size.  No bleeding  CT  12/25 0417 evolving R PICA infarct w/ worsening edema and minimal suspected petechial edema. No obstructive hydrocephalus but increasingly effaced foramen magnum   carotid Doppler  B ICA 1-39% stenosis, VAs antegrade   Repeat CT 12/25 1600  2D Echo  EF 55-60%. No source of embolus   Consider TEE and loop to further evaluate for embolic source once patient stable  Consider hypercoagulable labs for workup in young  LDL 95  HgbA1c 8.9  HIV pending   RPR pending  SCDs for VTE prophylaxis  Patient passed swallowing.  We will start clear liquid diet in the event surgical intervention is indicated. As progresses, consider solid foods  No antithrombotic prior to admission, now on aspirin 81 mg daily . Continue aspirin.  Therapy recommendations:  CIR. Consult placed  Disposition:  pending   Cerebral edema Induced Hypernatremia  High risk for continued cerebral edema  CT with ongoing edema  Dr. Jordan Likes following  Na dropped throughout the day yesterday. Rate increased to 100 during the night  Na now 140  Continue 3% @ 100/hr  Goal Na 150-155  Check Na q 6h  central line placed 12/24  Headache  Secondary to stroke, edema  Ultram 100 q6h prn for moderate pain  Added fentanyl for severe pain  Avoid other NSAIDs, concern for narcotics as we are watching neuro status closely. Discussed with Consuella Lose, RN  Hypertension  Home Meds:  norvasc 10, cozaar 100  BP now 180s, Stable . Permissive hypertension (OK if < 220/120) but gradually normalize in 5-7 days . BP increasing steadily throughout the day .  Resumed home meds . Long-term BP goal normotensive  Hyperlipidemia  Home meds:  crestor 20  LDL 95, goal < 70  resumed home crestor in hospital  Continue statin at discharge  Diabetes type II  Home meds:  lantus 16 hs, glucophage 1000 bid  Was on insulin drip during the first night  Now on SSI & levemir 5 q12  HgbA1c 8.9, goal < 7.0  Glucoses  120-160s  Other Stroke Risk Factors  ETOH use, advised to drink no more than 1 drink(s) a day  Obesity, Body mass index is 33.55 kg/m., recommend weight loss, diet and exercise as appropriate   Hypokalemia, 2.8 - supplemented - recheck in am  Annie MainSharon Biby, MSN, APRN, ANVP-BC, AGPCNP-BC Advanced Practice Stroke Nurse [facility] See Amion for Schedule & Pager information 02/02/2018 1:38 PM   Hospital day # 2  This is a 57 year old female with hypertension and diabetes who presented with nausea dizziness and vomiting 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  She continues to have headaches and nausea.  Very concerning for edema over the next several days.  Discussed with Dr. Venetia MaxonStern and Dr. Dutch QuintPoole at this point   Repeat CTs show worsening however neurosurgery is following and she may not need occipital craniotomy.  CT every 12.  Should be at max swelling today which is the third day, should watch her for 2 more days, on 3%.  Personally examined patient and images, and have participated in and made any corrections needed to history, physical, neuro exam,assessment and plan as stated above.  I have personally obtained the history, evaluated lab date, reviewed imaging studies and agree with radiology interpretations.   This patient is critically ill and at significant risk of neurological worsening, death and care requires constant monitoring of vital signs, hemodynamics,respiratory and cardiac monitoring,review of multiple databases, neurological assessment, discussion with family, other specialists and medical decision making of high complexity.I  I spent 30 minutes of neurocritical care time in the care of this patient.  Naomie DeanAntonia Lennie Dunnigan, MD [facility] Pager: [phone] 05/04/2014 5:09 PM   To contact Stroke Continuity provider, please refer to [email or web address]. After hours, contact General Neurology

## 2018-02-03 ENCOUNTER — Inpatient Hospital Stay (HOSPITAL_COMMUNITY): Payer: Self-pay

## 2018-02-03 ENCOUNTER — Encounter (HOSPITAL_COMMUNITY): Payer: Self-pay | Admitting: Physical Medicine and Rehabilitation

## 2018-02-03 DIAGNOSIS — E1159 Type 2 diabetes mellitus with other circulatory complications: Secondary | ICD-10-CM

## 2018-02-03 DIAGNOSIS — I10 Essential (primary) hypertension: Secondary | ICD-10-CM

## 2018-02-03 LAB — BASIC METABOLIC PANEL
Anion gap: 10 (ref 5–15)
BUN: <5 mg/dL — ABNORMAL LOW (ref 6–20)
CO2: 24 mmol/L (ref 22–32)
Calcium: 8.1 mg/dL — ABNORMAL LOW (ref 8.9–10.3)
Chloride: 105 mmol/L (ref 98–111)
Creatinine, Ser: 0.45 mg/dL (ref 0.44–1.00)
GFR calc Af Amer: >60 mL/min (ref >60)
Glucose, Bld: 171 mg/dL — ABNORMAL HIGH (ref 70–99)
Potassium: 3 mmol/L — ABNORMAL LOW (ref 3.5–5.1)
Sodium: 139 mmol/L (ref 135–145)

## 2018-02-03 LAB — GLUCOSE, CAPILLARY
Glucose-Capillary: 130 mg/dL — ABNORMAL HIGH (ref 70–99)
Glucose-Capillary: 189 mg/dL — ABNORMAL HIGH (ref 70–99)
Glucose-Capillary: 155 mg/dL — ABNORMAL HIGH (ref 70–99)
Glucose-Capillary: 163 mg/dL — ABNORMAL HIGH (ref 70–99)
Glucose-Capillary: 164 mg/dL — ABNORMAL HIGH (ref 70–99)
Glucose-Capillary: 165 mg/dL — ABNORMAL HIGH (ref 70–99)
Glucose-Capillary: 153 mg/dL — ABNORMAL HIGH (ref 70–99)

## 2018-02-03 LAB — SODIUM
SODIUM: 139 mmol/L (ref 135–145)
SODIUM: 140 mmol/L (ref 135–145)
Sodium: 140 mmol/L (ref 135–145)

## 2018-02-03 LAB — CBC
HCT: 38.1 % (ref 36.0–46.0)
Hemoglobin: 12.4 g/dL (ref 12.0–15.0)
MCH: 29.1 pg (ref 26.0–34.0)
MCHC: 32.5 g/dL (ref 30.0–36.0)
MCV: 89.4 fL (ref 80.0–100.0)
Platelets: 221 10*3/uL (ref 150–400)
RBC: 4.26 MIL/uL (ref 3.87–5.11)
RDW: 13 % (ref 11.5–15.5)
WBC: 7.6 10*3/uL (ref 4.0–10.5)
nRBC: 0 % (ref 0.0–0.2)

## 2018-02-03 LAB — MAGNESIUM: Magnesium: 2 mg/dL (ref 1.7–2.4)

## 2018-02-03 LAB — HIV ANTIBODY (ROUTINE TESTING W REFLEX): HIV Screen 4th Generation wRfx: NONREACTIVE

## 2018-02-03 LAB — RPR: RPR Ser Ql: NONREACTIVE

## 2018-02-03 MED ORDER — POTASSIUM CHLORIDE CRYS ER 20 MEQ PO TBCR
20.0000 meq | EXTENDED_RELEASE_TABLET | Freq: Three times a day (TID) | ORAL | Status: AC
  Administered 2018-02-03 – 2018-02-04 (×6): 20 meq via ORAL
  Filled 2018-02-03 (×6): qty 1

## 2018-02-03 MED ORDER — HYDROCHLOROTHIAZIDE 12.5 MG PO CAPS: 12.5000 mg | ORAL_CAPSULE | Freq: Every day | ORAL | Status: DC

## 2018-02-03 MED ORDER — LABETALOL HCL 100 MG PO TABS
100.0000 mg | ORAL_TABLET | Freq: Two times a day (BID) | ORAL | Status: DC
  Administered 2018-02-03 – 2018-02-04 (×4): 100 mg via ORAL
  Filled 2018-02-03 (×4): qty 1

## 2018-02-03 NOTE — Progress Notes (Addendum)
Subjective: Patient reports through interpreter that she feels better, with less headache today  Objective: Vital signs in last 24 hours: Temp:  [97.9 F (36.6 C)-99.4 F (37.4 C)] 99.4 F (37.4 C) (12/26 1200) Pulse Rate:  [77-95] 80 (12/26 1300) Resp:  [12-23] 15 (12/26 1300) BP: (119-192)/(61-90) 133/61 (12/26 1300) SpO2:  [92 %-96 %] 93 % (12/26 1300)  Intake/Output from previous day: 12/25 0701 - 12/26 0700 In: 2482.3 [P.O.:480; I.V.:2002.3] Out: 4300 [Urine:4300] Intake/Output this shift: Total I/O In: 773.8 [I.V.:773.8] Out: 800 [Urine:800]  Alert, following commands all extremities, smiling. Reports decreased headache.   Lab Results: Recent Labs    02/02/18 0637 02/03/18 0530  WBC 11.9* 7.6  HGB 12.4 12.4  HCT 37.8 38.1  PLT 258 221   BMET Recent Labs    02/02/18 0637  02/03/18 0530 02/03/18 1233  NA 140   < > 139 140  K 2.8*  --  3.0*  --   CL 104  --  105  --   CO2 25  --  24  --   GLUCOSE 119*  --  171*  --   BUN <5*  --  <5*  --   CREATININE 0.38*  --  0.45  --   CALCIUM 8.2*  --  8.1*  --    < > = values in this interval not displayed.    Studies/Results: Dg Chest 1 View  Result Date: 02/01/2018 CLINICAL DATA:  Central line repositioning. EXAM: CHEST  1 VIEW COMPARISON:  Chest radiograph performed earlier today at 7:16 p.m. FINDINGS: The patient's left IJ line is now seen ending about the cavoatrial junction. The lungs are mildly hypoexpanded. Mild vascular crowding and vascular congestion are seen. Increased interstitial markings may reflect mild interstitial edema. There is no evidence of pleural effusion or pneumothorax. The cardiomediastinal silhouette is borderline enlarged. No acute osseous abnormalities are seen. IMPRESSION: 1. Left IJ line seen ending about the cavoatrial junction, in expected position. 2. Lungs mildly hypoexpanded. Mild vascular congestion and borderline cardiomegaly. Increased interstitial markings may reflect mild  interstitial edema. Electronically Signed   By: Roanna RaiderJeffery  Chang M.D.   On: 02/01/2018 21:36   Ct Head Wo Contrast  Result Date: 02/02/2018 CLINICAL DATA:  Stroke follow-up EXAM: CT HEAD WITHOUT CONTRAST TECHNIQUE: Contiguous axial images were obtained from the base of the skull through the vertex without intravenous contrast. COMPARISON:  CT head 02/02/2018, MRI 01/31/2018 FINDINGS: Brain: Right PICA infarct shows progressive edema and mass-effect. Effacement of the fourth ventricle without hydrocephalus. No hemorrhage. Progressive low-density in the left cerebellum mostly in the region of the brachium pontis. Question edema. No infarct is seen on recent MRI in this territory. Negative for acute infarct or hydrocephalus. Vascular: Negative for hyperdense vessel Skull: Negative Sinuses/Orbits: Negative Other: None IMPRESSION: Progressive edema and mass-effect due to right PICA infarct. No hemorrhage Progressive low density in the left brachium pontis which may be edema. No infarct in this area on recent MRI. Electronically Signed   By: Marlan Palauharles  Clark M.D.   On: 02/02/2018 17:10   Ct Head Wo Contrast  Result Date: 02/02/2018 CLINICAL DATA:  Follow up stroke. EXAM: CT HEAD WITHOUT CONTRAST TECHNIQUE: Contiguous axial images were obtained from the base of the skull through the vertex without intravenous contrast. COMPARISON:  CT HEAD February 01, 2018 FINDINGS: BRAIN: RIGHT inferior cerebellar evolving infarct with increased edema, downward herniation with effaced foramen of Magendie and Luschka. Minimal suspected petechial hemorrhage though limited by streak artifact from skull-base.  No hydrocephalus. No supratentorial infarct, mass effect or midline shift. No abnormal extra-axial fluid collections. VASCULAR: Unremarkable. SKULL/SOFT TISSUES: No skull fracture. No significant soft tissue swelling. ORBITS/SINUSES: The included ocular globes and orbital contents are normal.Trace paranasal sinus mucosal  thickening. Mastoid air cells are well aerated. OTHER: None. IMPRESSION: 1. Evolving RIGHT PICA territory infarct with worsening edema (potentially cytotoxic) and minimal suspected petechial edema. No obstructive hydrocephalus though increasingly effaced foramen magnum at risk for hydrocephalus. 2. These results will be called to the ordering clinician or representative by the professional radiologist assistant, and communication documented in zVision Dashboard. Electronically Signed   By: Awilda Metroourtnay  Bloomer M.D.   On: 02/02/2018 04:28   Mr Brain Wo Contrast  Result Date: 02/03/2018 CLINICAL DATA:  Follow-up cerebellar infarcts. History of hypertension. EXAM: MRI HEAD WITHOUT CONTRAST TECHNIQUE: Multiplanar, multiecho pulse sequences of the brain and surrounding structures were obtained without intravenous contrast. COMPARISON:  CT HEAD February 02, 2018 and MRI of the head January 31, 2018 FINDINGS: INTRACRANIAL CONTENTS: With low ADC values. No LEFT cerebellar reduced diffusion. RIGHT inferior cerebellar punctate susceptibility artifact compatible with petechial hemorrhage. Similar edema and regional mass effect partially effacing the fourth ventricle. No obstructive hydrocephalus. A few supratentorial punctate white matter FLAIR T2 hyperintensities compatible with mild chronic small vessel ischemic change. No intraparenchymal mass of or supratentorial mass effect. No abnormal extra-axial fluid collections. VASCULAR: Normal major intracranial vascular flow voids present at skull base. SKULL AND UPPER CERVICAL SPINE: No abnormal sellar expansion. No suspicious calvarial bone marrow signal. Craniocervical junction maintained. SINUSES/ORBITS: The mastoid air-cells and included paranasal sinuses are well-aerated.The included ocular globes and orbital contents are non-suspicious. OTHER: None. IMPRESSION: 1. Evolving acute RIGHT cerebellar PICA territory infarct with petechial hemorrhage. Partially effaced fourth  ventricle without obstructive hydrocephalus. 2. No LEFT brachium pontis abnormality. Electronically Signed   By: Awilda Metroourtnay  Bloomer M.D.   On: 02/03/2018 03:54    Assessment/Plan: improving  LOS: 3 days  hopeful for CIR   Georgiann Cockeroteat, Brian 02/03/2018, 2:41 PM  Patient is stable.  Awaiting Rehab.

## 2018-02-03 NOTE — Progress Notes (Signed)
Physical Therapy Treatment Patient Details Name: April Fry MRN: [id number] DOB: 11-08-1960 Today's Date: 02/03/2018    History of Present Illness April Fry is a 57 y.o. female with history of hypertension, diabetes. Came to ED with n/v, room spinning, and difficulty walking. 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    PT Comments    Patient progressing with some in room ambulation this session, though not well tolerated with with +2 A needed due to imbalance and symptomatic dizziness.  She was able to participate in ADLs with OT and progressed within session when recovery time allowed.  Seen with in room interpreter due to pt Spanish speaking.  Feel she is appropriate for CIR level rehab upon d/c.     Follow Up Recommendations  CIR     Equipment Recommendations  None recommended by PT(TBA)    Recommendations for Other Services       Precautions / Restrictions Precautions Precautions: Fall Precaution Comments: very dizzy, high fall risk Restrictions Weight Bearing Restrictions: No    Mobility  Bed Mobility Overal bed mobility: Needs Assistance Bed Mobility: Supine to Sit     Supine to sit: Min guard;+2 for safety/equipment     General bed mobility comments: patient eager to exit bed, transitioning to EOB with min guard +2 for safety   Transfers Overall transfer level: Needs assistance Equipment used: 2 person hand held assist Transfers: Sit to/from [employer] CorporationStand;Stand Pivot Transfers Sit to Stand: Min assist;+2 physical assistance Stand pivot transfers: Min assist;+2 physical assistance       General transfer comment: min assist +2 for safety and balance due to dizziness, impulsively [facility] and [facility]   Ambulation/Gait Ambulation/Gait assistance: Mod assist;Min assist;+2 safety/equipment Gait Distance (Feet): 5 Feet Assistive device: 2 person hand held assist Gait Pattern/deviations: Step-to pattern;Decreased stride  length;Shuffle     General Gait Details: hesistant and limited due to nausea and dizziness, imbalance with posterior bias at times as well   Stairs             Wheelchair Mobility    Modified Rankin (Stroke Patients Only) Modified Rankin (Stroke Patients Only) Pre-Morbid Rankin Score: No symptoms Modified Rankin: Moderately severe disability     Balance Overall balance assessment: Needs assistance Sitting-balance support: Feet supported;Bilateral upper extremity supported Sitting balance-Leahy Scale: Fair Sitting balance - Comments: min guard to close supervision at EOB and on BSC   Standing balance support: Bilateral upper extremity supported Standing balance-Leahy Scale: Poor Standing balance comment: attempting to assist with perineal hygiene in standing due to bed soiled with urine, min/mod A for balance +2 for safety                             Cognition Arousal/Alertness: Awake/alert Behavior During Therapy: Flat affect Overall Cognitive Status: Impaired/Different from baseline Area of Impairment: Following commands;Safety/judgement;Awareness;Problem solving                       Following Commands: Follows one step commands with increased time Safety/Judgement: Decreased awareness of safety;Decreased awareness of deficits Awareness: Emergent Problem Solving: Requires verbal cues;Requires tactile cues;Slow processing General Comments: patient implusive throughout session, especially with transfers anticipate partially from nausea and dizziness      Exercises      General Comments General comments (skin integrity, edema, etc.): daughter in room throughout session      Pertinent Vitals/Pain Pain Assessment: 0-10 Pain Score: 7  Pain Location: head Pain Descriptors / Indicators: Headache Pain Intervention(s): Monitored during session;Repositioned    Home Living                      Prior Function            PT Goals  (current goals can now be found in the care plan section) Acute Rehab PT Goals Patient Stated Goal: "to get better" Progress towards PT goals: Progressing toward goals    Frequency    Min 4X/week      PT Plan Current plan remains appropriate    Co-evaluation PT/OT/SLP Co-Evaluation/Treatment: Yes Reason for Co-Treatment: Complexity of the patient's impairments (multi-system involvement);To address functional/ADL transfers;For patient/therapist safety PT goals addressed during session: Mobility/safety with mobility;Balance OT goals addressed during session: ADL's and self-care;Other (comment)(mobility)      AM-PAC PT "6 Clicks" Mobility   Outcome Measure  Help needed turning from your back to your side while in a flat bed without using bedrails?: A Little Help needed moving from lying on your back to sitting on the side of a flat bed without using bedrails?: A Little Help needed moving to and from a bed to a chair (including a wheelchair)?: A Lot Help needed standing up from a chair using your arms (e.g., wheelchair or bedside chair)?: A Lot Help needed to walk in hospital room?: A Lot Help needed climbing 3-5 steps with a railing? : Total 6 Click Score: 13    End of Session Equipment Utilized During Treatment: Gait belt Activity Tolerance: Patient limited by fatigue;Patient limited by pain Patient left: with call bell/phone within reach;in chair;with family/visitor present Nurse Communication: Mobility status PT Visit Diagnosis: Unsteadiness on feet (R26.81);Muscle weakness (generalized) (M62.81);Difficulty in walking, not elsewhere classified (R26.2)     Time: [id number] PT Time Calculation (min) (ACUTE ONLY): 23 min  Charges:  $Therapeutic Activity: 8-22 mins                     Sheran LawlessCyndi Aaralyn Kil, [place]PT Acute Rehabilitation Services [phone] 02/03/2018    Elray Mcgregorynthia Tanija Germani 02/03/2018, 5:49 PM

## 2018-02-03 NOTE — Progress Notes (Signed)
IP rehab admissions - I met with patient's daughter at the bedside.  Patient was asleep.  Daughter would like inpatient rehab admission.  Patient speaks only Spanish so will need interpreter.  Patient currently on 3% NS IV and thus not ready today for CIR.  I will follow up in am for progress.  Call me for questions.  [phone]

## 2018-02-03 NOTE — Progress Notes (Signed)
Occupational Therapy Treatment Patient Details Name: April Fry MRN: [id number] DOB: 07-15-1960 Today's Date: 02/03/2018    History of present illness April Fry is a 57 y.o. female with history of hypertension, diabetes. Came to ED with n/v, room spinning, and difficulty walking. 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   OT comments  Patient eager to participate in session and get OOB today, engaged in OT/PT cotx.  Completes bed mobility with min guard +2, stood at EOB with min assist +2 safety while engaging in hygiene care and gown change, then completed toilet transfers with min assist +2, and grooming with setup assist seated in recliner.  She demonstrates some impulsively throughout session, especially with transfers but anticipate due to nausea and dizziness.  Limited by nausea, dizziness, and headache, as well as safety awareness.  Continue to assess vision. DC plan remains appropriate. Will continue to follow.    Follow Up Recommendations  CIR;Supervision/Assistance - 24 hour    Equipment Recommendations  3 in 1 bedside commode;Tub/shower bench    Recommendations for Other Services Rehab consult    Precautions / Restrictions Precautions Precautions: Fall Precaution Comments: very dizzy, high fall risk Restrictions Weight Bearing Restrictions: No       Mobility Bed Mobility Overal bed mobility: Needs Assistance Bed Mobility: Supine to Sit     Supine to sit: Min guard;+2 for safety/equipment     General bed mobility comments: patient eager to exit bed, transitioning to EOB with min guard +2 for safety   Transfers Overall transfer level: Needs assistance Equipment used: 2 person hand held assist Transfers: Sit to/from Stand;Stand Pivot Transfers Sit to Stand: Min assist;+2 physical assistance Stand pivot transfers: Min assist;+2 physical assistance       General transfer comment: min assist +2 for safety and balance due  to dizziness, impulsively transferring on and off BSC     Balance Overall balance assessment: Needs assistance Sitting-balance support: Feet supported;Bilateral upper extremity supported Sitting balance-Leahy Scale: Fair Sitting balance - Comments: min guard to close supervision at EOB and on BSC   Standing balance support: Bilateral upper extremity supported Standing balance-Leahy Scale: Poor Standing balance comment: reliant on UE and external support due to dizziness, increased sway in standing                            ADL either performed or assessed with clinical judgement   ADL Overall ADL's : Needs assistance/impaired     Grooming: Oral care;Brushing hair;Supervision/safety;Set up;Sitting Grooming Details (indicate cue type and reason): increased time to manage toothpaste and mouthwash      Lower Body Bathing: Minimal assistance;+2 for safety/equipment;Sit to/from stand Lower Body Bathing Details (indicate cue type and reason): min assist +2 safety for bathing peri and buttocks area standing at EOB         Toilet Transfer: Minimal assistance;+2 for safety/equipment;Stand-pivot;BSC Toilet Transfer Details (indicate cue type and reason): cueing for hand placement and safety, impulsive Toileting- Clothing Manipulation and Hygiene: Sit to/from stand;Minimal assistance;+2 for safety/equipment Toileting - Clothing Manipulation Details (indicate cue type and reason): for safety      Functional mobility during ADLs: Minimal assistance;+2 for safety/equipment General ADL Comments: continues to be limited by dizziness and nausea with transfers, noted improved sitting balance min guard for safety at times only     Vision   Additional Comments: further assessment needed   Perception     Praxis  Cognition Arousal/Alertness: Awake/alert Behavior During Therapy: Flat affect Overall Cognitive Status: Impaired/Different from baseline Area of Impairment:  Following commands;Safety/judgement;Awareness;Problem solving                       Following Commands: Follows one step commands with increased time Safety/Judgement: Decreased awareness of safety;Decreased awareness of deficits Awareness: Emergent Problem Solving: Requires verbal cues;Requires tactile cues;Slow processing General Comments: patient implusive throughout session, especially with transfers anticipate partially from nausea and dizziness        Exercises     Shoulder Instructions       General Comments VSS    Pertinent Vitals/ Pain       Pain Assessment: 0-10 Pain Score: 7  Pain Location: head Pain Descriptors / Indicators: Aching Pain Intervention(s): Limited activity within patient's tolerance;Repositioned;Premedicated before session  Home Living                                          Prior Functioning/Environment              Frequency  Min 3X/week        Progress Toward Goals  OT Goals(current goals can now be found in the care plan section)  Progress towards OT goals: Progressing toward goals  Acute Rehab OT Goals Patient Stated Goal: "to get better" OT Goal Formulation: With patient/family Time For Goal Achievement: 02/15/18 Potential to Achieve Goals: Good  Plan Discharge plan remains appropriate;Frequency remains appropriate    Co-evaluation    PT/OT/SLP Co-Evaluation/Treatment: Yes Reason for Co-Treatment: Complexity of the patient's impairments (multi-system involvement);For patient/therapist safety;To address functional/ADL transfers   OT goals addressed during session: ADL's and self-care;Other (comment)(mobility)      AM-PAC OT "6 Clicks" Daily Activity     Outcome Measure   Help from another person eating meals?: A Little Help from another person taking care of personal grooming?: None Help from another person toileting, which includes using toliet, bedpan, or urinal?: A Lot Help from another  person bathing (including washing, rinsing, drying)?: A Little Help from another person to put on and taking off regular upper body clothing?: A Little Help from another person to put on and taking off regular lower body clothing?: A Lot 6 Click Score: 17    End of Session    OT Visit Diagnosis: Unsteadiness on feet (R26.81);Other abnormalities of gait and mobility (R26.89);Muscle weakness (generalized) (M62.81);Other symptoms and signs involving cognitive function;Pain Pain - part of body: (head)   Activity Tolerance Patient tolerated treatment well   Patient Left in chair;with call bell/phone within reach;with family/visitor present   Nurse Communication Mobility status        Time: [id number] OT Time Calculation (min): 27 min  Charges: OT General Charges $OT Visit: 1 Visit OT Treatments $Self Care/Home Management : 8-22 mins  Chancy Milroyhristie S Roshunda Keir, OT Acute Rehabilitation Services Pager [phone] Office [phone]    Chancy MilroyChristie S Kameah Rawl 02/03/2018, 3:04 PM

## 2018-02-03 NOTE — Consult Note (Signed)
Physical Medicine and Rehabilitation Consult   Reason for Consult Functional deficits due to stroke.  Referring Physician: Dr. Erlinda Hong   HPI: April Fry is a 57 y.o. non english speaking RH-female with history of T2DM, HTN who was admitted on 01/31/18 with N/V, difficulty walking and progressive lethargy. . CT head done revealing head done revealing large area of low density in right cerebral hemisphere involving entire R-PICA territory. 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 CTA head/neck was negative for vertebral stenosis, occlusion or dissection. She was started on hypertonic saline and monitored in ICU.  She continues to have issues with headaches, hyperglycemia and elevated BP.   Dr. Annette Stable consulted for input and recommended observation as no indications for surgical intervention. 2D echo revealed EF 55-60% with no wall abnormality and grade 1 diastolic dysfunction.  Carotid dopplers without ICA stenosis. TEE and loop recommended for work up of embolic source.  Follow up MRI brain this am showed evolving R-cerebellar infarct with partially effaced 4th ventricle without hydrocephalus. Therapy evaluation done and patient limited by severe dizziness and headaches with positional changes. CIR recommended due to functional decline.    Used interpreter line as well as daughter to assist with  exam/details.    Review of Systems  Constitutional: Positive for malaise/fatigue (on the weekends). Negative for chills and fever.  HENT: Negative for hearing loss.   Eyes: Positive for blurred vision (on the left).  Respiratory: Negative for cough and sputum production.   Cardiovascular: Positive for leg swelling (BLE tend to swell). Negative for chest pain.  Gastrointestinal: Positive for constipation and nausea. Negative for heartburn and vomiting.  Genitourinary: Positive for  frequency and urgency.  Musculoskeletal: Positive for joint pain and myalgias. Back pain: hips and knees.  Neurological: Positive for dizziness, tingling, sensory change (burning/ tingling BLE from knees down--affects mobility.), weakness and headaches. Negative for focal weakness.    Past Medical History:  Diagnosis Date  . Diabetes mellitus without complication ([place])   . Hypertension   . Pneumonia 05/21/2016    Past Surgical History:  Procedure Laterality Date  . ABDOMINAL HYSTERECTOMY      Family History  Problem Relation Age of Onset  . Other Mother        parents passed away in an [place] in 96's  . Heart attack Sister   . Obesity Sister      Social History:  Lives with her partner. Has 3 daughters in [place] who are planning on assisting after discharge. Attempts to work part time but limited by neuropathy?.  She  reports that she has never smoked. She has never used smokeless tobacco. She reports she uses alcohol on rare occasions.  She reports that she does not use drugs.    Allergies: No Known Allergies    Facility-Administered Medications Prior to Admission  Medication Dose Route Frequency Provider Last Rate Last Dose  . pneumococcal 13-valent conjugate vaccine (PREVNAR 13) injection 0.5 mL  0.5 mL Intramuscular Once Alfonse Spruce, FNP       Medications Prior to Admission  Medication Sig Dispense Refill  . amLODipine (NORVASC) 10 MG tablet Take 1 tablet (10 mg total) by mouth daily. 30 tablet 2  . insulin glargine (LANTUS) 100 UNIT/ML injection Inject 0.16 mLs (16 Units total) into the skin at bedtime. 10 mL 0  . losartan (COZAAR) 100 MG tablet Take 1 tablet (100 mg total)  by mouth daily. 30 tablet 2  . metFORMIN (GLUCOPHAGE) 1000 MG tablet Take 1 tablet (1,000 mg total) by mouth 2 (two) times daily with a meal. 180 tablet 3  . rosuvastatin (CRESTOR) 20 MG tablet Take 1 tablet (20 mg total) by mouth daily. 30 tablet 2  . blood glucose meter kit and supplies  KIT Dispense based on patient and insurance preference. Use up to four times daily as directed. (FOR ICD-9 250.00, 250.01). (Patient not taking: Reported on 11/25/2017) 1 each 0  . Blood Glucose Monitoring Suppl (TRUE METRIX METER) w/Device KIT Use to check blood sugar in the morning. 1 kit 0  . ciclopirox (PENLAC) 8 % solution Apply topically at bedtime. Apply over nail and surrounding skin. Apply daily over previous coat. After seven (7) days, may remove with alcohol and continue cycle. (Patient not taking: Reported on 11/25/2017) 6.6 mL 2  . glucose blood (TRUE METRIX BLOOD GLUCOSE TEST) test strip Use as instructed 100 each 12  . TRUEPLUS LANCETS 28G MISC Use to check blood sugar daily. 100 each 11    Home: Home Living Family/patient expects to be discharged to:: Private residence Living Arrangements: Spouse/significant other Available Help at Discharge: Family Type of Home: Apartment Home Access: Level entry Home Layout: Two level, Bed/bath upstairs Alternate Level Stairs-Number of Steps: flight Alternate Level Stairs-Rails: Right Bathroom Shower/Tub: Tub/shower unit, Architectural technologist: Standard Bathroom Accessibility: Yes Home Equipment: None Additional Comments: daughter             lives 45 min away  Functional History: Prior Function Level of Independence: Independent Comments: does not work;   likes to  Functional Status:  Mobility: Bed Mobility Overal bed mobility: Needs Assistance Bed Mobility: Rolling, Sidelying to Sit Rolling: Min guard Sidelying to sit: Min assist, Mod assist General bed mobility comments: pt pushed self up however due to severe dizziness/room spinning pt unable to stop at midline and fell onto L side on bed requiring modA to maintain upright/midline position Transfers Overall transfer level: Needs assistance Equipment used: 2 person hand held assist Transfers: Sit to/from Stand, Stand Pivot Transfers Sit to Stand: Min assist, Mod assist, +2  physical assistance Stand pivot transfers: Min assist, Mod assist, +2 physical assistance General transfer comment: PT and OT with close body contact with patient to minimize posterior lean due to severe dizziness with position change Ambulation/Gait General Gait Details: unable this date due to dizzines    ADL: ADL Overall ADL's : Needs assistance/impaired Eating/Feeding: Supervision/ safety, Set up Grooming: Minimal assistance, Sitting Upper Body Bathing: Minimal assistance, Sitting Lower Body Bathing: Moderate assistance, Sit to/from stand Upper Body Dressing : Minimal assistance, Sitting Lower Body Dressing: Maximal assistance, Sit to/from stand Toilet Transfer: +2 for physical assistance, Moderate assistance Toileting- Clothing Manipulation and Hygiene: Moderate assistance Functional mobility during ADLs: Moderate assistance, +2 for physical assistance General ADL Comments: affected by difficulty maintaining postrual control due to dizziness; did not note ataxia  Cognition: Cognition Overall Cognitive Status: Impaired/Different from baseline Orientation Level: Oriented X4 Cognition Arousal/Alertness: Lethargic Behavior During Therapy: Flat affect(then became upset due to impairments) Overall Cognitive Status: Impaired/Different from baseline Area of Impairment: Following commands, Safety/judgement, Awareness, Problem solving Following Commands: Follows one step commands with increased time Safety/Judgement: Decreased awareness of safety, Decreased awareness of deficits(due to quick onset of dizziness) Awareness: Emergent(states when she has to pee) Problem Solving: Requires verbal cues, Requires tactile cues, Slow processing General Comments: pt maintained eyes closed for majority of time due to "room spinning"  Blood pressure (!) 160/90, pulse 95, temperature 98.8 F (37.1 C), temperature source Oral, resp. rate (!) 23, height _0  (1.575 m), weight 83.2 kg, SpO2 96  %. Physical Exam  Nursing note and vitals reviewed. Constitutional: She appears well-developed and well-nourished. No distress.  Lying in bed --holding her head due to HA.  Asking when she can go home.   HENT:  Head: Normocephalic.  Eyes: Pupils are equal, round, and reactive to light.  Neck: Normal range of motion.  Cardiovascular: Normal rate.  Respiratory: Effort normal.  GI: Soft.  Neurological: She is alert.  Oriented to self and place. Follows simple commands in spanish and with visual cueing. Nystagmus to left lateral field. Mild right limb ataxia but able to coordinate FTF and FTN. Strength 5/5 right and 4 to 4+/5 on left. Senses pain in all 4's.   Skin: Skin is warm. She is not diaphoretic.  Psychiatric:  Flat but appropriate    Results for orders placed or performed during the hospital encounter of 01/31/18 (from the past 24 hour(s))  Glucose, capillary     Status: Abnormal   Collection Time: 02/02/18 11:39 AM  Result Value Ref Range   Glucose-Capillary 146 (H) 70 - 99 mg/dL   Comment 1 Notify RN    Comment 2 Document in Chart   Sodium     Status: None   Collection Time: 02/02/18 12:47 PM  Result Value Ref Range   Sodium 140 135 - 145 mmol/L  Glucose, capillary     Status: Abnormal   Collection Time: 02/02/18  3:54 PM  Result Value Ref Range   Glucose-Capillary 174 (H) 70 - 99 mg/dL   Comment 1 Notify RN    Comment 2 Document in Chart   Sodium     Status: None   Collection Time: 02/02/18  6:19 PM  Result Value Ref Range   Sodium 138 135 - 145 mmol/L  Glucose, capillary     Status: Abnormal   Collection Time: 02/02/18  7:31 PM  Result Value Ref Range   Glucose-Capillary 189 (H) 70 - 99 mg/dL  Glucose, capillary     Status: Abnormal   Collection Time: 02/02/18 10:19 PM  Result Value Ref Range   Glucose-Capillary 155 (H) 70 - 99 mg/dL  Sodium     Status: None   Collection Time: 02/03/18 12:16 AM  Result Value Ref Range   Sodium 140 135 - 145 mmol/L  Basic  metabolic panel     Status: Abnormal   Collection Time: 02/03/18  5:30 AM  Result Value Ref Range   Sodium 139 135 - 145 mmol/L   Potassium 3.0 (L) 3.5 - 5.1 mmol/L   Chloride 105 98 - 111 mmol/L   CO2 24 22 - 32 mmol/L   Glucose, Bld 171 (H) 70 - 99 mg/dL   BUN <5 (L) 6 - 20 mg/dL   Creatinine, Ser 0.45 0.44 - 1.00 mg/dL   Calcium 8.1 (L) 8.9 - 10.3 mg/dL   GFR calc non Af Amer >60 >60 mL/min   GFR calc Af Amer >60 >60 mL/min   Anion gap 10 5 - 15  Magnesium     Status: None   Collection Time: 02/03/18  5:30 AM  Result Value Ref Range   Magnesium 2.0 1.7 - 2.4 mg/dL  CBC     Status: None   Collection Time: 02/03/18  5:30 AM  Result Value Ref Range   WBC 7.6 4.0 - 10.5 K/uL  RBC 4.26 3.87 - 5.11 MIL/uL   Hemoglobin 12.4 12.0 - 15.0 g/dL   HCT 38.1 36.0 - 46.0 %   MCV 89.4 80.0 - 100.0 fL   MCH 29.1 26.0 - 34.0 pg   MCHC 32.5 30.0 - 36.0 g/dL   RDW 13.0 11.5 - 15.5 %   Platelets 221 150 - 400 K/uL   nRBC 0.0 0.0 - 0.2 %  Glucose, capillary     Status: Abnormal   Collection Time: 02/03/18  7:45 AM  Result Value Ref Range   Glucose-Capillary 163 (H) 70 - 99 mg/dL   Dg Chest 1 View  Result Date: 02/01/2018 CLINICAL DATA:  Central line repositioning. EXAM: CHEST  1 VIEW COMPARISON:  Chest radiograph performed earlier today at 7:16 p.m. FINDINGS: The patient's left IJ line is now seen ending about the cavoatrial junction. The lungs are mildly hypoexpanded. Mild vascular crowding and vascular congestion are seen. Increased interstitial markings may reflect mild interstitial edema. There is no evidence of pleural effusion or pneumothorax. The cardiomediastinal silhouette is borderline enlarged. No acute osseous abnormalities are seen. IMPRESSION: 1. Left IJ line seen ending about the cavoatrial junction, in expected position. 2. Lungs mildly hypoexpanded. Mild vascular congestion and borderline cardiomegaly. Increased interstitial markings may reflect mild interstitial edema.  Electronically Signed   By: Garald Balding M.D.   On: 02/01/2018 21:36   Ct Head Wo Contrast  Result Date: 02/02/2018 CLINICAL DATA:  Stroke follow-up EXAM: CT HEAD WITHOUT CONTRAST TECHNIQUE: Contiguous axial images were obtained from the base of the skull through the vertex without intravenous contrast. COMPARISON:  CT head 02/02/2018, MRI 01/31/2018 FINDINGS: Brain: Right PICA infarct shows progressive edema and mass-effect. Effacement of the fourth ventricle without hydrocephalus. No hemorrhage. Progressive low-density in the left cerebellum mostly in the region of the brachium pontis. Question edema. No infarct is seen on recent MRI in this territory. Negative for acute infarct or hydrocephalus. Vascular: Negative for hyperdense vessel Skull: Negative Sinuses/Orbits: Negative Other: None IMPRESSION: Progressive edema and mass-effect due to right PICA infarct. No hemorrhage Progressive low density in the left brachium pontis which may be edema. No infarct in this area on recent MRI. Electronically Signed   By: Franchot Gallo M.D.   On: 02/02/2018 17:10   Ct Head Wo Contrast  Result Date: 02/02/2018 CLINICAL DATA:  Follow up stroke. EXAM: CT HEAD WITHOUT CONTRAST TECHNIQUE: Contiguous axial images were obtained from the base of the skull through the vertex without intravenous contrast. COMPARISON:  CT HEAD February 01, 2018 FINDINGS: BRAIN: RIGHT inferior cerebellar evolving infarct with increased edema, downward herniation with effaced foramen of Magendie and Luschka. Minimal suspected petechial hemorrhage though limited by streak artifact from skull-base. No hydrocephalus. No supratentorial infarct, mass effect or midline shift. No abnormal extra-axial fluid collections. VASCULAR: Unremarkable. SKULL/SOFT TISSUES: No skull fracture. No significant soft tissue swelling. ORBITS/SINUSES: The included ocular globes and orbital contents are normal.Trace paranasal sinus mucosal thickening. Mastoid air cells  are well aerated. OTHER: None. IMPRESSION: 1. Evolving RIGHT PICA territory infarct with worsening edema (potentially cytotoxic) and minimal suspected petechial edema. No obstructive hydrocephalus though increasingly effaced foramen magnum at risk for hydrocephalus. 2. These results will be called to the ordering clinician or representative by the professional radiologist assistant, and communication documented in [facility]board. Electronically Signed   By: Elon Alas M.D.   On: 02/02/2018 04:28   Ct Head Wo Contrast  Result Date: 02/01/2018 CLINICAL DATA:  Followup cerebellar infarction. EXAM: CT HEAD WITHOUT  CONTRAST TECHNIQUE: Contiguous axial images were obtained from the base of the skull through the vertex without intravenous contrast. COMPARISON:  Multiple studies 01/31/2018 FINDINGS: Brain: Acute infarction within the inferior cerebellum on the right shows continued increase in low-density and swelling. No evidence of hemorrhage. Swallow and cerebellar tonsil protrudes through the foramen magnum. Mass-effect upon the fourth ventricle but without evidence of change in size of the lateral or third ventricles. Cerebral hemispheres continue to show a normal appearance. Vascular: No abnormal vascular finding by CT. Skull: Negative Sinuses/Orbits: Clear/normal Other: None IMPRESSION: Slight progression of low-density and swelling within the inferior cerebellum on the right consistent with posterior inferior cerebellar artery territory infarction. Right cerebellar tonsil protrudes through the foramen magnum. Mass-effect upon the fourth ventricle but no sign obstruction or change in ventricular size. No sign of bleeding. Electronically Signed   By: Nelson Chimes M.D.   On: 02/01/2018 10:04   Mr Brain Wo Contrast  Result Date: 02/03/2018 CLINICAL DATA:  Follow-up cerebellar infarcts. History of hypertension. EXAM: MRI HEAD WITHOUT CONTRAST TECHNIQUE: Multiplanar, multiecho pulse sequences of the  brain and surrounding structures were obtained without intravenous contrast. COMPARISON:  CT HEAD February 02, 2018 and MRI of the head January 31, 2018 FINDINGS: INTRACRANIAL CONTENTS: With low ADC values. No LEFT cerebellar reduced diffusion. RIGHT inferior cerebellar punctate susceptibility artifact compatible with petechial hemorrhage. Similar edema and regional mass effect partially effacing the fourth ventricle. No obstructive hydrocephalus. A few supratentorial punctate white matter FLAIR T2 hyperintensities compatible with mild chronic small vessel ischemic change. No intraparenchymal mass of or supratentorial mass effect. No abnormal extra-axial fluid collections. VASCULAR: Normal major intracranial vascular flow voids present at skull base. SKULL AND UPPER CERVICAL SPINE: No abnormal sellar expansion. No suspicious calvarial bone marrow signal. Craniocervical junction maintained. SINUSES/ORBITS: The mastoid air-cells and included paranasal sinuses are well-aerated.The included ocular globes and orbital contents are non-suspicious. OTHER: None. IMPRESSION: 1. Evolving acute RIGHT cerebellar PICA territory infarct with petechial hemorrhage. Partially effaced fourth ventricle without obstructive hydrocephalus. 2. No LEFT brachium pontis abnormality. Electronically Signed   By: Elon Alas M.D.   On: 02/03/2018 03:54     Assessment/Plan: Diagnosis: right PICA/cerebellar infarct  1. Does the need for close, 24 hr/day medical supervision in concert with the patient's rehab needs make it unreasonable for this patient to be served in a less intensive setting? Yes 2. Co-Morbidities requiring supervision/potential complications: HTN, DM, morbid obesity 3. Due to bladder management, bowel management, safety, skin/wound care, disease management, medication administration, pain management and patient education, does the patient require 24 hr/day rehab nursing? Yes 4. Does the patient require coordinated  care of a physician, rehab nurse, PT (1-2 hrs/day, 5 days/week) and OT (1-2 hrs/day, 5 days/week) to address physical and functional deficits in the context of the above medical diagnosis(es)? Yes Addressing deficits in the following areas: balance, endurance, locomotion, strength, transferring, bowel/bladder control, bathing, dressing, feeding, grooming, toileting, cognition and psychosocial support 5. Can the patient actively participate in an intensive therapy program of at least 3 hrs of therapy per day at least 5 days per week? Yes 6. The potential for patient to make measurable gains while on inpatient rehab is excellent 7. Anticipated functional outcomes upon discharge from inpatient rehab are supervision  with PT, supervision with OT, n/a with SLP. 8. Estimated rehab length of stay to reach the above functional goals is: 8-13 days 9. Anticipated D/C setting: Home 10. Anticipated post D/C treatments: HH therapy and Outpatient therapy 11. Overall  Rehab/Functional Prognosis: excellent  RECOMMENDATIONS: This patient's condition is appropriate for continued rehabilitative care in the following setting: CIR Patient has agreed to participate in recommended program. Yes Note that insurance prior authorization may be required for reimbursement for recommended care.  Comment: Rehab Admissions Coordinator to follow up.  Thanks,  Meredith Staggers, MD, Mellody Drown  I have personally performed a face to face diagnostic evaluation of this patient. Additionally, I have reviewed and concur with the physician assistant's documentation above.    Bary Leriche, PA-C 02/03/2018

## 2018-02-03 NOTE — Progress Notes (Signed)
NAME:  April RingerMaria Fry, MRN:  [id number], DOB:  12-31-1960, LOS: 3 ADMISSION DATE:  01/31/2018, CONSULTATION DATE:  02/02/2018 REFERRING MD:  Dr. Lucia GaskinsAhern, Neurology, CHIEF COMPLAINT:  Difficulty walking   Brief History   57 yo female with nausea, vomiting, vertigo from Rt cerebellar hemisphere infarct.    Past Medical History  DM, HTN, PNA  Significant Hospital Events   12/23 admit 12/24 start 3% saline  Consults:  Neurosurgery  Procedures:  Lt IJ CVL 12/24 >>   Significant Diagnostic Tests:  CT angio head/neck 12/23 >> Rt inferior cerebellar infarct MRI brain 12/23 >> moderate acute Rt PICA cerebellar infarct with cytotoxic edema Echo 12/24 >> EF 55 to 60%, grade 1 DD, PAS 32 mmHg, ascending aorta 38 mm CT head 12/25 >> worsening cytotoxic edema  Micro Data:    Antimicrobials:    Interim history/subjective:  Still has headache, but better.  Denies nausea, chest pain.  Objective   Blood pressure (!) 173/70, pulse 81, temperature 98.8 F (37.1 C), temperature source Oral, resp. rate 16, height 5\' 2"  (1.575 m), weight 83.2 kg, SpO2 96 %.        Intake/Output Summary (Last 24 hours) at 02/03/2018 1211 Last data filed at 02/03/2018 1000 Gross per 24 hour  Intake 2537.13 ml  Output 4200 ml  Net -1662.87 ml   Filed Weights   01/31/18 1700  Weight: 83.2 kg    Examination:  General - alert Eyes - pupils reactive ENT - no sinus tenderness, no stridor Cardiac - regular rate/rhythm, no murmur Chest - equal breath sounds b/l, no wheezing or rales Abdomen - soft, non tender, + bowel sounds Extremities - no cyanosis, clubbing, or edema Skin - no rashes Neuro - normal strength, moves extremities, follows commands Psych - normal mood and behavior   Resolved Hospital Problem list     Assessment & Plan:   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 Plan - 3% saline with goal Na 150 to 155 per neurology - continue ASA - seen by  neurosurgery >> no indication for neurosurgical intervention at this time  Hypertension, HLD. Plan - goal SBP < 180 - continue norvasc, cozaar, crestor  DM type II. Plan - SSI with levemir - hold outpt metformin  Hypokalemia. Plan - replace K  Best practice:  Diet: clear liquid DVT prophylaxis: SCD GI prophylaxis: not indicated Mobility: bed rest Code Status: full code Family Communication: updated family at bedside  Labs    CMP Latest Ref Rng & Units 02/03/2018 02/03/2018 02/02/2018  Glucose 70 - 99 mg/dL 409(W171(H) - -  BUN 6 - 20 mg/dL <1(X<5(L) - -  Creatinine 9.140.44 - 1.00 mg/dL 7.820.45 - -  Sodium 956135 - 145 mmol/L 139 140 138  Potassium 3.5 - 5.1 mmol/L 3.0(L) - -  Chloride 98 - 111 mmol/L 105 - -  CO2 22 - 32 mmol/L 24 - -  Calcium 8.9 - 10.3 mg/dL 8.1(L) - -  Total Protein 6.5 - 8.1 g/dL - - -  Total Bilirubin 0.3 - 1.2 mg/dL - - -  Alkaline Phos 38 - 126 U/L - - -  AST 15 - 41 U/L - - -  ALT 0 - 44 U/L - - -   CBC Latest Ref Rng & Units 02/03/2018 02/02/2018 01/31/2018  WBC 4.0 - 10.5 K/uL 7.6 11.9(H) -  Hemoglobin 12.0 - 15.0 g/dL 21.312.4 08.612.4 57.815.0  Hematocrit 36.0 - 46.0 % 38.1 37.8 44.0  Platelets 150 - 400 K/uL 221  258 -   CBG (last 3)  Recent Labs    02/02/18 2219 02/03/18 0745 02/03/18 1126  GLUCAP 155* 163* 164*    Coralyn HellingVineet Lind Ausley, MD [facility] 02/03/2018, 12:11 PM

## 2018-02-03 NOTE — Progress Notes (Signed)
STROKE TEAM PROGRESS NOTE   INTERVAL HISTORY Family at bedside. Remains alert and oriented.  MRI Brain today shows evolving right cerebellar infarct with some petechial hemorrhage.  No hydrocephalus.  BP 160-170's.    Vitals:   02/03/18 0600 02/03/18 0700 02/03/18 0800 02/03/18 0900  BP: (!) 156/67 (!) 172/82 119/71 (!) 160/90  Pulse: 87 90 94 95  Resp: 14 14 13  (!) 23  Temp:   98.8 F (37.1 C)   TempSrc:   Oral   SpO2: 95% 95% 96% 96%  Weight:      Height:        CBC:  Recent Labs  Lab 01/31/18 1035  02/02/18 0637 02/03/18 0530  WBC 10.0  --  11.9* 7.6  NEUTROABS 8.5*  --   --   --   HGB 13.8   < > 12.4 12.4  HCT 42.2   < > 37.8 38.1  MCV 88.5  --  89.6 89.4  PLT 237  --  258 221   < > = values in this interval not displayed.    Basic Metabolic Panel:  Recent Labs  Lab 02/02/18 0637  02/03/18 0016 02/03/18 0530  NA 140   < > 140 139  K 2.8*  --   --  3.0*  CL 104  --   --  105  CO2 25  --   --  24  GLUCOSE 119*  --   --  171*  BUN <5*  --   --  <5*  CREATININE 0.38*  --   --  0.45  CALCIUM 8.2*  --   --  8.1*  MG  --   --   --  2.0   < > = values in this interval not displayed.   Lipid Panel:     Component Value Date/Time   CHOL 158 02/01/2018 0011   CHOL 199 11/25/2017 1040   TRIG 69 02/01/2018 0011   HDL 49 02/01/2018 0011   HDL 48 11/25/2017 1040   CHOLHDL 3.2 02/01/2018 0011   VLDL 14 02/01/2018 0011   LDLCALC 95 02/01/2018 0011   LDLCALC 128 (H) 11/25/2017 1040   HgbA1c:  Lab Results  Component Value Date   HGBA1C 8.9 (H) 02/01/2018   Urine Drug Screen:     Component Value Date/Time   LABOPIA NONE DETECTED 01/31/2018 1042   COCAINSCRNUR NONE DETECTED 01/31/2018 1042   LABBENZ NONE DETECTED 01/31/2018 1042   AMPHETMU NONE DETECTED 01/31/2018 1042   THCU NONE DETECTED 01/31/2018 1042   LABBARB NONE DETECTED 01/31/2018 1042    Alcohol Level     Component Value Date/Time   ETH <10 01/31/2018 1034    IMAGING  Dg Chest 1  View 02/01/2018 IMPRESSION:  1. Left IJ line seen ending about the cavoatrial junction, in expected position.  2. Lungs mildly hypoexpanded. Mild vascular congestion and borderline cardiomegaly. Increased interstitial markings may reflect mild interstitial edema.    Ct Head Wo Contrast 02/02/2018 IMPRESSION:  Progressive edema and mass-effect due to right PICA infarct. No hemorrhage Progressive low density in the left brachium pontis which may be edema. No infarct in this area on recent MRI.    Ct Head Wo Contrast 02/02/2018 IMPRESSION:  Evolving RIGHT PICA territory infarct with worsening edema (potentially cytotoxic) and minimal suspected petechial edema. No obstructive hydrocephalus though increasingly effaced foramen magnum at risk for hydrocephalus.    Ct Head Wo Contrast 02/01/2018 IMPRESSION: Slight progression of low-density and swelling within the inferior cerebellum  on the right consistent with posterior inferior cerebellar artery territory infarction. Right cerebellar tonsil protrudes through the foramen magnum. Mass-effect upon the fourth ventricle but no sign obstruction or change in ventricular size. No sign of bleeding.   Mr Brain Wo Contrast 02/03/2018 IMPRESSION:  1. Evolving acute RIGHT cerebellar PICA territory infarct with petechial hemorrhage. Partially effaced fourth ventricle without obstructive hydrocephalus.  2. No LEFT brachium pontis abnormality.    Carotid Doppler   There is 1-39% bilateral ICA stenosis. Vertebral artery flow is antegrade.   2D echocardiogram - Left ventricle: The cavity size was normal. There was mild concentric hypertrophy. Systolic function was normal. The estimated ejection fraction was in the range of 55% to 60%. Wall motion was normal; there were no regional wall motion abnormalities. Doppler parameters are consistent with abnormal left ventricular relaxation (grade 1 diastolic dysfunction). Doppler parameters are consistent with  indeterminate ventricular filling pressure. - Aorta: Ascending aortic diameter: 38 mm (S). - Ascending aorta: The ascending aorta was mildly dilated. - Mitral valve: Transvalvular velocity was within the normal range. There was no evidence for stenosis. There was trivial regurgitation. - Right ventricle: The cavity size was normal. Wall thickness was normal. Systolic function was normal. - Tricuspid valve: There was trivial regurgitation. - Pulmonary arteries: Systolic pressure was within the normal range. PA peak pressure: 32 mm Hg (S).   Exam: No change in exam today NAD, pleasant                  Speech:    Speech is normal; fluent and spontaneous with normal comprehension.  Cognition:    The patient is oriented to person, place, and time;     recent and remote memory intact;     language fluent;    Cranial Nerves:    The pupils are equal, round, and reactive to light.Trigeminal sensation is intact and the muscles of mastication are normal. The face is symmetric. The palate elevates in the midline. Hearing intact. Voice is normal. Shoulder shrug is normal. The tongue has normal motion without fasciculations.   Coordination:  dysmetria right arm but normal right leg  Motor Observation:    No asymmetry, no atrophy, and no involuntary movements noted. Tone:    Normal muscle tone.     Strength:    Strength is V/V in the upper and lower limbs.      Sensation: intact to LT   ASSESSMENT/PLAN April Fry is a 57 y.o. female with history of HTN and diabetes presenting with nausea, vomiting, dizziness and unsteady gait.   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 Dutch QuintPoole.  While there is increasing edema, not a candidate for surgery 12/24. Will follow.   CCM consulted to assist  with medical management  CT head 12/23 1119 large R cerebral infarct R PICA territory  CTA head & neck 12/23 1241 R inferior cerebellar infarct  MRI 12/23 1554 mod R PICA cerebellar infarct w/ cytotoxic edema and petechial hmg. Mild effacement 4th ventricle. Mild Small vessel disease.   CT head 12/23 2055 evolving R PICA infarct w/o HT. Regional mass effect w/o hydrocephalus.   CT head 12/24 1004  Slight progression of stroke and selling inferior cerebellum territory.  Right cerebellar tonsil protruding through the foramen magnum.  Mass-effect on fourth ventricle but no sign  of obstruction or increase in ventricular size.  No bleeding  CT 12/25 0417 evolving R PICA infarct w/ worsening edema and minimal suspected petechial edema. No obstructive hydrocephalus but increasingly effaced foramen magnum   carotid Doppler  B ICA 1-39% stenosis, VAs antegrade   Repeat CT 12/25 1600  2D Echo  EF 55-60%. No source of embolus   Consider TEE and loop to further evaluate for embolic source once patient stable  Consider hypercoagulable labs for workup in young  LDL 95  HgbA1c 8.9  HIV pending   RPR pending  SCDs for VTE prophylaxis  Patient passed swallowing.  We will start clear liquid diet in the event surgical intervention is indicated. As progresses, consider solid foods  No antithrombotic prior to admission, now on aspirin 81 mg daily . Continue aspirin.  Therapy recommendations:  CIR. Consult placed  Disposition:  pending   Cerebral edema Induced Hypernatremia  High risk for continued cerebral edema  CT with ongoing edema  Dr. Jordan Likes following  Na dropped throughout the day yesterday. Rate increased to 100 during the night  Na now 140  Continue 3% @ 100/hr  Goal Na 150-155  Check Na q 6h  central line placed 12/24  Headache  Secondary to stroke, edema  Ultram 100 q6h prn for moderate pain  Added fentanyl for severe pain  Avoid other NSAIDs, concern for  narcotics as we are watching neuro status closely. Discussed with Consuella Lose, RN  Hypertension  Home Meds:  norvasc 10, cozaar 100  BP now 180s, Stable . Permissive hypertension (OK if < 220/120) but gradually normalize in 5-7 days . BP increasing steadily throughout the day . Resumed home meds . Long-term BP goal normotensive  Hyperlipidemia  Home meds:  crestor 20  LDL 95, goal < 70  resumed home crestor in hospital  Continue statin at discharge  Diabetes type II  Home meds:  lantus 16 hs, glucophage 1000 bid  Was on insulin drip during the first night  Now on SSI & levemir 5 q12  HgbA1c 8.9, goal < 7.0  Glucoses 120-160s  Other Stroke Risk Factors  ETOH use, advised to drink no more than 1 drink(s) a day  Obesity, Body mass index is 33.55 kg/m., recommend weight loss, diet and exercise as appropriate   Hypokalemia, 2.8 - supplemented - recheck in am -> 3.0   Hospital day # 3   A/P:  Right cerebellar PICA infarct with edema and petechial hemorrhage.  No evidence of HC and no emergent need for occipital craniectomy.  I will stop the ASA due to the hemorrhage as the blood can expand and make prognosis worse.  I will add Labetalol to her Norvasc and Losartan for BP control.  She will continue on hypertonic saline and Na was only 139 today.   CT Brain repeat tomorrow.     Weston Settle, MS, MD      I spent 30 minutes of neurocritical care time in the care of this patient.     To contact Stroke Continuity provider, please refer to [email or web address]. After hours, contact General Neurology

## 2018-02-03 NOTE — Progress Notes (Signed)
Pt net neg 1800mL for yest. Discussed with Dr Nicholas LoseEshraghi, orders received.

## 2018-02-04 ENCOUNTER — Inpatient Hospital Stay (HOSPITAL_COMMUNITY): Payer: Self-pay

## 2018-02-04 LAB — GLUCOSE, CAPILLARY
GLUCOSE-CAPILLARY: 165 mg/dL — AB (ref 70–99)
Glucose-Capillary: 139 mg/dL — ABNORMAL HIGH (ref 70–99)
Glucose-Capillary: 154 mg/dL — ABNORMAL HIGH (ref 70–99)
Glucose-Capillary: 157 mg/dL — ABNORMAL HIGH (ref 70–99)

## 2018-02-04 LAB — CBC
HCT: 33.7 % — ABNORMAL LOW (ref 36.0–46.0)
HEMOGLOBIN: 11 g/dL — AB (ref 12.0–15.0)
MCH: 29.6 pg (ref 26.0–34.0)
MCHC: 32.6 g/dL (ref 30.0–36.0)
MCV: 90.8 fL (ref 80.0–100.0)
PLATELETS: 221 10*3/uL (ref 150–400)
RBC: 3.71 MIL/uL — AB (ref 3.87–5.11)
RDW: 13.1 % (ref 11.5–15.5)
WBC: 5.6 10*3/uL (ref 4.0–10.5)
nRBC: 0 % (ref 0.0–0.2)

## 2018-02-04 LAB — BASIC METABOLIC PANEL
ANION GAP: 9 (ref 5–15)
BUN: <5 mg/dL — ABNORMAL LOW (ref 6–20)
CALCIUM: 7.8 mg/dL — AB (ref 8.9–10.3)
CO2: 23 mmol/L (ref 22–32)
Chloride: 110 mmol/L (ref 98–111)
Creatinine, Ser: 0.32 mg/dL — ABNORMAL LOW (ref 0.44–1.00)
GFR calc Af Amer: >60 mL/min (ref >60)
GFR calc non Af Amer: >60 mL/min (ref >60)
Glucose, Bld: 147 mg/dL — ABNORMAL HIGH (ref 70–99)
Potassium: 3.1 mmol/L — ABNORMAL LOW (ref 3.5–5.1)
Sodium: 142 mmol/L (ref 135–145)

## 2018-02-04 LAB — SODIUM
SODIUM: 140 mmol/L (ref 135–145)
Sodium: 141 mmol/L (ref 135–145)
Sodium: 140 mmol/L (ref 135–145)

## 2018-02-04 MED ORDER — CHLORHEXIDINE GLUCONATE CLOTH 2 % EX PADS
6.0000 | MEDICATED_PAD | Freq: Every day | CUTANEOUS | Status: DC
  Administered 2018-02-04: 6 via TOPICAL

## 2018-02-04 MED ORDER — SODIUM CHLORIDE 3 % IV SOLN
INTRAVENOUS | Status: DC
  Administered 2018-02-05: 62.5 mL/h via INTRAVENOUS
  Filled 2018-02-04 (×4): qty 500

## 2018-02-04 MED ORDER — SODIUM CHLORIDE 3 % IV SOLN
INTRAVENOUS | Status: DC
  Filled 2018-02-04 (×4): qty 500

## 2018-02-04 NOTE — Progress Notes (Signed)
Physical Therapy Treatment Patient Details Name: April Fry MRN: [id number] DOB: September 25, 1960 Today's Date: 02/04/2018    History of Present Illness April Fry is a 57 y.o. female with history of hypertension, diabetes. Came to ED with n/v, room spinning, and difficulty walking. 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    PT Comments    Patient able to walk in hallway and utilize substitution for managing symptoms during ambulation.  Still with symptoms up to 7/10 and nausea, but managing without episode of emesis.  Continues to need assist for balance and orientation due to cerebellar stroke.  Feel continued skilled PT in the acute setting indicated to progress to CIR level rehab for maximized mobility/independence prior to d/c home with family support.    Follow Up Recommendations  CIR     Equipment Recommendations  Other (comment)(TBA)    Recommendations for Other Services       Precautions / Restrictions Precautions Precautions: Fall Precaution Comments: very dizzy, high fall risk Restrictions Weight Bearing Restrictions: No    Mobility  Bed Mobility Overal bed mobility: Needs Assistance Bed Mobility: Supine to Sit     Supine to sit: Min assist;HOB elevated     General bed mobility comments: assist to lift trunk  Transfers Overall transfer level: Needs assistance Equipment used: Rolling walker (2 wheeled) Transfers: Sit to/from Stand Sit to Stand: Min assist;From elevated surface Stand pivot transfers: Min assist       General transfer comment: up from EOB min a for balance/mod cues for visual target for managing dizziness symptoms; assisted on and off BSC end of session  Ambulation/Gait Ambulation/Gait assistance: Mod assist;+2 safety/equipment(for chair follow) Gait Distance (Feet): 75 Feet Assistive device: Rolling walker (2 wheeled) Gait Pattern/deviations: Decreased stride length;Shuffle;Leaning posteriorly      General Gait Details: cues for targeting throughout to limit symptoms,  Still reported up to a 7/10 with mobility and some nausea as well.  Did note increased posterior bias with target above eye level    Stairs             Wheelchair Mobility    Modified Rankin (Stroke Patients Only) Modified Rankin (Stroke Patients Only) Pre-Morbid Rankin Score: No symptoms Modified Rankin: Moderately severe disability     Balance Overall balance assessment: Needs assistance   Sitting balance-Leahy Scale: Fair     Standing balance support: Bilateral upper extremity supported Standing balance-Leahy Scale: Poor Standing balance comment: UE support and minguard at least for balance                            Cognition Arousal/Alertness: Awake/alert Behavior During Therapy: WFL for tasks assessed/performed   Area of Impairment: Following commands;Safety/judgement;Problem solving                       Following Commands: Follows multi-step commands with increased time Safety/Judgement: Decreased awareness of deficits   Problem Solving: Slow processing;Requires verbal cues General Comments: able to follow some multistep directions for management of dizziness      Exercises      General Comments General comments (skin integrity, edema, etc.): significant other/boyfriend present during session      Pertinent Vitals/Pain Pain Score: 5  Pain Location: head Pain Descriptors / Indicators: Headache Pain Intervention(s): Monitored during session;Repositioned    Home Living  Prior Function            PT Goals (current goals can now be found in the care plan section) Progress towards PT goals: Progressing toward goals    Frequency    Min 4X/week      PT Plan Current plan remains appropriate    Co-evaluation              AM-PAC PT "6 Clicks" Mobility   Outcome Measure  Help needed turning from your back to your  side while in a flat bed without using bedrails?: A Little Help needed moving from lying on your back to sitting on the side of a flat bed without using bedrails?: A Little Help needed moving to and from a bed to a chair (including a wheelchair)?: A Little Help needed standing up from a chair using your arms (e.g., wheelchair or bedside chair)?: A Little Help needed to walk in hospital room?: A Lot Help needed climbing 3-5 steps with a railing? : Total 6 Click Score: 15    End of Session Equipment Utilized During Treatment: Gait belt Activity Tolerance: Patient limited by fatigue Patient left: with call bell/phone within reach;in chair;with family/visitor present;with chair alarm set   PT Visit Diagnosis: Dizziness and giddiness (R42);Difficulty in walking, not elsewhere classified (R26.2);Other abnormalities of gait and mobility (R26.89);Other symptoms and signs involving the nervous system (R29.898)     Time: [id number] PT Time Calculation (min) (ACUTE ONLY): 23 min  Charges:  $Gait Training: 8-22 mins $Therapeutic Activity: 8-22 mins                     Sheran LawlessCyndi Matias Thurman, [place] CarolinaPT Acute Rehabilitation Services [phone] 02/04/2018    Elray Mcgregorynthia Aithan Farrelly 02/04/2018, 11:34 AM

## 2018-02-04 NOTE — Progress Notes (Signed)
INTERVAL HISTORY Family at bedside. Remains alert and oriented.  No headache.  BP is better controlled on combination of Labetalol, Norvasc, and Losartan.  Na 139 on hypertonic saline at 125 cc/hr.  Inpatient rehab has evaluated the patient.        Vitals:   02/03/18 0600 02/03/18 0700 02/03/18 0800 02/03/18 0900  BP: (!) 156/67 (!) 172/82 119/71 (!) 160/90  Pulse: 87 90 94 95  Resp: 14 14 13  (!) 23  Temp:   98.8 F (37.1 C)   TempSrc:   Oral   SpO2: 95% 95% 96% 96%  Weight:      Height:        CBC:  LastLabs        Recent Labs  Lab 01/31/18 1035  02/02/18 0637 02/03/18 0530  WBC 10.0  --  11.9* 7.6  NEUTROABS 8.5*  --   --   --   HGB 13.8   < > 12.4 12.4  HCT 42.2   < > 37.8 38.1  MCV 88.5  --  89.6 89.4  PLT 237  --  258 221   < > = values in this interval not displayed.      Basic Metabolic Panel:  LastLabs        Recent Labs  Lab 02/02/18 0637  02/03/18 0016 02/03/18 0530  NA 140   < > 140 139  K 2.8*  --   --  3.0*  CL 104  --   --  105  CO2 25  --   --  24  GLUCOSE 119*  --   --  171*  BUN <5*  --   --  <5*  CREATININE 0.38*  --   --  0.45  CALCIUM 8.2*  --   --  8.1*  MG  --   --   --  2.0   < > = values in this interval not displayed.     Lipid Panel:  Labs(Brief)     Component Value Date/Time   CHOL 158 02/01/2018 0011   CHOL 199 11/25/2017 1040   TRIG 69 02/01/2018 0011   HDL 49 02/01/2018 0011   HDL 48 11/25/2017 1040   CHOLHDL 3.2 02/01/2018 0011   VLDL 14 02/01/2018 0011   LDLCALC 95 02/01/2018 0011   LDLCALC 128 (H) 11/25/2017 1040     HgbA1c:  RecentLabs       Lab Results  Component Value Date   HGBA1C 8.9 (H) 02/01/2018     Urine Drug Screen:  Labs(Brief)          Component Value Date/Time   LABOPIA NONE DETECTED 01/31/2018 1042   COCAINSCRNUR NONE DETECTED 01/31/2018 1042   LABBENZ NONE DETECTED 01/31/2018 1042   AMPHETMU NONE DETECTED 01/31/2018 1042   THCU NONE  DETECTED 01/31/2018 1042   LABBARB NONE DETECTED 01/31/2018 1042      Alcohol Level  Labs(Brief)          Component Value Date/Time   ETH <10 01/31/2018 1034      IMAGING  Dg Chest 1 View 02/01/2018 IMPRESSION:  1. Left IJ line seen ending about the cavoatrial junction, in expected position.  2. Lungs mildly hypoexpanded. Mild vascular congestion and borderline cardiomegaly. Increased interstitial markings may reflect mild interstitial edema.    Ct Head Wo Contrast 02/02/2018 IMPRESSION:  Progressive edema and mass-effect due to right PICA infarct. No hemorrhage Progressive low density in the left brachium pontis which may be edema. No infarct in  this area on recent MRI.    Ct Head Wo Contrast 02/02/2018 IMPRESSION:  Evolving RIGHT PICA territory infarct with worsening edema (potentially cytotoxic) and minimal suspected petechial edema. No obstructive hydrocephalus though increasingly effaced foramen magnum at risk for hydrocephalus.    Ct Head Wo Contrast 02/01/2018 IMPRESSION: Slight progression of low-density and swelling within the inferior cerebellum on the right consistent with posterior inferior cerebellar artery territory infarction. Right cerebellar tonsil protrudes through the foramen magnum. Mass-effect upon the fourth ventricle but no sign obstruction or change in ventricular size. No sign of bleeding.   Mr Brain Wo Contrast 02/03/2018 IMPRESSION:  1. Evolving acute RIGHT cerebellar PICA territory infarct with petechial hemorrhage. Partially effaced fourth ventricle without obstructive hydrocephalus.  2. No LEFT brachium pontis abnormality.    Carotid Doppler   There is 1-39% bilateral ICA stenosis. Vertebral artery flow is antegrade.   2D echocardiogram - Left ventricle: The cavity size was normal. There was mildconcentric hypertrophy. Systolic function was normal. Theestimated ejection fraction was in the range of 55% to 60%. Wallmotion  was normal; there were no regional wall motionabnormalities. Doppler parameters are consistent with abnormalleft ventricular relaxation (grade 1 diastolic dysfunction).Doppler parameters are consistent with indeterminate ventricularfilling pressure. - Aorta: Ascending aortic diameter: 38 mm (S). - Ascending aorta: The ascending aorta was mildly dilated. - Mitral valve: Transvalvular velocity was within the normal range.There was no evidence for stenosis. There was trivial regurgitation. - Right ventricle: The cavity size was normal. Wall thickness wasnormal. Systolic function was normal. - Tricuspid valve: There was trivial regurgitation. - Pulmonary arteries: Systolic pressure was within the normalrange. PA peak pressure: 32 mm Hg (S).   Exam: No change in exam today NAD, pleasant                  Speech:    Speech is normal; fluent and spontaneous with normal comprehension.  Cognition:    The patient is oriented to person, place, and time;     recent and remote memory intact;     language fluent;    Cranial Nerves:    The pupils are equal, round, and reactive to light.Trigeminal sensation is intact and the muscles of mastication are normal. The face is symmetric. The palate elevates in the midline. Hearing intact. Voice is normal. Shoulder shrug is normal. The tongue has normal motion without fasciculations.   Coordination:  No discernible dysmetria or dysdiadokokinesia on the right.  Motor Observation:    No asymmetry, no atrophy, and no involuntary movements noted. Tone:    Normal muscle tone.     Strength:    Strength is V/V in the upper and lower limbs.      Sensation: intact to LT   ASSESSMENT/PLAN Ms. April RingerMaria Fry is a 57 y.o. female with history of HTN and diabetes presenting with nausea, vomiting, dizziness and unsteady gait.   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 Dutch QuintPoole.  While there is increasing edema, not a candidate for surgery 12/24. Will follow.   CCM consulted to assist with medical management  CT head 12/23 1119 large R cerebral infarct R PICA territory  CTA head & neck 12/23 1241 R inferior cerebellar infarct  MRI 12/23 1554 mod R PICA cerebellar infarct w/ cytotoxic edema and petechial hmg. Mild effacement 4th ventricle. Mild Small  vessel disease.   CT head 12/23 2055 evolving R PICA infarct w/o HT. Regional mass effect w/o hydrocephalus.   CT head 12/24 1004  Slight progression of stroke and selling inferior cerebellum territory.  Right cerebellar tonsil protruding through the foramen magnum.  Mass-effect on fourth ventricle but no sign of obstruction or increase in ventricular size.  No bleeding  CT 12/25 0417 evolving R PICA infarct w/ worsening edema and minimal suspected petechial edema. No obstructive hydrocephalus but increasingly effaced foramen magnum   carotid Doppler  B ICA 1-39% stenosis, VAs antegrade   Repeat CT 12/25 1600  2D Echo  EF 55-60%. No source of embolus   Consider TEE and loop to further evaluate for embolic source once patient stable  Consider hypercoagulable labs for workup in young  LDL 95  HgbA1c 8.9  HIV pending   RPR pending  SCDs for VTE prophylaxis  Patient passed swallowing.  We will start clear liquid diet in the event surgical intervention is indicated. As progresses, consider solid foods  No antithrombotic prior to admission, now on aspirin 81 mg daily . Continue aspirin.  Therapy recommendations:  CIR. Consult placed  Disposition:  pending   Cerebral edema Induced Hypernatremia  High risk for continued cerebral edema  CT with ongoing edema  Dr. Jordan Likes following  Na dropped throughout the day yesterday. Rate increased to 100 during the night  Na now  140  Continue 3% @ 100/hr  Goal Na 150-155  Check Na q 6h  central line placed 12/24  Headache  Secondary to stroke, edema  Ultram 100 q6h prn for moderate pain  Added fentanyl for severe pain  Avoid other NSAIDs, concern for narcotics as we are watching neuro status closely. Discussed with Consuella Lose, RN  Hypertension  Home Meds:  norvasc 10, cozaar 100  BP now 180s, Stable  Permissive hypertension (OK if < 220/120) but gradually normalize in 5-7 days  BP increasing steadily throughout the day  Resumed home meds  Long-term BP goal normotensive  Hyperlipidemia  Home meds:  crestor 20  LDL 95, goal < 70  resumed home crestor in hospital  Continue statin at discharge  Diabetes type II  Home meds:  lantus 16 hs, glucophage 1000 bid  Was on insulin drip during the first night  Now on SSI & levemir 5 q12  HgbA1c 8.9, goal < 7.0  Glucoses 120-160s  Other Stroke Risk Factors  ETOH use, advised to drink no more than 1 drink(s) a day  Obesity, Body mass index is 33.55 kg/m., recommend weight loss, diet and exercise as appropriate   Hypokalemia, 2.8 - supplemented - recheck in am -> 3.0   Hospital day # 3   A/P:  Right cerebellar PICA infarct with edema and petechial hemorrhage. I will repeat CT Brain today to assess edema and petechial hemorrhage status.  If stable or improved as I expect, I will wean her off the hypertonic saline.   No emergent need for occipital craniectomy.  I may consider re-starting  the ASA depending on what the CT looks like.   Continue Labetalol, Norvasc, and Losartan for BP control.      Weston Settle, MS, MD

## 2018-02-04 NOTE — Progress Notes (Signed)
Reviewed CT Brain - no major vs 2 days ago.  No hemorrhagic transformation.  No hydrocephalus.  She is past the point of maximal edema.  I will wean her off the hypertonic saline in anticipation of transfer to inpatient rehab.    Weston SettleShervin Fleda Pagel, MS, MD

## 2018-02-04 NOTE — Progress Notes (Signed)
NAME:  April RingerMaria Arana-Torres, MRN:  [id number], DOB:  12-14-1960, LOS: 4 ADMISSION DATE:  01/31/2018, CONSULTATION DATE:  02/02/2018 REFERRING MD:  Dr. Lucia GaskinsAhern, Neurology, CHIEF COMPLAINT:  Difficulty walking   Brief History   57 yo female with nausea, vomiting, vertigo from Rt cerebellar hemisphere infarct.    Past Medical History  DM, HTN, PNA  Significant Hospital Events   12/23 admit 12/24 start 3% saline  Consults:  Neurosurgery  Procedures:  Lt IJ CVL 12/24 >>   Significant Diagnostic Tests:  CT angio head/neck 12/23 >> Rt inferior cerebellar infarct MRI brain 12/23 >> moderate acute Rt PICA cerebellar infarct with cytotoxic edema Echo 12/24 >> EF 55 to 60%, grade 1 DD, PAS 32 mmHg, ascending aorta 38 mm CT head 12/25 >> worsening cytotoxic edema  Micro Data:    Antimicrobials:    Interim history/subjective:  Not having double vision anymore.  Headache better.  Sat in chair yesterday.  Objective   Blood pressure (!) 152/63, pulse 78, temperature 97.8 F (36.6 C), temperature source Oral, resp. rate 14, height 5\' 2"  (1.575 m), weight 83.2 kg, SpO2 96 %.        Intake/Output Summary (Last 24 hours) at 02/04/2018 1007 Last data filed at 02/04/2018 0900 Gross per 24 hour  Intake 2787.18 ml  Output 3975 ml  Net -1187.82 ml   Filed Weights   01/31/18 1700  Weight: 83.2 kg    Examination:  General - alert Eyes - pupils reactive ENT - no sinus tenderness, no stridor Cardiac - regular rate/rhythm, no murmur Chest - equal breath sounds b/l, no wheezing or rales Abdomen - soft, non tender, + bowel sounds Extremities - no cyanosis, clubbing, or edema Skin - no rashes Neuro - normal strength, moves extremities, follows commands Psych - normal mood and behavior   Resolved Hospital Problem list     Assessment & Plan:   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 Plan - for CT head 12/27 >> if better, then neuro planning to wean off 3%  saline - continue ASA  Hypertension, HLD. Plan - goal SBP < 180 - continue norvasc, cozaar, crestor  DM type II. Plan - SSI with levemir - hold outpt metformin  Hypokalemia. Plan - replace K  Best practice:  Diet: clear liquid DVT prophylaxis: SCD GI prophylaxis: not indicated Mobility: bed rest Code Status: full code Family Communication: updated husband at bedside  PCCM will sign off.  If additional medical assistance needed, then recommend consulting hospitalist team.  Labs    CMP Latest Ref Rng & Units 02/04/2018 02/04/2018 02/03/2018  Glucose 70 - 99 mg/dL 409(W147(H) - -  BUN 6 - 20 mg/dL <1(X<5(L) - -  Creatinine 9.140.44 - 1.00 mg/dL 7.82(N0.32(L) - -  Sodium 562135 - 145 mmol/L 142 141 139  Potassium 3.5 - 5.1 mmol/L 3.1(L) - -  Chloride 98 - 111 mmol/L 110 - -  CO2 22 - 32 mmol/L 23 - -  Calcium 8.9 - 10.3 mg/dL 7.8(L) - -  Total Protein 6.5 - 8.1 g/dL - - -  Total Bilirubin 0.3 - 1.2 mg/dL - - -  Alkaline Phos 38 - 126 U/L - - -  AST 15 - 41 U/L - - -  ALT 0 - 44 U/L - - -   CBC Latest Ref Rng & Units 02/04/2018 02/03/2018 02/02/2018  WBC 4.0 - 10.5 K/uL 5.6 7.6 11.9(H)  Hemoglobin 12.0 - 15.0 g/dL 11.0(L) 12.4 12.4  Hematocrit 36.0 - 46.0 %  33.7(L) 38.1 37.8  Platelets 150 - 400 K/uL 221 221 258   CBG (last 3)  Recent Labs    02/03/18 1625 02/03/18 2112 02/04/18 0755  GLUCAP 165* 153* 139*    Coralyn HellingVineet Baylei Siebels, MD [facility] 02/04/2018, 10:07 AM

## 2018-02-05 DIAGNOSIS — I161 Hypertensive emergency: Secondary | ICD-10-CM

## 2018-02-05 LAB — CBC
HCT: 36.1 % (ref 36.0–46.0)
Hemoglobin: 11.8 g/dL — ABNORMAL LOW (ref 12.0–15.0)
MCH: 29.4 pg (ref 26.0–34.0)
MCHC: 32.7 g/dL (ref 30.0–36.0)
MCV: 90 fL (ref 80.0–100.0)
Platelets: 254 10*3/uL (ref 150–400)
RBC: 4.01 MIL/uL (ref 3.87–5.11)
RDW: 13.1 % (ref 11.5–15.5)
WBC: 5.9 10*3/uL (ref 4.0–10.5)
nRBC: 0 % (ref 0.0–0.2)

## 2018-02-05 LAB — BASIC METABOLIC PANEL
Anion gap: 10 (ref 5–15)
CO2: 24 mmol/L (ref 22–32)
Calcium: 8.3 mg/dL — ABNORMAL LOW (ref 8.9–10.3)
Chloride: 107 mmol/L (ref 98–111)
Creatinine, Ser: 0.42 mg/dL — ABNORMAL LOW (ref 0.44–1.00)
GFR calc Af Amer: >60 mL/min (ref >60)
GFR calc non Af Amer: >60 mL/min (ref >60)
Glucose, Bld: 137 mg/dL — ABNORMAL HIGH (ref 70–99)
Potassium: 3.3 mmol/L — ABNORMAL LOW (ref 3.5–5.1)
Sodium: 141 mmol/L (ref 135–145)

## 2018-02-05 LAB — GLUCOSE, CAPILLARY
GLUCOSE-CAPILLARY: 194 mg/dL — AB (ref 70–99)
Glucose-Capillary: 218 mg/dL — ABNORMAL HIGH (ref 70–99)
Glucose-Capillary: 167 mg/dL — ABNORMAL HIGH (ref 70–99)

## 2018-02-05 LAB — SODIUM
Sodium: 140 mmol/L (ref 135–145)
Sodium: 138 mmol/L (ref 135–145)
Sodium: 138 mmol/L (ref 135–145)

## 2018-02-05 LAB — MAGNESIUM: Magnesium: 1.9 mg/dL (ref 1.7–2.4)

## 2018-02-05 MED ORDER — LOSARTAN POTASSIUM 50 MG PO TABS
50.0000 mg | ORAL_TABLET | Freq: Two times a day (BID) | ORAL | Status: DC
  Administered 2018-02-05 – 2018-02-07 (×6): 50 mg via ORAL
  Filled 2018-02-05 (×6): qty 1

## 2018-02-05 MED ORDER — INSULIN GLARGINE 100 UNIT/ML ~~LOC~~ SOLN
10.0000 [IU] | Freq: Every day | SUBCUTANEOUS | Status: DC
  Administered 2018-02-05: 10 [IU] via SUBCUTANEOUS
  Filled 2018-02-05: qty 0.1

## 2018-02-05 MED ORDER — POTASSIUM CHLORIDE CRYS ER 20 MEQ PO TBCR
40.0000 meq | EXTENDED_RELEASE_TABLET | ORAL | Status: AC
  Administered 2018-02-05 – 2018-02-06 (×2): 40 meq via ORAL
  Filled 2018-02-05 (×2): qty 2

## 2018-02-05 MED ORDER — LABETALOL HCL 200 MG PO TABS
200.0000 mg | ORAL_TABLET | Freq: Three times a day (TID) | ORAL | Status: DC
  Administered 2018-02-05 – 2018-02-07 (×9): 200 mg via ORAL
  Filled 2018-02-05 (×2): qty 2
  Filled 2018-02-05: qty 1
  Filled 2018-02-05 (×2): qty 2
  Filled 2018-02-05 (×4): qty 1

## 2018-02-05 MED ORDER — ASPIRIN EC 325 MG PO TBEC
325.0000 mg | DELAYED_RELEASE_TABLET | Freq: Every day | ORAL | Status: DC
  Administered 2018-02-05 – 2018-02-07 (×3): 325 mg via ORAL
  Filled 2018-02-05 (×3): qty 1

## 2018-02-05 MED ORDER — BUTALBITAL-APAP-CAFFEINE 50-325-40 MG PO TABS: 1.0000 | ORAL_TABLET | Freq: Two times a day (BID) | ORAL | Status: DC | PRN

## 2018-02-05 NOTE — Progress Notes (Signed)
SLP Cancellation Note  Patient Details Name: April Fry MRN: [id number] DOB: [birth year]   Cancelled treatment:       Reason Eval/Treat Not Completed: SLP screened, no needs identified, will sign off. Received new orders for swallow eval, but pt passed [place] several days ago and has been tolerating diet well per RN without any recent change in status. Pt actually improving. Suspect these orders were entered in error related to plan for NPO status on Sunday for TEE. Will discontinue orders and sign off. Please let me know if you need me to return.   Harlon DittyBonnie Bekki Tavenner, MA CCC-SLP  Acute Rehabilitation Services Pager [phone] Office [phone]  Claudine MoutonDeBlois, Leocadio Heal Caroline 02/05/2018, 1:12 PM

## 2018-02-05 NOTE — Progress Notes (Signed)
3% saline infusion stopped

## 2018-02-05 NOTE — Progress Notes (Signed)
Physical Therapy Treatment Patient Details Name: April Fry MRN: [id number] DOB: 06/30/1960 Today's Date: 02/05/2018    History of Present Illness April Fry is a 57 y.o. female with history of hypertension, diabetes. Came to ED with n/v, room spinning, and difficulty walking. 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    PT Comments    Pt progressing well towards all goals. Pt denied any dizziness or nausea t/o entire session. Pt in much better spirits and determined to get better and is eager to get to rehab. Pt cont with impaired balance and coordination requiring assist for safe mobility.    Follow Up Recommendations  CIR     Equipment Recommendations  Other (comment)    Recommendations for Other Services Rehab consult     Precautions / Restrictions Precautions Precautions: Fall Restrictions Weight Bearing Restrictions: No    Mobility  Bed Mobility Overal bed mobility: Needs Assistance Bed Mobility: Supine to Sit Rolling: Min guard Sidelying to sit: Min guard       General bed mobility comments: no physical assist needed, denies onset of dizziness or nausea upon sitting up  Transfers Overall transfer level: Needs assistance Equipment used: Rolling walker (2 wheeled) Transfers: Sit to/from Stand Sit to Stand: Min guard         General transfer comment: verbal cues for hand placement, denies dizziness upon standing  Ambulation/Gait Ambulation/Gait assistance: Min assist;Mod assist Gait Distance (Feet): 200 Feet Assistive device: Rolling walker (2 wheeled);1 person hand held assist;None Gait Pattern/deviations: Step-through pattern;Decreased stride length;Staggering right;Drifts right/left;Wide base of support Gait velocity: decreased Gait velocity interpretation: <1.8 ft/sec, indicate of risk for recurrent falls General Gait Details: pt began amb with RW requiring min guard denies dizziness, transitioned to no AD, pt  unsteady with L/R staggering, shorter steps and decreased step height length, pt with onset of fatigue and given HHA on the R and stability improved,    Stairs             Wheelchair Mobility    Modified Rankin (Stroke Patients Only) Modified Rankin (Stroke Patients Only) Pre-Morbid Rankin Score: No symptoms Modified Rankin: Moderate disability     Balance Overall balance assessment: Needs assistance Sitting-balance support: Feet supported;Bilateral upper extremity supported Sitting balance-Leahy Scale: Fair Sitting balance - Comments: min guard to close supervision at EOB and on BSC   Standing balance support: Bilateral upper extremity supported Standing balance-Leahy Scale: Poor Standing balance comment: UE support and minguard at least for balance                            Cognition Arousal/Alertness: Awake/alert Behavior During Therapy: WFL for tasks assessed/performed Overall Cognitive Status: Within Functional Limits for tasks assessed                                 General Comments: pt much improved from eval, pt with improved spirits and eager to get better      Exercises      General Comments General comments (skin integrity, edema, etc.): VSS, denied dizziness and nausea t/o the entire session      Pertinent Vitals/Pain Pain Assessment: 0-10 Pain Score: 2  Pain Location: head Pain Descriptors / Indicators: Headache Pain Intervention(s): Monitored during session    Home Living  Prior Function            PT Goals (current goals can now be found in the care plan section) Progress towards PT goals: Progressing toward goals    Frequency    Min 4X/week      PT Plan Current plan remains appropriate    Co-evaluation              AM-PAC PT "6 Clicks" Mobility   Outcome Measure  Help needed turning from your back to your side while in a flat bed without using bedrails?: A  Little Help needed moving from lying on your back to sitting on the side of a flat bed without using bedrails?: A Little Help needed moving to and from a bed to a chair (including a wheelchair)?: A Little Help needed standing up from a chair using your arms (e.g., wheelchair or bedside chair)?: A Little Help needed to walk in hospital room?: A Lot Help needed climbing 3-5 steps with a railing? : A Lot 6 Click Score: 16    End of Session Equipment Utilized During Treatment: Gait belt Activity Tolerance: Patient limited by fatigue Patient left: with call bell/phone within reach;in chair;with family/visitor present;with chair alarm set Nurse Communication: Mobility status PT Visit Diagnosis: Difficulty in walking, not elsewhere classified (R26.2);Other abnormalities of gait and mobility (R26.89);Other symptoms and signs involving the nervous system (R29.898)     Time: [id number] PT Time Calculation (min) (ACUTE ONLY): 23 min  Charges:  $Gait Training: 23-37 mins                     Lewis ShockAshly Kiyo Heal, PT, DPT Acute Rehabilitation Services Pager #: [phone] Office #: [phone]    [name] 02/05/2018, 1:31 PM

## 2018-02-05 NOTE — Progress Notes (Signed)
RN received MD order to discontinue 3% saline. RN will begin to taper infusion which is currently running at 16625ml/hr and will change the rate to 62.165ml/hr for 12 hours.

## 2018-02-05 NOTE — Progress Notes (Signed)
Stroke neurology progress note  INTERVAL HISTORY Patient lying in bed, her daughter at bedside as interpreter.  Patient denies headache, nausea or vomiting.  Repeat CT yesterday showed stable infarct and cerebral edema, no hydrocephalus.  3% saline now tapering to 30 cc/h, plan to discontinue this afternoon.        Vitals:   02/03/18 0600 02/03/18 0700 02/03/18 0800 02/03/18 0900  BP: (!) 156/67 (!) 172/82 119/71 (!) 160/90  Pulse: 87 90 94 95  Resp: 14 14 13  (!) 23  Temp:   98.8 F (37.1 C)   TempSrc:   Oral   SpO2: 95% 95% 96% 96%  Weight:      Height:        CBC:  LastLabs        Recent Labs  Lab 01/31/18 1035  02/02/18 0637 02/03/18 0530  WBC 10.0  --  11.9* 7.6  NEUTROABS 8.5*  --   --   --   HGB 13.8   < > 12.4 12.4  HCT 42.2   < > 37.8 38.1  MCV 88.5  --  89.6 89.4  PLT 237  --  258 221   < > = values in this interval not displayed.      Basic Metabolic Panel:  LastLabs        Recent Labs  Lab 02/02/18 0637  02/03/18 0016 02/03/18 0530  NA 140   < > 140 139  K 2.8*  --   --  3.0*  CL 104  --   --  105  CO2 25  --   --  24  GLUCOSE 119*  --   --  171*  BUN <5*  --   --  <5*  CREATININE 0.38*  --   --  0.45  CALCIUM 8.2*  --   --  8.1*  MG  --   --   --  2.0   < > = values in this interval not displayed.     Lipid Panel:  Labs(Brief)          Component Value Date/Time   CHOL 158 02/01/2018 0011   CHOL 199 11/25/2017 1040   TRIG 69 02/01/2018 0011   HDL 49 02/01/2018 0011   HDL 48 11/25/2017 1040   CHOLHDL 3.2 02/01/2018 0011   VLDL 14 02/01/2018 0011   LDLCALC 95 02/01/2018 0011   LDLCALC 128 (H) 11/25/2017 1040     HgbA1c:  RecentLabs       Lab Results  Component Value Date   HGBA1C 8.9 (H) 02/01/2018     Urine Drug Screen:  Labs(Brief)          Component Value Date/Time   LABOPIA NONE DETECTED 01/31/2018 1042   COCAINSCRNUR NONE DETECTED 01/31/2018 1042   LABBENZ NONE DETECTED  01/31/2018 1042   AMPHETMU NONE DETECTED 01/31/2018 1042   THCU NONE DETECTED 01/31/2018 1042   LABBARB NONE DETECTED 01/31/2018 1042      Alcohol Level  Labs(Brief)          Component Value Date/Time   ETH <10 01/31/2018 1034      IMAGING  Dg Chest 1 View 02/01/2018 IMPRESSION:  1. Left IJ line seen ending about the cavoatrial junction, in expected position.  2. Lungs mildly hypoexpanded. Mild vascular congestion and borderline cardiomegaly. Increased interstitial markings may reflect mild interstitial edema.    Ct Head Wo Contrast 02/02/2018 IMPRESSION:  Progressive edema and mass-effect due to right PICA infarct. No hemorrhage Progressive  low density in the left brachium pontis which may be edema. No infarct in this area on recent MRI.    Ct Head Wo Contrast 02/02/2018 IMPRESSION:  Evolving RIGHT PICA territory infarct with worsening edema (potentially cytotoxic) and minimal suspected petechial edema. No obstructive hydrocephalus though increasingly effaced foramen magnum at risk for hydrocephalus.    Ct Head Wo Contrast 02/01/2018 IMPRESSION: Slight progression of low-density and swelling within the inferior cerebellum on the right consistent with posterior inferior cerebellar artery territory infarction. Right cerebellar tonsil protrudes through the foramen magnum. Mass-effect upon the fourth ventricle but no sign obstruction or change in ventricular size. No sign of bleeding.   Mr Brain Wo Contrast 02/03/2018 IMPRESSION:  1. Evolving acute RIGHT cerebellar PICA territory infarct with petechial hemorrhage. Partially effaced fourth ventricle without obstructive hydrocephalus.  2. No LEFT brachium pontis abnormality.   Carotid Doppler   There is 1-39% bilateral ICA stenosis. Vertebral artery flow is antegrade.   2D echocardiogram - Left ventricle: The cavity size was normal. There was mildconcentric hypertrophy. Systolic function was normal.  Theestimated ejection fraction was in the range of 55% to 60%. Wallmotion was normal; there were no regional wall motionabnormalities. Doppler parameters are consistent with abnormalleft ventricular relaxation (grade 1 diastolic dysfunction).Doppler parameters are consistent with indeterminate ventricularfilling pressure. - Aorta: Ascending aortic diameter: 38 mm (S). - Ascending aorta: The ascending aorta was mildly dilated. - Mitral valve: Transvalvular velocity was within the normal range.There was no evidence for stenosis. There was trivial regurgitation. - Right ventricle: The cavity size was normal. Wall thickness wasnormal. Systolic function was normal. - Tricuspid valve: There was trivial regurgitation. - Pulmonary arteries: Systolic pressure was within the normalrange. PA peak pressure: 32 mm Hg (S).    Physical Exam:  No change in exam today NAD, pleasant                  Speech:    Speech is normal; fluent and spontaneous with normal comprehension.  Cognition:    The patient is oriented to person, place, and time;     recent and remote memory intact;     language fluent;    Cranial Nerves:    The pupils are equal, round, and reactive to light.Trigeminal sensation is intact and the muscles of mastication are normal. The face is symmetric. The palate elevates in the midline. Hearing intact. Voice is normal. Shoulder shrug is normal. The tongue has normal motion without fasciculations.   Coordination: Intact bilaterally  Motor Observation:    No asymmetry, no atrophy, and no involuntary movements noted. Tone:    Normal muscle tone.     Strength:    Strength is V/V in the upper and lower limbs.      Sensation: intact to LT   ASSESSMENT/PLAN Ms. April Fry is a 57 y.o. female with history of HTN and diabetes presenting with nausea, vomiting, dizziness and unsteady gait.   Stroke:  Moderate right PICA cerebellar infarct with cerebral edema and mild  hydrocephalus, embolic secondary to unknown source  CT head 01/31/18 large R cerebral infarct R PICA territory  CTA head & neck unremarkable   MRI 01/31/18 mod R PICA cerebellar infarct w/ cytotoxic edema and petechial hmg. Mild effacement 4th ventricle.    Serial CT head x 5 evolving R PICA infarct w/o HT. Regional mass effect w/o hydrocephalus.   Repeat MRI right PICA infarct with fourth ventricle effacement, no hydrocephalus  carotid Doppler  B ICA 1-39% stenosis, VAs antegrade  2D Echo  EF 55-60%. No source of embolus   Recommend TEE and loop to further evaluate for embolic source   LDL 95  HgbA1c 8.9  HIV/RPR negative  SCDs for VTE prophylaxis  No antithrombotic prior to admission, now on aspirin 325 mg daily.   Therapy recommendations:  CIR  Disposition:  pending   Cerebral edema Induced Hypernatremia  CT with ongoing edema  Taper 3% saline  Plan to be off 3% saline this afternoon  Na 141->138  central line can be removed tomorrow  Hypertension  Home Meds:  norvasc 10, cozaar 100  BP now Stable  On amlodipine 10, labetalol 200 tid, losartan 50 bid  Long-term BP goal normotensive  Hyperlipidemia  Home meds:  crestor 20  LDL 95, goal < 70  resumed home crestor in hospital  Continue statin at discharge  Diabetes type II  HgbA1c 8.9, goal < 7.0  Home meds:  lantus 16 hs, glucophage 1000 bid  Hyperglycemia   SSI  On lantus 10 qhs  Other Stroke Risk Factors  ETOH use, advised to drink no more than 1 drink(s) a day  Obesity, Body mass index is 33.55 kg/m., recommend weight loss, diet and exercise as appropriate   Hypokalemia, 2.8-> 3.0->3.3 - supplement   Hospital day # 3  This patient is critically ill due to right large cerebellar stroke, hypertensive emergency, hyperglycemia and at significant risk of neurological worsening, death form recurrent stroke, hemorrhagic conversion, hydrocephalus, DKA, heart failure. This  patient's care requires constant monitoring of vital signs, hemodynamics, respiratory and cardiac monitoring, review of multiple databases, neurological assessment, discussion with family, other specialists and medical decision making of high complexity. I spent 35 minutes of neurocritical care time in the care of this patient.  Marvel PlanJindong Labrea Eccleston, MD PhD Stroke Neurology 02/05/2018 8:43 PM

## 2018-02-05 NOTE — Progress Notes (Signed)
  NEUROSURGERY PROGRESS NOTE   No issues overnight.   EXAM:  BP (!) 173/63   Pulse 76   Temp 99.1 F (37.3 C) (Oral)   Resp 16   Ht 5\' 2"  (1.575 m)   Wt 83.2 kg   SpO2 97%   BMI 33.55 kg/m   Awake, alert, oriented  Speech fluent, appropriate in spanish CN grossly intact  MAE well  IMPRESSION:  57 y.o. female s/p R PICA stroke, no HCP  PLAN: - No need for NS intervention, plan on CIR - Please call with questions

## 2018-02-05 NOTE — Plan of Care (Signed)
  Problem: Self-Care: Goal: Ability to communicate needs accurately will improve Outcome: Completed/Met   

## 2018-02-06 DIAGNOSIS — Z452 Encounter for adjustment and management of vascular access device: Secondary | ICD-10-CM

## 2018-02-06 LAB — BASIC METABOLIC PANEL
Anion gap: 7 (ref 5–15)
BUN: 7 mg/dL (ref 6–20)
CO2: 27 mmol/L (ref 22–32)
Calcium: 8.8 mg/dL — ABNORMAL LOW (ref 8.9–10.3)
Chloride: 103 mmol/L (ref 98–111)
Creatinine, Ser: 0.53 mg/dL (ref 0.44–1.00)
GFR calc Af Amer: >60 mL/min (ref >60)
Glucose, Bld: 160 mg/dL — ABNORMAL HIGH (ref 70–99)
Potassium: 5 mmol/L (ref 3.5–5.1)
Sodium: 137 mmol/L (ref 135–145)

## 2018-02-06 LAB — GLUCOSE, CAPILLARY
Glucose-Capillary: 162 mg/dL — ABNORMAL HIGH (ref 70–99)
Glucose-Capillary: 181 mg/dL — ABNORMAL HIGH (ref 70–99)
Glucose-Capillary: 236 mg/dL — ABNORMAL HIGH (ref 70–99)
Glucose-Capillary: 182 mg/dL — ABNORMAL HIGH (ref 70–99)

## 2018-02-06 LAB — CBC
HCT: 37 % (ref 36.0–46.0)
Hemoglobin: 12.2 g/dL (ref 12.0–15.0)
MCH: 28.9 pg (ref 26.0–34.0)
MCHC: 33 g/dL (ref 30.0–36.0)
MCV: 87.7 fL (ref 80.0–100.0)
PLATELETS: 220 10*3/uL (ref 150–400)
RBC: 4.22 MIL/uL (ref 3.87–5.11)
RDW: 12.9 % (ref 11.5–15.5)
WBC: 6.9 10*3/uL (ref 4.0–10.5)
nRBC: 0 % (ref 0.0–0.2)

## 2018-02-06 MED ORDER — INSULIN GLARGINE 100 UNIT/ML ~~LOC~~ SOLN
16.0000 [IU] | Freq: Every day | SUBCUTANEOUS | Status: DC
  Administered 2018-02-06 – 2018-02-07 (×2): 16 [IU] via SUBCUTANEOUS
  Filled 2018-02-06 (×4): qty 0.16

## 2018-02-06 NOTE — H&P (View-Only) (Signed)
Stroke neurology progress note  INTERVAL HISTORY Patient lying in bed, her daughter at bedside as interpreter.  Patient denies headache, nausea or vomiting.  Off 3% saline now. PT/OT recommend CIR        Vitals:   02/03/18 0600 02/03/18 0700 02/03/18 0800 02/03/18 0900  BP: (!) 156/67 (!) 172/82 119/71 (!) 160/90  Pulse: 87 90 94 95  Resp: 14 14 13  (!) 23  Temp:   98.8 F (37.1 C)   TempSrc:   Oral   SpO2: 95% 95% 96% 96%  Weight:      Height:        CBC:  LastLabs        Recent Labs  Lab 01/31/18 1035  02/02/18 0637 02/03/18 0530  WBC 10.0  --  11.9* 7.6  NEUTROABS 8.5*  --   --   --   HGB 13.8   < > 12.4 12.4  HCT 42.2   < > 37.8 38.1  MCV 88.5  --  89.6 89.4  PLT 237  --  258 221   < > = values in this interval not displayed.      Basic Metabolic Panel:  LastLabs        Recent Labs  Lab 02/02/18 0637  02/03/18 0016 02/03/18 0530  NA 140   < > 140 139  K 2.8*  --   --  3.0*  CL 104  --   --  105  CO2 25  --   --  24  GLUCOSE 119*  --   --  171*  BUN <5*  --   --  <5*  CREATININE 0.38*  --   --  0.45  CALCIUM 8.2*  --   --  8.1*  MG  --   --   --  2.0   < > = values in this interval not displayed.     Lipid Panel:  Labs(Brief)          Component Value Date/Time   CHOL 158 02/01/2018 0011   CHOL 199 11/25/2017 1040   TRIG 69 02/01/2018 0011   HDL 49 02/01/2018 0011   HDL 48 11/25/2017 1040   CHOLHDL 3.2 02/01/2018 0011   VLDL 14 02/01/2018 0011   LDLCALC 95 02/01/2018 0011   LDLCALC 128 (H) 11/25/2017 1040     HgbA1c:  RecentLabs       Lab Results  Component Value Date   HGBA1C 8.9 (H) 02/01/2018     Urine Drug Screen:  Labs(Brief)          Component Value Date/Time   LABOPIA NONE DETECTED 01/31/2018 1042   COCAINSCRNUR NONE DETECTED 01/31/2018 1042   LABBENZ NONE DETECTED 01/31/2018 1042   AMPHETMU NONE DETECTED 01/31/2018 1042   THCU NONE DETECTED 01/31/2018 1042   LABBARB NONE  DETECTED 01/31/2018 1042      Alcohol Level  Labs(Brief)          Component Value Date/Time   ETH <10 01/31/2018 1034      IMAGING  Dg Chest 1 View 02/01/2018 IMPRESSION:  1. Left IJ line seen ending about the cavoatrial junction, in expected position.  2. Lungs mildly hypoexpanded. Mild vascular congestion and borderline cardiomegaly. Increased interstitial markings may reflect mild interstitial edema.    Ct Head Wo Contrast 02/02/2018 IMPRESSION:  Progressive edema and mass-effect due to right PICA infarct. No hemorrhage Progressive low density in the left brachium pontis which may be edema. No infarct in this area on  recent MRI.    Ct Head Wo Contrast 02/02/2018 IMPRESSION:  Evolving RIGHT PICA territory infarct with worsening edema (potentially cytotoxic) and minimal suspected petechial edema. No obstructive hydrocephalus though increasingly effaced foramen magnum at risk for hydrocephalus.    Ct Head Wo Contrast 02/01/2018 IMPRESSION: Slight progression of low-density and swelling within the inferior cerebellum on the right consistent with posterior inferior cerebellar artery territory infarction. Right cerebellar tonsil protrudes through the foramen magnum. Mass-effect upon the fourth ventricle but no sign obstruction or change in ventricular size. No sign of bleeding.   Mr Brain Wo Contrast 02/03/2018 IMPRESSION:  1. Evolving acute RIGHT cerebellar PICA territory infarct with petechial hemorrhage. Partially effaced fourth ventricle without obstructive hydrocephalus.  2. No LEFT brachium pontis abnormality.   Carotid Doppler   There is 1-39% bilateral ICA stenosis. Vertebral artery flow is antegrade.   2D echocardiogram - Left ventricle: The cavity size was normal. There was mildconcentric hypertrophy. Systolic function was normal. Theestimated ejection fraction was in the range of 55% to 60%. Wallmotion was normal; there were no regional wall  motionabnormalities. Doppler parameters are consistent with abnormalleft ventricular relaxation (grade 1 diastolic dysfunction).Doppler parameters are consistent with indeterminate ventricularfilling pressure. - Aorta: Ascending aortic diameter: 38 mm (S). - Ascending aorta: The ascending aorta was mildly dilated. - Mitral valve: Transvalvular velocity was within the normal range.There was no evidence for stenosis. There was trivial regurgitation. - Right ventricle: The cavity size was normal. Wall thickness wasnormal. Systolic function was normal. - Tricuspid valve: There was trivial regurgitation. - Pulmonary arteries: Systolic pressure was within the normalrange. PA peak pressure: 32 mm Hg (S).    Physical Exam:  No change in exam today NAD, pleasant                  Speech:    Speech is normal; fluent and spontaneous with normal comprehension.  Cognition:    The patient is oriented to person, place, and time;     recent and remote memory intact;     language fluent;    Cranial Nerves:    The pupils are equal, round, and reactive to light.Trigeminal sensation is intact and the muscles of mastication are normal. The face is symmetric. The palate elevates in the midline. Hearing intact. Voice is normal. Shoulder shrug is normal. The tongue has normal motion without fasciculations.   Coordination: Intact bilaterally  Motor Observation:    No asymmetry, no atrophy, and no involuntary movements noted. Tone:    Normal muscle tone.     Strength:    Strength is V/V in the upper and lower limbs.      Sensation: intact to LT   ASSESSMENT/PLAN Ms. Tivis RingerMaria Arana-Torres is a 57 y.o. female with history of HTN and diabetes presenting with nausea, vomiting, dizziness and unsteady gait.   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 Mild effacement 4th ventricle.    Serial CT head x 5 evolving R PICA infarct w/o HT. Regional mass effect w/o hydrocephalus.   Repeat MRI right PICA infarct with fourth ventricle effacement, no hydrocephalus  carotid Doppler  B ICA 1-39% stenosis, VAs antegrade   2D Echo  EF 55-60%. No source of embolus   Recommend TEE and loop to  further evaluate for embolic source   LDL 95  HgbA1c 8.9  HIV/RPR negative  SCDs for VTE prophylaxis  No antithrombotic prior to admission, now on aspirin 325 mg daily.   Therapy recommendations:  CIR  Disposition:  pending   Cerebral edema Induced Hypernatremia  CT with ongoing edema but stable, no hydro  off 3% saline now  Na 141->138->137  D/c central line   Hypertension  Home Meds:  norvasc 10, cozaar 100  BP 140-150  On amlodipine 10, labetalol 200 tid, losartan 50 bid  Long-term BP goal normotensive  Hyperlipidemia  Home meds:  crestor 20  LDL 95, goal < 70  resumed home crestor in hospital  Continue statin at discharge  Diabetes type II  HgbA1c 8.9, goal < 7.0  Home meds:  lantus 16 hs, glucophage 1000 bid  Hyperglycemia improving  SSI  CBG monitoring  increase lantus to home dose 16 qhs  Other Stroke Risk Factors  ETOH use, advised to drink no more than 1 drink(s) a day  Obesity, Body mass index is 33.55 kg/m., recommend weight loss, diet and exercise as appropriate   Hypokalemia, 2.8-> 3.0->3.3->5.0   Hospital day # 3  This patient is critically ill due to right large cerebellar stroke, hypertensive emergency, hyperglycemia and at significant risk of neurological worsening, death form recurrent stroke, hemorrhagic conversion, hydrocephalus, DKA, heart failure. This patient's care requires constant monitoring of vital signs, hemodynamics, respiratory and cardiac  monitoring, review of multiple databases, neurological assessment, discussion with family, other specialists and medical decision making of high complexity. I spent 30 minutes of neurocritical care time in the care of this patient.  Marvel PlanJindong Chantia Amalfitano, MD PhD Stroke Neurology 02/06/2018 12:13 PM

## 2018-02-06 NOTE — Progress Notes (Signed)
Stroke neurology progress note  INTERVAL HISTORY Patient lying in bed, her daughter at bedside as interpreter.  Patient denies headache, nausea or vomiting.  Off 3% saline now. PT/OT recommend CIR        Vitals:   02/03/18 0600 02/03/18 0700 02/03/18 0800 02/03/18 0900  BP: (!) 156/67 (!) 172/82 119/71 (!) 160/90  Pulse: 87 90 94 95  Resp: 14 14 13  (!) 23  Temp:   98.8 F (37.1 C)   TempSrc:   Oral   SpO2: 95% 95% 96% 96%  Weight:      Height:        CBC:  LastLabs        Recent Labs  Lab 01/31/18 1035  02/02/18 0637 02/03/18 0530  WBC 10.0  --  11.9* 7.6  NEUTROABS 8.5*  --   --   --   HGB 13.8   < > 12.4 12.4  HCT 42.2   < > 37.8 38.1  MCV 88.5  --  89.6 89.4  PLT 237  --  258 221   < > = values in this interval not displayed.      Basic Metabolic Panel:  LastLabs        Recent Labs  Lab 02/02/18 0637  02/03/18 0016 02/03/18 0530  NA 140   < > 140 139  K 2.8*  --   --  3.0*  CL 104  --   --  105  CO2 25  --   --  24  GLUCOSE 119*  --   --  171*  BUN <5*  --   --  <5*  CREATININE 0.38*  --   --  0.45  CALCIUM 8.2*  --   --  8.1*  MG  --   --   --  2.0   < > = values in this interval not displayed.     Lipid Panel:  Labs(Brief)          Component Value Date/Time   CHOL 158 02/01/2018 0011   CHOL 199 11/25/2017 1040   TRIG 69 02/01/2018 0011   HDL 49 02/01/2018 0011   HDL 48 11/25/2017 1040   CHOLHDL 3.2 02/01/2018 0011   VLDL 14 02/01/2018 0011   LDLCALC 95 02/01/2018 0011   LDLCALC 128 (H) 11/25/2017 1040     HgbA1c:  RecentLabs       Lab Results  Component Value Date   HGBA1C 8.9 (H) 02/01/2018     Urine Drug Screen:  Labs(Brief)          Component Value Date/Time   LABOPIA NONE DETECTED 01/31/2018 1042   COCAINSCRNUR NONE DETECTED 01/31/2018 1042   LABBENZ NONE DETECTED 01/31/2018 1042   AMPHETMU NONE DETECTED 01/31/2018 1042   THCU NONE DETECTED 01/31/2018 1042   LABBARB NONE  DETECTED 01/31/2018 1042      Alcohol Level  Labs(Brief)          Component Value Date/Time   ETH <10 01/31/2018 1034      IMAGING  Dg Chest 1 View 02/01/2018 IMPRESSION:  1. Left IJ line seen ending about the cavoatrial junction, in expected position.  2. Lungs mildly hypoexpanded. Mild vascular congestion and borderline cardiomegaly. Increased interstitial markings may reflect mild interstitial edema.    Ct Head Wo Contrast 02/02/2018 IMPRESSION:  Progressive edema and mass-effect due to right PICA infarct. No hemorrhage Progressive low density in the left brachium pontis which may be edema. No infarct in this area on  recent MRI.    Ct Head Wo Contrast 02/02/2018 IMPRESSION:  Evolving RIGHT PICA territory infarct with worsening edema (potentially cytotoxic) and minimal suspected petechial edema. No obstructive hydrocephalus though increasingly effaced foramen magnum at risk for hydrocephalus.    Ct Head Wo Contrast 02/01/2018 IMPRESSION: Slight progression of low-density and swelling within the inferior cerebellum on the right consistent with posterior inferior cerebellar artery territory infarction. Right cerebellar tonsil protrudes through the foramen magnum. Mass-effect upon the fourth ventricle but no sign obstruction or change in ventricular size. No sign of bleeding.   Mr Brain Wo Contrast 02/03/2018 IMPRESSION:  1. Evolving acute RIGHT cerebellar PICA territory infarct with petechial hemorrhage. Partially effaced fourth ventricle without obstructive hydrocephalus.  2. No LEFT brachium pontis abnormality.   Carotid Doppler   There is 1-39% bilateral ICA stenosis. Vertebral artery flow is antegrade.   2D echocardiogram - Left ventricle: The cavity size was normal. There was mildconcentric hypertrophy. Systolic function was normal. Theestimated ejection fraction was in the range of 55% to 60%. Wallmotion was normal; there were no regional wall  motionabnormalities. Doppler parameters are consistent with abnormalleft ventricular relaxation (grade 1 diastolic dysfunction).Doppler parameters are consistent with indeterminate ventricularfilling pressure. - Aorta: Ascending aortic diameter: 38 mm (S). - Ascending aorta: The ascending aorta was mildly dilated. - Mitral valve: Transvalvular velocity was within the normal range.There was no evidence for stenosis. There was trivial regurgitation. - Right ventricle: The cavity size was normal. Wall thickness wasnormal. Systolic function was normal. - Tricuspid valve: There was trivial regurgitation. - Pulmonary arteries: Systolic pressure was within the normalrange. PA peak pressure: 32 mm Hg (S).    Physical Exam:  No change in exam today NAD, pleasant                  Speech:    Speech is normal; fluent and spontaneous with normal comprehension.  Cognition:    The patient is oriented to person, place, and time;     recent and remote memory intact;     language fluent;    Cranial Nerves:    The pupils are equal, round, and reactive to light.Trigeminal sensation is intact and the muscles of mastication are normal. The face is symmetric. The palate elevates in the midline. Hearing intact. Voice is normal. Shoulder shrug is normal. The tongue has normal motion without fasciculations.   Coordination: Intact bilaterally  Motor Observation:    No asymmetry, no atrophy, and no involuntary movements noted. Tone:    Normal muscle tone.     Strength:    Strength is V/V in the upper and lower limbs.      Sensation: intact to LT   ASSESSMENT/PLAN Ms. April Fry is a 57 y.o. female with history of HTN and diabetes presenting with nausea, vomiting, dizziness and unsteady gait.   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 Mild effacement 4th ventricle.    Serial CT head x 5 evolving R PICA infarct w/o HT. Regional mass effect w/o hydrocephalus.   Repeat MRI right PICA infarct with fourth ventricle effacement, no hydrocephalus  carotid Doppler  B ICA 1-39% stenosis, VAs antegrade   2D Echo  EF 55-60%. No source of embolus   Recommend TEE and loop to  further evaluate for embolic source   LDL 95  HgbA1c 8.9  HIV/RPR negative  SCDs for VTE prophylaxis  No antithrombotic prior to admission, now on aspirin 325 mg daily.   Therapy recommendations:  CIR  Disposition:  pending   Cerebral edema Induced Hypernatremia  CT with ongoing edema but stable, no hydro  off 3% saline now  Na 141->138->137  D/c central line   Hypertension  Home Meds:  norvasc 10, cozaar 100  BP 140-150  On amlodipine 10, labetalol 200 tid, losartan 50 bid  Long-term BP goal normotensive  Hyperlipidemia  Home meds:  crestor 20  LDL 95, goal < 70  resumed home crestor in hospital  Continue statin at discharge  Diabetes type II  HgbA1c 8.9, goal < 7.0  Home meds:  lantus 16 hs, glucophage 1000 bid  Hyperglycemia improving  SSI  CBG monitoring  increase lantus to home dose 16 qhs  Other Stroke Risk Factors  ETOH use, advised to drink no more than 1 drink(s) a day  Obesity, Body mass index is 33.55 kg/m., recommend weight loss, diet and exercise as appropriate   Hypokalemia, 2.8-> 3.0->3.3->5.0   Hospital day # 3  This patient is critically ill due to right large cerebellar stroke, hypertensive emergency, hyperglycemia and at significant risk of neurological worsening, death form recurrent stroke, hemorrhagic conversion, hydrocephalus, DKA, heart failure. This patient's care requires constant monitoring of vital signs, hemodynamics, respiratory and cardiac  monitoring, review of multiple databases, neurological assessment, discussion with family, other specialists and medical decision making of high complexity. I spent 30 minutes of neurocritical care time in the care of this patient.  Marvel PlanJindong Nene Aranas, MD PhD Stroke Neurology 02/06/2018 12:13 PM

## 2018-02-06 NOTE — Progress Notes (Signed)
Pt. Arrived to unit to 3W15 from 4N. Pt is A&O x4. Patient denies pain. Family at bedside. CCMD notified. All belongings at bedside. Nurse will continue to monitor.

## 2018-02-07 ENCOUNTER — Other Ambulatory Visit (HOSPITAL_COMMUNITY): Payer: Self-pay

## 2018-02-07 ENCOUNTER — Encounter (HOSPITAL_COMMUNITY): Payer: Self-pay | Admitting: Certified Registered Nurse Anesthetist

## 2018-02-07 ENCOUNTER — Other Ambulatory Visit: Payer: Self-pay

## 2018-02-07 DIAGNOSIS — E1149 Type 2 diabetes mellitus with other diabetic neurological complication: Secondary | ICD-10-CM

## 2018-02-07 DIAGNOSIS — E6609 Other obesity due to excess calories: Secondary | ICD-10-CM

## 2018-02-07 DIAGNOSIS — E876 Hypokalemia: Secondary | ICD-10-CM

## 2018-02-07 DIAGNOSIS — E785 Hyperlipidemia, unspecified: Secondary | ICD-10-CM

## 2018-02-07 DIAGNOSIS — Z6833 Body mass index (BMI) 33.0-33.9, adult: Secondary | ICD-10-CM

## 2018-02-07 DIAGNOSIS — R739 Hyperglycemia, unspecified: Secondary | ICD-10-CM | POA: Diagnosis present

## 2018-02-07 LAB — GLUCOSE, CAPILLARY
Glucose-Capillary: 231 mg/dL — ABNORMAL HIGH (ref 70–99)
Glucose-Capillary: 188 mg/dL — ABNORMAL HIGH (ref 70–99)
Glucose-Capillary: 231 mg/dL — ABNORMAL HIGH (ref 70–99)
Glucose-Capillary: 225 mg/dL — ABNORMAL HIGH (ref 70–99)
Glucose-Capillary: 184 mg/dL — ABNORMAL HIGH (ref 70–99)

## 2018-02-07 LAB — BASIC METABOLIC PANEL
Anion gap: 10 (ref 5–15)
BUN: 12 mg/dL (ref 6–20)
CHLORIDE: 102 mmol/L (ref 98–111)
CO2: 25 mmol/L (ref 22–32)
Calcium: 9.2 mg/dL (ref 8.9–10.3)
Creatinine, Ser: 0.54 mg/dL (ref 0.44–1.00)
GFR calc Af Amer: >60 mL/min (ref >60)
GFR calc non Af Amer: >60 mL/min (ref >60)
Glucose, Bld: 235 mg/dL — ABNORMAL HIGH (ref 70–99)
POTASSIUM: 3.7 mmol/L (ref 3.5–5.1)
Sodium: 137 mmol/L (ref 135–145)

## 2018-02-07 LAB — CBC
HCT: 39.3 % (ref 36.0–46.0)
Hemoglobin: 13.3 g/dL (ref 12.0–15.0)
MCH: 29.5 pg (ref 26.0–34.0)
MCHC: 33.8 g/dL (ref 30.0–36.0)
MCV: 87.1 fL (ref 80.0–100.0)
Platelets: 291 10*3/uL (ref 150–400)
RBC: 4.51 MIL/uL (ref 3.87–5.11)
RDW: 12.8 % (ref 11.5–15.5)
WBC: 7.1 10*3/uL (ref 4.0–10.5)
nRBC: 0 % (ref 0.0–0.2)

## 2018-02-07 NOTE — Interval H&P Note (Signed)
History and Physical Interval Note:  02/07/2018 10:14 AM  April Fry  has presented today for surgery, with the diagnosis of stroke  The various methods of treatment have been discussed with the patient and family. After consideration of risks, benefits and other options for treatment, the patient has consented to  Procedure(s): TRANSESOPHAGEAL ECHOCARDIOGRAM (TEE) (N/A) as a surgical intervention .  The patient's history has been reviewed, patient examined, no change in status, stable for surgery.  I have reviewed the patient's chart and labs.  Questions were answered to the patient's satisfaction.     Dovey Fatzinger

## 2018-02-07 NOTE — Progress Notes (Signed)
Physical Therapy Treatment Patient Details Name: April Fry MRN: [id number] DOB: 05/18/1960 Today's Date: 02/07/2018    History of Present Illness April Fry is a 57 y.o. female with history of hypertension, diabetes. Came to ED with n/v, room spinning, and difficulty walking. 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    PT Comments    Much improved stability with most mobility.  Pt is still guarded with gait and is tentative with scanning and directional changes, but can manage without an AD now.  Speed is slowly increasing.   Follow Up Recommendations  Home health PT(with progression to OPPT as necessary)     Equipment Recommendations  Other (comment)(TBD, may need cane.)    Recommendations for Other Services       Precautions / Restrictions Precautions Precautions: Fall Precaution Comments: just a little dizzy now, mild fall risk    Mobility  Bed Mobility   Bed Mobility: Supine to Sit Rolling: Modified independent (Device/Increase time)            Transfers Overall transfer level: Needs assistance   Transfers: Sit to/from Stand Sit to Stand: Supervision            Ambulation/Gait Ambulation/Gait assistance: Min guard Gait Distance (Feet): 500 Feet Assistive device: Rolling walker (2 wheeled);None Gait Pattern/deviations: Step-through pattern Gait velocity: moderate Gait velocity interpretation: >2.62 ft/sec, indicative of community ambulatory General Gait Details: pt is still guarded, but able to increase speed to cues, she is able to scan her envirionment with mild deviation, mostly drifting.   Stairs             Wheelchair Mobility    Modified Rankin (Stroke Patients Only) Modified Rankin (Stroke Patients Only) Modified Rankin: Moderate disability     Balance Overall balance assessment: Needs assistance   Sitting balance-Leahy Scale: Good       Standing balance-Leahy Scale: Fair                               Cognition Arousal/Alertness: Awake/alert Behavior During Therapy: WFL for tasks assessed/performed Overall Cognitive Status: Within Functional Limits for tasks assessed                         Following Commands: Follows multi-step commands consistently       General Comments: NT formally, improving consistently      Exercises      General Comments        Pertinent Vitals/Pain Pain Assessment: No/denies pain    Home Living                      Prior Function            PT Goals (current goals can now be found in the care plan section) Acute Rehab PT Goals Patient Stated Goal: "to get better" PT Goal Formulation: With patient Time For Goal Achievement: 02/15/18 Potential to Achieve Goals: Good Progress towards PT goals: Progressing toward goals    Frequency    Min 3X/week      PT Plan Discharge plan needs to be updated;Frequency needs to be updated    Co-evaluation              AM-PAC PT "6 Clicks" Mobility   Outcome Measure  Help needed turning from your back to your side while in a flat bed without using bedrails?: None Help needed moving  from lying on your back to sitting on the side of a flat bed without using bedrails?: None Help needed moving to and from a bed to a chair (including a wheelchair)?: A Little Help needed standing up from a chair using your arms (e.g., wheelchair or bedside chair)?: A Little Help needed to walk in hospital room?: A Little Help needed climbing 3-5 steps with a railing? : A Lot 6 Click Score: 19    End of Session   Activity Tolerance: Patient tolerated treatment well Patient left: in chair;with call bell/phone within reach Nurse Communication: Mobility status PT Visit Diagnosis: Unsteadiness on feet (R26.81);Difficulty in walking, not elsewhere classified (R26.2)     Time: [id number] PT Time Calculation (min) (ACUTE ONLY): 20 min  Charges:  $Gait Training:  8-22 mins                     02/07/2018  [place] BingKen Sibbie Flammia, PT Acute Rehabilitation Services [phone]  (pager) [phone]  (office)   Eliseo GumKenneth V Ragen Laver 02/07/2018, 1:40 PM

## 2018-02-07 NOTE — H&P (View-Only) (Signed)
[place] PHYSICAL MEDICINE & REHABILITATION PROGRESS NOTE  Subjective/Complaints: Patient seen sitting up in her chair this morning.  Interaction limited due to language.  ROS: Limited due to language  Objective: Vital Signs: Blood pressure 140/73, pulse 78, temperature 98.4 F (36.9 C), temperature source Oral, resp. rate 18, height 5' 2" (1.575 m), weight 83.2 kg, SpO2 99 %. No results found. Recent Labs    02/06/18 0600 02/07/18 0529  WBC 6.9 7.1  HGB 12.2 13.3  HCT 37.0 39.3  PLT 220 291   Recent Labs    02/06/18 0600 02/07/18 0529  NA 137 137  K 5.0 3.7  CL 103 102  CO2 27 25  GLUCOSE 160* 235*  BUN 7 12  CREATININE 0.53 0.54  CALCIUM 8.8* 9.2    Physical Exam: BP 140/73 (BP Location: Left Arm)   Pulse 78   Temp 98.4 F (36.9 C) (Oral)   Resp 18   Ht 5' 2" (1.575 m)   Wt 83.2 kg   SpO2 99%   BMI 33.55 kg/m  Constitutional: No distress . Vital signs reviewed. HENT: Normocephalic.  Atraumatic. Eyes: EOMI. No discharge. Cardiovascular: RRR. No JVD. Respiratory: CTA Bilaterally. Normal effort. GI: BS +. Non-distended. Musc: No edema or tenderness in extremities. Neuro: Alert Motor: Right upper extremity: 4+/5 proximal distal Left upper extremity: 5/5 proximal distal Right lower extremity: 4-/5 proximal distal Left lower extremity: 5/5 proximal distal   Assessment/Plan   1.  Right PICA cerebellar infarct  CT head on 1227 reviewed, showing right PICA infarct  Functional improving, continue PT/OT  Plan for procedure tomorrow we will follow-up from CIR perspective  2.  Hypertension  Monitor and provide prns in accordance with increased physical exertion and pain   LOS: 7 days A FACE TO FACE EVALUATION WAS PERFORMED  April Fry April Fry 02/07/2018, 2:11 PM 

## 2018-02-07 NOTE — Progress Notes (Addendum)
Stroke neurology progress note  INTERVAL HISTORY Pt has no complaints, now out of ICU and on neuro floor. No family at bedside. I used hospital interpreter for exam. She was scheduled for TEE/Loop today, but this was canceled since she received breakfast tray and ate 100%. NPO after MN now orderd        Vitals:   02/03/18 0600 02/03/18 0700 02/03/18 0800 02/03/18 0900  BP: (!) 156/67 (!) 172/82 119/71 (!) 160/90  Pulse: 87 90 94 95  Resp: 14 14 13  (!) 23  Temp:   98.8 F (37.1 C)   TempSrc:   Oral   SpO2: 95% 95% 96% 96%  Weight:      Height:        CBC:  LastLabs        Recent Labs  Lab 01/31/18 1035  02/02/18 0637 02/03/18 0530  WBC 10.0  --  11.9* 7.6  NEUTROABS 8.5*  --   --   --   HGB 13.8   < > 12.4 12.4  HCT 42.2   < > 37.8 38.1  MCV 88.5  --  89.6 89.4  PLT 237  --  258 221   < > = values in this interval not displayed.      Basic Metabolic Panel:  LastLabs        Recent Labs  Lab 02/02/18 0637  02/03/18 0016 02/03/18 0530  NA 140   < > 140 139  K 2.8*  --   --  3.0*  CL 104  --   --  105  CO2 25  --   --  24  GLUCOSE 119*  --   --  171*  BUN <5*  --   --  <5*  CREATININE 0.38*  --   --  0.45  CALCIUM 8.2*  --   --  8.1*  MG  --   --   --  2.0   < > = values in this interval not displayed.     Lipid Panel:  Labs(Brief)          Component Value Date/Time   CHOL 158 02/01/2018 0011   CHOL 199 11/25/2017 1040   TRIG 69 02/01/2018 0011   HDL 49 02/01/2018 0011   HDL 48 11/25/2017 1040   CHOLHDL 3.2 02/01/2018 0011   VLDL 14 02/01/2018 0011   LDLCALC 95 02/01/2018 0011   LDLCALC 128 (H) 11/25/2017 1040     HgbA1c:  RecentLabs       Lab Results  Component Value Date   HGBA1C 8.9 (H) 02/01/2018     Urine Drug Screen:  Labs(Brief)          Component Value Date/Time   LABOPIA NONE DETECTED 01/31/2018 1042   COCAINSCRNUR NONE DETECTED 01/31/2018 1042   LABBENZ NONE DETECTED 01/31/2018  1042   AMPHETMU NONE DETECTED 01/31/2018 1042   THCU NONE DETECTED 01/31/2018 1042   LABBARB NONE DETECTED 01/31/2018 1042      Alcohol Level  Labs(Brief)          Component Value Date/Time   ETH <10 01/31/2018 1034      IMAGING reviewed:  Dg Chest 1 View 02/01/2018 IMPRESSION:  1. Left IJ line seen ending about the cavoatrial junction, in expected position.  2. Lungs mildly hypoexpanded. Mild vascular congestion and borderline cardiomegaly. Increased interstitial markings may reflect mild interstitial edema.    Ct Head Wo Contrast 02/02/2018 IMPRESSION:  Progressive edema and mass-effect due to right PICA  infarct. No hemorrhage Progressive low density in the left brachium pontis which may be edema. No infarct in this area on recent MRI.    Ct Head Wo Contrast 02/02/2018 IMPRESSION:  Evolving RIGHT PICA territory infarct with worsening edema (potentially cytotoxic) and minimal suspected petechial edema. No obstructive hydrocephalus though increasingly effaced foramen magnum at risk for hydrocephalus.    Ct Head Wo Contrast 02/01/2018 IMPRESSION: Slight progression of low-density and swelling within the inferior cerebellum on the right consistent with posterior inferior cerebellar artery territory infarction. Right cerebellar tonsil protrudes through the foramen magnum. Mass-effect upon the fourth ventricle but no sign obstruction or change in ventricular size. No sign of bleeding.   Mr Brain Wo Contrast 02/03/2018 IMPRESSION:  1. Evolving acute RIGHT cerebellar PICA territory infarct with petechial hemorrhage. Partially effaced fourth ventricle without obstructive hydrocephalus.  2. No LEFT brachium pontis abnormality.   Carotid Doppler   There is 1-39% bilateral ICA stenosis. Vertebral artery flow is antegrade.   2D echocardiogram - Left ventricle: The cavity size was normal. There was mildconcentric hypertrophy. Systolic function was normal.  Theestimated ejection fraction was in the range of 55% to 60%. Wallmotion was normal; there were no regional wall motionabnormalities. Doppler parameters are consistent with abnormalleft ventricular relaxation (grade 1 diastolic dysfunction).Doppler parameters are consistent with indeterminate ventricularfilling pressure. - Aorta: Ascending aortic diameter: 38 mm (S). - Ascending aorta: The ascending aorta was mildly dilated. - Mitral valve: Transvalvular velocity was within the normal range.There was no evidence for stenosis. There was trivial regurgitation. - Right ventricle: The cavity size was normal. Wall thickness wasnormal. Systolic function was normal. - Tricuspid valve: There was trivial regurgitation. - Pulmonary arteries: Systolic pressure was within the normalrange. PA peak pressure: 32 mm Hg (S).    Physical Exam:  No change in exam today NAD, pleasant                  Speech:    Speech is normal; fluent and spontaneous with normal comprehension. No dysarthria Cognition:    The patient is oriented to person, place, and time. Recent and remote memory intact.   Cranial Nerves:    The pupils are equal, round, and reactive to light.Trigeminal sensation is intact and the muscles of mastication are normal. The face is symmetric. The palate elevates in the midline. Hearing intact. Voice is normal. Shoulder shrug is normal. The tongue has normal motion without fasciculations.   Coordination: Intact bilaterally  Motor Observation:    No asymmetry, no atrophy, and no involuntary movements noted. Tone:    Normal muscle tone.     Strength:    Strength is V/V in the upper and lower limbs.      Sensation: intact to LT   ASSESSMENT/PLAN Ms. April Fry is a 57 y.o. female with history of HTN and diabetes presenting with nausea, vomiting, dizziness and unsteady gait.   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 Mild effacement 4th ventricle.    Serial CT head x 5 evolving R PICA infarct w/o HT. Regional mass effect w/o hydrocephalus.   Repeat MRI right PICA infarct with fourth ventricle effacement, no hydrocephalus  carotid Doppler  B ICA 1-39% stenosis, VAs antegrade   2D Echo  EF 55-60%.  No source of embolus   TEE and loop to further evaluate for embolic source (rescheduled for tomorrow)  LDL 95  HgbA1c 8.9  HIV/RPR negative  SCDs for VTE prophylaxis  No antithrombotic prior to admission, now on aspirin 325 mg daily.   Therapy recommendations:  CIR vs Home with HH  Disposition:  tomorrow after TEE/Loop placement  Cerebral edema Induced Hypernatremia  CT with edema but now stable, no hydro  off 3% saline now  Na 141->138->137 today  Hypertension  Home Meds:  norvasc 10, cozaar 100  BP 140-150  On amlodipine 10, labetalol 200 tid, losartan 50 bid  Long-term BP goal normotensive  Hyperlipidemia  Home meds:  crestor 20  LDL 95, goal < 70  resumed home crestor in hospital  Continue statin at discharge  Diabetes type II  HgbA1c 8.9, goal < 7.0  Home meds:  lantus 16 hs, glucophage 1000 bid  Hyperglycemia improving  SSI  CBG monitoring  increase lantus to home dose 16 qhs  Other Stroke Risk Factors  ETOH use, advised to drink no more than 1 drink(s) a day  Obesity, Body mass index is 33.55 kg/m., recommend weight loss, diet and exercise as appropriate   Hypokalemia, 2.8-> 3.0->3.3->5.0   Hospital day # 4  NPO after MN TEE/Loop placement scheduled for tomorrow am Possible d/c tomorrow post procedure Stroke education and secondary prevention done with interpretor I have d/w EP NP and RN. I have spent 35min of face/face time and  coordination of care today thus far.    April Fry, ARNP-C, ANVP-BC Stroke Neurology 02/07/2018 1:41 PM  ATTENDING NOTE: I reviewed above note and agree with the assessment and plan. Pt was seen and examined.   Patient sitting in chair, no complaints.  No headache.  Not able to do TEE today due to not n.p.o. status.  Reschedule TEE tomorrow with n.p.o. after midnight.  PT/OT recommend CIR and rehab consultation placed.  Sodium 137.  Still has intermittent hyperglycemia, on Lantus home dose.  BP stable on p.o. medication.  Continue aspirin and Crestor.  Plan for DC tomorrow after TEE.  April PlanJindong Eliyanna Ault, MD PhD Stroke Neurology 02/07/2018 5:31 PM

## 2018-02-07 NOTE — Progress Notes (Addendum)
Inpatient Diabetes Program Recommendations  AACE/ADA: New Consensus Statement on Inpatient Glycemic Control (2015)  Target Ranges:  Prepandial:   less than 140 mg/dL      Peak postprandial:   less than 180 mg/dL (1-2 hours)      Critically ill patients:  140 - 180 mg/dL   Lab Results  Component Value Date   GLUCAP 225 (H) 02/07/2018   HGBA1C 8.9 (H) 02/01/2018    DM2  Home DM meds: Lantus 16 units q hs                             Glucophage 1000mg  BID    Current DM inpatient meds: Lantus 16 units q hs                                              Novolog resistant scale (0-20 units) tid and (0-5 units) hs   Spoke with patient at length about her diabetes via [employer] interpreter Lawanna Kobus(Angel # [phone]). Discussed Hgb A1c and how her levels have come down from 10.3% (average of 249 mg/dl) in October to 2.8%8.9% (average of 209 mg/dl). I encouraged her and told her her blood glucose level is getting closer to normal levels. I asked if she was doing anything different. She stated her medicines were changed not too long ago. [facility] 12/30/17. Note on 12/16/17 she was taken OFF of NPH and Glipizide and changed to Lantus and Glucophage.   Discussed food/drink in relation to diabetes. She said she mostly drinks water but when has coke she dilutes it with water/ice. Encouraged unsweetened drinks or diet coke/coke zero if able. Patient is interested in talking to a dietician while inpatient. Consult ordered.   States she has a working Water engineerCBG machine. She pulled out her cell phone and showed me a picture of all her syringes/CBG supplies/meds that she has at home. All questions answered via interpreter.  -- Will follow during hospitalization.--  Jamelle RushingSusan Tonja Jezewski RN, MSN Diabetes Coordinator Inpatient Glycemic Control Team Team Pager: [phone] (8am-5pm)

## 2018-02-07 NOTE — Plan of Care (Signed)
Pt is progressing toward expected goal. Pt is able to communicate and express needs, pt is able to communicate with spanish [employer] interpreter

## 2018-02-07 NOTE — Consult Note (Addendum)
ELECTROPHYSIOLOGY CONSULT NOTE  Patient ID: April Fry MRN: [id number], DOB/AGE: May 21, 1960   Admit date: 01/31/2018 Date of Consult: 02/07/2018  Primary Physician: Antony Blackbird, MD Primary Cardiologist: none Reason for Consultation: Cryptogenic stroke ; recommendations regarding Implantable Loop Recorder, requested by Dr. Erlinda Hong  History of Present Illness April Fry was admitted on 01/31/2018 with acute stroke.  She developed symptoms of dizziness, vomiting/headache, gait instability. 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  she has undergone workup for stroke including echocardiogram and carotid dopplers and angio.  The patient has been monitored on telemetry which has demonstrated sinus rhythm with no arrhythmias.  Inpatient stroke work-up is to be completed with a TEE.   Echocardiogram this admission demonstrated    Study Conclusions - Left ventricle: The cavity size was normal. There was mild   concentric hypertrophy. Systolic function was normal. The   estimated ejection fraction was in the range of 55% to 60%. Wall   motion was normal; there were no regional wall motion   abnormalities. Doppler parameters are consistent with abnormal   left ventricular relaxation (grade 1 diastolic dysfunction).   Doppler parameters are consistent with indeterminate ventricular   filling pressure. - Aorta: Ascending aortic diameter: 38 mm (S). - Ascending aorta: The ascending aorta was mildly dilated. - Mitral valve: Transvalvular velocity was within the normal range.   There was no evidence for stenosis. There was trivial   regurgitation. - Right ventricle: The cavity size was normal. Wall thickness was   normal. Systolic function was normal. - Tricuspid valve: There was trivial regurgitation. - Pulmonary arteries: Systolic pressure was within  the normal   range. PA peak pressure: 32 mm Hg (S).   Lab work is reviewed.  Today's visit is done with the aid of [employer] Spanish translator April Fry # [phone]  Prior to admission, the patient denies chest pain, or syncope, though does report palpitations that at times make her feel tired and SOB, these can be as often as weekly.   She is recovering from their stroke with plans to CIR vs HH w/help at discharge.   Past Medical History:  Diagnosis Date  . Diabetes mellitus without complication ([place])   . Hypertension   . Pneumonia 05/21/2016     Surgical History:  Past Surgical History:  Procedure Laterality Date  . ABDOMINAL HYSTERECTOMY       Facility-Administered Medications Prior to Admission  Medication Dose Route Frequency Provider Last Rate Last Dose  . pneumococcal 13-valent conjugate vaccine (PREVNAR 13) injection 0.5 mL  0.5 mL Intramuscular Once Fredia Beets R, FNP       Medications Prior to Admission  Medication Sig Dispense Refill Last Dose  . amLODipine (NORVASC) 10 MG tablet Take 1 tablet (10 mg total) by mouth daily. 30 tablet 2 unknown  . insulin glargine (LANTUS) 100 UNIT/ML injection Inject 0.16 mLs (16 Units total) into the skin at bedtime. 10 mL 0 unknown  . losartan (COZAAR) 100 MG tablet Take 1 tablet (100 mg total) by mouth daily. 30 tablet 2 unknown  . metFORMIN (GLUCOPHAGE) 1000 MG tablet Take 1 tablet (1,000 mg total) by mouth 2 (two) times daily with a meal. 180 tablet 3 unknown  . rosuvastatin (CRESTOR) 20 MG tablet Take 1 tablet (20 mg total) by mouth daily. 30 tablet 2 unknown  . blood glucose meter kit and supplies KIT Dispense based on patient and  insurance preference. Use up to four times daily as directed. (FOR ICD-9 250.00, 250.01). (Patient not taking: Reported on 11/25/2017) 1 each 0 Not Taking  . Blood Glucose Monitoring Suppl (TRUE METRIX METER) w/Device KIT Use to check blood sugar in the morning. 1 kit 0   . ciclopirox (PENLAC) 8 %  solution Apply topically at bedtime. Apply over nail and surrounding skin. Apply daily over previous coat. After seven (7) days, may remove with alcohol and continue cycle. (Patient not taking: Reported on 11/25/2017) 6.6 mL 2 Not Taking  . glucose blood (TRUE METRIX BLOOD GLUCOSE TEST) test strip Use as instructed 100 each 12   . TRUEPLUS LANCETS 28G MISC Use to check blood sugar daily. 100 each 11     Inpatient Medications:  .  stroke: mapping our early stages of recovery book   Does not apply Once  . amLODipine  10 mg Oral Daily  . aspirin EC  325 mg Oral Daily  . insulin aspart  0-20 Units Subcutaneous TID WC  . insulin aspart  0-5 Units Subcutaneous QHS  . insulin glargine  16 Units Subcutaneous QHS  . labetalol  200 mg Oral TID  . losartan  50 mg Oral BID  . rosuvastatin  20 mg Oral Daily    Allergies: No Known Allergies  Social History   Socioeconomic History  . Marital status: Single    Spouse name: Not on file  . Number of children: Not on file  . Years of education: Not on file  . Highest education level: Not on file  Occupational History  . Not on file  Social Needs  . Financial resource strain: Not on file  . Food insecurity:    Worry: Not on file    Inability: Not on file  . Transportation needs:    Medical: Not on file    Non-medical: Not on file  Tobacco Use  . Smoking status: Never Smoker  . Smokeless tobacco: Never Used  Substance and Sexual Activity  . Alcohol use: Yes    Comment: on rare occasions.   . Drug use: No  . Sexual activity: Not on file  Lifestyle  . Physical activity:    Days per week: Not on file    Minutes per session: Not on file  . Stress: Not on file  Relationships  . Social connections:    Talks on phone: Not on file    Gets together: Not on file    Attends religious service: Not on file    Active member of club or organization: Not on file    Attends meetings of clubs or organizations: Not on file    Relationship status:  Not on file  . Intimate partner violence:    Fear of current or ex partner: Not on file    Emotionally abused: Not on file    Physically abused: Not on file    Forced sexual activity: Not on file  Other Topics Concern  . Not on file  Social History Narrative  . Not on file     Family History  Problem Relation Age of Onset  . Other Mother        parents passed away in an [place] in 56's  . Heart attack Sister   . Obesity Sister       Review of Systems: All other systems reviewed and are otherwise negative except as noted above.  Physical Exam: Vitals:   02/06/18 2017 02/07/18 0000 02/07/18 0350 02/07/18 0801  BP: 128/61 122/60 (!) 112/48 (!) 117/57  Pulse: 73 69 74 72  Resp: '18 18 18 18  ' Temp: 98.5 F (36.9 C) 98.3 F (36.8 C)  98.4 F (36.9 C)  TempSrc: Oral Oral  Oral  SpO2: 100% 99% 96% 97%  Weight:      Height:        GEN- The patient is well appearing, alert and oriented x 3 today.   Head- normocephalic, atraumatic Eyes-  Sclera clear, conjunctiva pink Ears- hearing intact Oropharynx- clear Neck- supple Lungs- CTA b/l, normal work of breathing Heart- RRR, no murmurs, rubs or gallops  GI- soft, NT, ND Extremities- no clubbing, cyanosis, or edema MS- no significant deformity or atrophy Skin- no rash or lesion Psych- euthymic mood, full affect   Labs:   Lab Results  Component Value Date   WBC 7.1 02/07/2018   HGB 13.3 02/07/2018   HCT 39.3 02/07/2018   MCV 87.1 02/07/2018   PLT 291 02/07/2018    Recent Labs  Lab 02/07/18 0529  NA 137  K 3.7  CL 102  CO2 25  BUN 12  CREATININE 0.54  CALCIUM 9.2  GLUCOSE 235*   Lab Results  Component Value Date   TROPONINI <0.03 05/21/2016   Lab Results  Component Value Date   CHOL 158 02/01/2018   CHOL 199 11/25/2017   CHOL 203 (H) 06/11/2016   Lab Results  Component Value Date   HDL 49 02/01/2018   HDL 48 11/25/2017   HDL 55 06/11/2016   Lab Results  Component Value Date   LDLCALC 95  02/01/2018   LDLCALC 128 (H) 11/25/2017   LDLCALC 126 (H) 06/11/2016   Lab Results  Component Value Date   TRIG 69 02/01/2018   TRIG 113 11/25/2017   TRIG 110 06/11/2016   Lab Results  Component Value Date   CHOLHDL 3.2 02/01/2018   CHOLHDL 4.1 11/25/2017   CHOLHDL 3.7 06/11/2016   No results found for: LDLDIRECT  Lab Results  Component Value Date   DDIMER <0.27 05/21/2016     Radiology/Studies:   Ct Angio Head W Or Wo Contrast Result Date: 01/31/2018 CLINICAL DATA:  Unsteady gait. Vomiting. Blurred vision. Abnormal cerebellum by CT. EXAM: CT ANGIOGRAPHY HEAD AND NECK TECHNIQUE: Multidetector CT imaging of the head and neck was performed using the standard protocol during bolus administration of intravenous contrast. Multiplanar CT image reconstructions and MIPs were obtained to evaluate the vascular anatomy. Carotid stenosis measurements (when applicable) are obtained utilizing NASCET criteria, using the distal internal carotid diameter as the denominator. CONTRAST:  198m ISOVUE-370 IOPAMIDOL (ISOVUE-370) INJECTION 76% COMPARISON:  Head CT same day FINDINGS: CTA NECK FINDINGS Aortic arch: Aortic arch is normal except for minimal atherosclerotic change. Right carotid system: Common carotid artery widely patent to the bifurcation. Mild atherosclerotic plaque at the carotid bifurcation but no stenosis or irregularity. Cervical ICA widely patent. Left carotid system: Common carotid artery widely patent to the bifurcation. Carotid bifurcation is normal without soft or calcified plaque. Cervical ICA is normal. Vertebral arteries: Both vertebral artery origins are widely patent. No subclavian stenosis proximal to the origins. Both vertebral artery origins appear widely patent through the cervical region to the foramen magnum. Skeleton: Normal Other neck: No mass or lymphadenopathy. Upper chest: Negative Review of the MIP images confirms the above findings CTA HEAD FINDINGS Anterior circulation:  Both internal carotid arteries are widely patent through the skull base and siphon regions. The anterior and middle cerebral vessels appear normal without  stenosis, aneurysm or vascular malformation. No missing branch vessels are identified. Posterior circulation: Both vertebral arteries are patent through the foramen magnum to the basilar. No vertebral stenosis or irregularity. I think the posterior inferior cerebellar arteries show flow on each side presently. Superior cerebellar and posterior cerebral arteries show flow. Venous sinuses: Patent and normal. Anatomic variants: None significant. Delayed phase: No abnormal enhancement. 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 Flow is present in both posterior inferior cerebellar arteries presently. No basilar artery disease or other posterior circulation branch vessel abnormality. Electronically Signed   By: Nelson Chimes M.D.   On: 01/31/2018 13:10    Dg Chest 1 View Result Date: 02/01/2018 CLINICAL DATA:  Central line repositioning. EXAM: CHEST  1 VIEW COMPARISON:  Chest radiograph performed earlier today at 7:16 p.m. FINDINGS: The patient's left IJ line is now seen ending about the cavoatrial junction. The lungs are mildly hypoexpanded. Mild vascular crowding and vascular congestion are seen. Increased interstitial markings may reflect mild interstitial edema. There is no evidence of pleural effusion or pneumothorax. The cardiomediastinal silhouette is borderline enlarged. No acute osseous abnormalities are seen. IMPRESSION: 1. Left IJ line seen ending about the cavoatrial junction, in expected position. 2. Lungs mildly hypoexpanded. Mild vascular congestion and borderline cardiomegaly. Increased interstitial markings may reflect mild interstitial edema. Electronically Signed   By: Garald Balding M.D.    On: 02/01/2018 21:36    Ct Head Wo Contrast Result Date: 02/04/2018 CLINICAL DATA:  Follow-up cerebellar stroke. EXAM: CT HEAD WITHOUT CONTRAST TECHNIQUE: Contiguous axial images were obtained from the base of the skull through the vertex without intravenous contrast. COMPARISON:  Brain MRI 02/03/2018 and CT 02/02/2018 FINDINGS: Brain: Edema related to a large acute right PICA territory infarct is unchanged with persistent partial effacement of the fourth ventricle. There is no evidence of associated obstructive hydrocephalus. Minimal petechial hemorrhage is again noted within the infarct. No malignant hemorrhagic transformation or new infarct is identified. Vascular: No hyperdense vessel. Skull: No fracture or focal osseous lesion. Sinuses/Orbits: Visualized paranasal sinuses and mastoid air cells are clear. Orbits are unremarkable. Other: None. IMPRESSION: Unchanged appearance of large right PICA territory infarct with associated edema, petechial hemorrhage, and partial effacement of the fourth ventricle. No obstructive hydrocephalus. Electronically Signed   By: Logan Bores M.D.   On: 02/04/2018 17:07    Ct Head Wo Contrast Result Date: 02/02/2018 CLINICAL DATA:  Stroke follow-up EXAM: CT HEAD WITHOUT CONTRAST TECHNIQUE: Contiguous axial images were obtained from the base of the skull through the vertex without intravenous contrast. COMPARISON:  CT head 02/02/2018, MRI 01/31/2018 FINDINGS: Brain: Right PICA infarct shows progressive edema and mass-effect. Effacement of the fourth ventricle without hydrocephalus. No hemorrhage. Progressive low-density in the left cerebellum mostly in the region of the brachium pontis. Question edema. No infarct is seen on recent MRI in this territory. Negative for acute infarct or hydrocephalus. Vascular: Negative for hyperdense vessel Skull: Negative Sinuses/Orbits: Negative Other: None IMPRESSION: Progressive edema and mass-effect due to right PICA infarct. No  hemorrhage Progressive low density in the left brachium pontis which may be edema. No infarct in this area on recent MRI. Electronically Signed   By: Franchot Gallo M.D.   On: 02/02/2018 17:10    Mr Brain Wo Contrast Result Date: 02/03/2018 CLINICAL DATA:  Follow-up cerebellar infarcts. History of hypertension. EXAM: MRI HEAD WITHOUT  CONTRAST TECHNIQUE: Multiplanar, multiecho pulse sequences of the brain and surrounding structures were obtained without intravenous contrast. COMPARISON:  CT HEAD February 02, 2018 and MRI of the head January 31, 2018 FINDINGS: INTRACRANIAL CONTENTS: With low ADC values. No LEFT cerebellar reduced diffusion. RIGHT inferior cerebellar punctate susceptibility artifact compatible with petechial hemorrhage. Similar edema and regional mass effect partially effacing the fourth ventricle. No obstructive hydrocephalus. A few supratentorial punctate white matter FLAIR T2 hyperintensities compatible with mild chronic small vessel ischemic change. No intraparenchymal mass of or supratentorial mass effect. No abnormal extra-axial fluid collections. VASCULAR: Normal major intracranial vascular flow voids present at skull base. SKULL AND UPPER CERVICAL SPINE: No abnormal sellar expansion. No suspicious calvarial bone marrow signal. Craniocervical junction maintained. SINUSES/ORBITS: The mastoid air-cells and included paranasal sinuses are well-aerated.The included ocular globes and orbital contents are non-suspicious. OTHER: None. IMPRESSION: 1. Evolving acute RIGHT cerebellar PICA territory infarct with petechial hemorrhage. Partially effaced fourth ventricle without obstructive hydrocephalus. 2. No LEFT brachium pontis abnormality. Electronically Signed   By: Elon Alas M.D.   On: 02/03/2018 03:54    Mr Brain Wo Contrast Result Date: 01/31/2018 CLINICAL DATA:  57 year old female with unsteady gait, vomiting, evidence of right cerebellar infarct on CT today. EXAM: MRI HEAD WITHOUT  CONTRAST TECHNIQUE: Multiplanar, multiecho pulse sequences of the brain and surrounding structures were obtained without intravenous contrast. COMPARISON:  CT head and CTA head and neck earlier today. FINDINGS: Brain: Confluent restricted diffusion in the right inferior cerebellum, PICA territory extending from the vermis to the posterolateral surface up to 5.5 centimeters. Estimated infarct volume is 26 mL. No involvement of the brainstem is evident. Cytotoxic edema and petechial hemorrhage (series 8, image 5). Overall mild posterior fossa mass effect including partial effacement of the 4th ventricle. However, lateral and 3rd ventricle size remains normal. No other restricted diffusion. No supratentorial mass effect. No extra-axial collection. Scattered mild to moderate for age nonspecific subcortical cerebral white matter T2 and FLAIR hyperintensity. Negative deep gray matter nuclei. Negative pituitary. Vascular: Major intracranial vascular flow voids are preserved. Skull and upper cervical spine: Negative visible cervical spine and spinal cord. Visualized bone marrow signal is within normal limits. Sinuses/Orbits: Negative orbits. Paranasal sinuses and mastoids are stable and well pneumatized. Other: Scalp and face soft tissues are negative. IMPRESSION: 1. Moderate size acute Right PICA cerebellar infarct with cytotoxic edema and petechial hemorrhage. Mild posterior fossa mass effect including partial effacement of the 4th ventricle, but no ventriculomegaly or malignant hemorrhagic transformation. 2. No brainstem infarct or other acute intracranial abnormality. 3. Mild to moderate for age nonspecific cerebral white matter signal changes. Study discussed by telephone with Dr. Samara Snide on 01/31/2018 at 16:06 . Electronically Signed   By: Genevie Ann M.D.   On: 01/31/2018 16:06   Vas US Carotid (at [facility] Only) Result Date: 02/01/2018 Carotid Arterial Duplex Study Indications: CVA. Limitations: body habitus  Performing Technologist: Antonieta Pert RDMS, RVT  Examination Guidelines: A complete evaluation includes B-mode imaging, spectral Doppler, color Doppler, and power Doppler as needed of all accessible portions of each vessel. Bilateral testing is considered an integral part of a complete examination. Limited examinations for reoccurring indications may be performed as noted.   Summary: Right Carotid: Velocities in the right ICA are consistent with a 1-39% stenosis. Left Carotid: Velocities in the left ICA are consistent with a 1-39% stenosis.               The ECA appears <50% stenosed. Vertebrals:  Bilateral vertebral arteries demonstrate antegrade flow. *See table(s) above for measurements and observations.  Electronically signed by Harold Barban MD on 02/01/2018 at 10:56:32 AM.    Final     12-lead ECG SR All prior EKG's in EPIC reviewed with no documented atrial fibrillation  Telemetry SR  Assessment and Plan:  1. Cryptogenic stroke The patient presents with cryptogenic stroke.  The patient has a TEE planned for this AM.  I spoke at length with the patient about monitoring for afib with either a 30 day event monitor or an implantable loop recorder.  Risks, benefits, and alteratives to implantable loop recorder were discussed with the patient today.   The patient reports palpitations associated with fatigue and SOB, these can occur as often as weekly.  Given this, I think it is reasonable to start with an event monitor for this patient.  I discussed this with her and her daughter at bedside, they are in agreement.  There is some associate petechial hemorrhage with her stroke, if AFib is found, especially early, will need neurology clearance for a/c  Please call with questions.   Renee Dyane Dustman, PA-C 02/07/2018   EP Attending  Patient seen and examined.  Agree with the findings as noted above.  Communication is carried out with the Spanish interpreter nurse.  The patient is a very  pleasant 57 year old woman who presented with a stroke.  Her TEE was negative.  The patient gives a history of palpitations which occur for up to an hour once or twice a week.  These episodes are associated with fatigue and weakness and shortness of breath.  She has not previously sought medical attention. She has multiple risk factors for atrial fib. She has never had atrial fib documented. Her exam is notable for an overweight middle age woman, no distress. She notes some fatigue. Tele reveals NSR. Labs/CXR/TEE reviewed.  A/P 1. Crytpogenic stroke - her symptoms prior to admit suggest that she is having PAF. I would recommend she wear a 30 day monitor. If negative for stroke then it would be reasonable to consider placing an ILR.  Mikle Bosworth.D.

## 2018-02-07 NOTE — Progress Notes (Signed)
[facility] PHYSICAL MEDICINE & REHABILITATION PROGRESS NOTE  Subjective/Complaints: Patient seen sitting up in her chair this morning.  Interaction limited due to language.  ROS: Limited due to language  Objective: Vital Signs: Blood pressure 140/73, pulse 78, temperature 98.4 F (36.9 C), temperature source Oral, resp. rate 18, height 5\' 2"  (1.575 m), weight 83.2 kg, SpO2 99 %. No results found. Recent Labs    02/06/18 0600 02/07/18 0529  WBC 6.9 7.1  HGB 12.2 13.3  HCT 37.0 39.3  PLT 220 291   Recent Labs    02/06/18 0600 02/07/18 0529  NA 137 137  K 5.0 3.7  CL 103 102  CO2 27 25  GLUCOSE 160* 235*  BUN 7 12  CREATININE 0.53 0.54  CALCIUM 8.8* 9.2    Physical Exam: BP 140/73 (BP Location: Left Arm)   Pulse 78   Temp 98.4 F (36.9 C) (Oral)   Resp 18   Ht 5\' 2"  (1.575 m)   Wt 83.2 kg   SpO2 99%   BMI 33.55 kg/m  Constitutional: No distress . Vital signs reviewed. HENT: Normocephalic.  Atraumatic. Eyes: EOMI. No discharge. Cardiovascular: RRR. No JVD. Respiratory: CTA Bilaterally. Normal effort. GI: BS +. Non-distended. Musc: No edema or tenderness in extremities. Neuro: Alert Motor: Right upper extremity: 4+/5 proximal distal Left upper extremity: 5/5 proximal distal Right lower extremity: 4-/5 proximal distal Left lower extremity: 5/5 proximal distal   Assessment/Plan   1.  Right PICA cerebellar infarct  CT head on 1227 reviewed, showing right PICA infarct  Functional improving, continue PT/OT  Plan for procedure tomorrow we will follow-up from CIR perspective  2.  Hypertension  Monitor and provide prns in accordance with increased physical exertion and pain   LOS: 7 days A FACE TO FACE EVALUATION WAS PERFORMED  Akaisha Truman Karis JubaAnil Andalyn Heckstall 02/07/2018, 2:11 PM

## 2018-02-08 ENCOUNTER — Other Ambulatory Visit: Payer: Self-pay | Admitting: Physician Assistant

## 2018-02-08 ENCOUNTER — Encounter (HOSPITAL_COMMUNITY)
Admission: EM | Disposition: A | Discharge status: Home Health | Payer: Self-pay | Source: Home / Self Care | Attending: Neurology

## 2018-02-08 ENCOUNTER — Encounter (HOSPITAL_COMMUNITY): Payer: Self-pay | Admitting: *Deleted

## 2018-02-08 ENCOUNTER — Inpatient Hospital Stay (HOSPITAL_COMMUNITY): Payer: Self-pay

## 2018-02-08 DIAGNOSIS — I63441 Cerebral infarction due to embolism of right cerebellar artery: Principal | ICD-10-CM

## 2018-02-08 DIAGNOSIS — Z794 Long term (current) use of insulin: Secondary | ICD-10-CM

## 2018-02-08 DIAGNOSIS — I6389 Other cerebral infarction: Secondary | ICD-10-CM

## 2018-02-08 DIAGNOSIS — R002 Palpitations: Secondary | ICD-10-CM

## 2018-02-08 DIAGNOSIS — I639 Cerebral infarction, unspecified: Secondary | ICD-10-CM

## 2018-02-08 HISTORY — PX: TEE WITHOUT CARDIOVERSION: SHX5443

## 2018-02-08 LAB — BASIC METABOLIC PANEL
Anion gap: 9 (ref 5–15)
BUN: 14 mg/dL (ref 6–20)
CALCIUM: 9.1 mg/dL (ref 8.9–10.3)
CO2: 27 mmol/L (ref 22–32)
Chloride: 102 mmol/L (ref 98–111)
Creatinine, Ser: 0.51 mg/dL (ref 0.44–1.00)
GFR calc Af Amer: >60 mL/min (ref >60)
GFR calc non Af Amer: >60 mL/min (ref >60)
Glucose, Bld: 164 mg/dL — ABNORMAL HIGH (ref 70–99)
Potassium: 4 mmol/L (ref 3.5–5.1)
Sodium: 138 mmol/L (ref 135–145)

## 2018-02-08 LAB — CBC
HCT: 39.1 % (ref 36.0–46.0)
Hemoglobin: 12.8 g/dL (ref 12.0–15.0)
MCH: 28.7 pg (ref 26.0–34.0)
MCHC: 32.7 g/dL (ref 30.0–36.0)
MCV: 87.7 fL (ref 80.0–100.0)
Platelets: 262 10*3/uL (ref 150–400)
RBC: 4.46 MIL/uL (ref 3.87–5.11)
RDW: 12.7 % (ref 11.5–15.5)
WBC: 8 10*3/uL (ref 4.0–10.5)
nRBC: 0 % (ref 0.0–0.2)

## 2018-02-08 LAB — GLUCOSE, CAPILLARY
Glucose-Capillary: 150 mg/dL — ABNORMAL HIGH (ref 70–99)
Glucose-Capillary: 161 mg/dL — ABNORMAL HIGH (ref 70–99)
Glucose-Capillary: 109 mg/dL — ABNORMAL HIGH (ref 70–99)

## 2018-02-08 SURGERY — ECHOCARDIOGRAM, TRANSESOPHAGEAL
Anesthesia: Moderate Sedation

## 2018-02-08 MED ORDER — BUTAMBEN-TETRACAINE-BENZOCAINE 2-2-14 % EX AERO
INHALATION_SPRAY | CUTANEOUS | Status: DC | PRN
  Administered 2018-02-08: 2 via TOPICAL

## 2018-02-08 MED ORDER — LABETALOL HCL 200 MG PO TABS: 200.0000 mg | ORAL_TABLET | Freq: Two times a day (BID) | ORAL | 2 refills | Status: DC

## 2018-02-08 MED ORDER — LABETALOL HCL 200 MG PO TABS: 200.0000 mg | ORAL_TABLET | Freq: Two times a day (BID) | ORAL | Status: DC

## 2018-02-08 MED ORDER — FENTANYL CITRATE (PF) 100 MCG/2ML IJ SOLN
INTRAMUSCULAR | Status: AC
  Filled 2018-02-08: qty 2

## 2018-02-08 MED ORDER — MIDAZOLAM HCL (PF) 5 MG/ML IJ SOLN
INTRAMUSCULAR | Status: AC
  Filled 2018-02-08: qty 2

## 2018-02-08 MED ORDER — INSULIN GLARGINE 100 UNIT/ML ~~LOC~~ SOLN: 16.0000 [IU] | Freq: Every day | SUBCUTANEOUS | 2 refills | Status: DC

## 2018-02-08 MED ORDER — FENTANYL CITRATE (PF) 100 MCG/2ML IJ SOLN
INTRAMUSCULAR | Status: DC | PRN
  Administered 2018-02-08 (×2): 25 ug via INTRAVENOUS

## 2018-02-08 MED ORDER — ASPIRIN 325 MG PO TBEC: 325.0000 mg | DELAYED_RELEASE_TABLET | Freq: Every day | ORAL | 0 refills | Status: DC

## 2018-02-08 MED ORDER — SODIUM CHLORIDE 0.9 % IV SOLN: INTRAVENOUS | Status: DC

## 2018-02-08 MED ORDER — MIDAZOLAM HCL (PF) 10 MG/2ML IJ SOLN
INTRAMUSCULAR | Status: DC | PRN
  Administered 2018-02-08 (×2): 2 mg via INTRAVENOUS

## 2018-02-08 NOTE — Interval H&P Note (Signed)
History and Physical Interval Note:  02/08/2018 12:03 PM  April RingerMaria Fry  has presented today for surgery, with the diagnosis of stroke  The various methods of treatment have been discussed with the patient and family. After consideration of risks, benefits and other options for treatment, the patient has consented to  Procedure(s): TRANSESOPHAGEAL ECHOCARDIOGRAM (TEE) (N/A) as a surgical intervention .  The patient's history has been reviewed, patient examined, no change in status, stable for surgery.  I have reviewed the patient's chart and labs.  Questions were answered to the patient's satisfaction.     Coca ColaMark Skains

## 2018-02-08 NOTE — Progress Notes (Signed)
    This discussion was done with the aid of [employer] Spanish translator Richardson Chiquitoayibe # [phone]  [facility] on April Fry for stroke.  After careful review of history and examination, the risks and benefits of transesophageal echocardiogram have been explained including risks of esophageal damage, perforation (1:10,000 risk), bleeding, pharyngeal hematoma as well as other potential complications associated with conscious sedation including aspiration, arrhythmia, respiratory failure and death. Alternatives to treatment were discussed, questions were answered. Patient is willing to proceed.   Sheilah PigeonRenee Lynn Haila Dena, PA-C  02/08/2018 8:51 AM

## 2018-02-08 NOTE — CV Procedure (Signed)
   Transesophageal Echocardiogram  Indications: Stroke  Time out performed  During this procedure the patient is administered a total of Versed and Fentanyl (see nursing note) to achieve and maintain moderate conscious sedation.  The patient's heart rate, blood pressure, and oxygen saturation are monitored continuously during the procedure. The period of conscious sedation is 30 minutes, of which I was present face-to-face 100% of this time.  Findings:  Left Ventricle: Normal EF  Mitral Valve: Trace MR  Aortic Valve: Normal   Tricuspid Valve: Trace TR  Left Atrium: Normal, no thrombus  Bubble Contrast Study: negative. No PFO  No embolic source identified.   Donato SchultzMark Wally Shevchenko, MD

## 2018-02-08 NOTE — Progress Notes (Signed)
Nutrition Brief Note  RD consulted for diet education regarding Type 2 Diabetes Mellitus. Pt unavailable, undergoing TEE procedure. RD to provide diet education at re-visit.  Roslyn SmilingStephanie Nicola Heinemann, MS, RD, LDN Pager # [phone] After hours/ weekend pager # [phone]

## 2018-02-08 NOTE — Care Management Note (Signed)
Case Management Note  Patient Details  Name: Tivis RingerMaria Arana-Torres MRN: [id number] Date of Birth: May 22, 1960  Subjective/Objective:     Pt admitted with a stroke. She is from home with spouse.  Pt uses [facility] for PCP and pharmacy.                Action/Plan: [facility]. [facility]. [facility]. [facility]. Pt will not receive HH OT. Will update MD.  Family to provide transportation home.   Expected Discharge Date:  02/08/18               Expected Discharge Plan:  Home w Home Health Services  In-House Referral:     Discharge planning Services  CM Consult  Post Acute Care Choice:  Home Health Choice offered to:  Patient, Adult Children  DME Arranged:    DME Agency:     HH Arranged:  PT, OT, Social Work([facility]) [facility]  [facility] )  Status of Service:  Completed, signed off  If discussed at Long Length of Stay Meetings, dates discussed:    Additional Comments:  Kermit BaloKelli F Averlee Swartz, RN 02/08/2018, 4:17 PM

## 2018-02-08 NOTE — Discharge Summary (Addendum)
Stroke Discharge Summary  Patient ID: April Fry   MRN: [id number]      DOB: April 24, 1960  Date of Admission: 01/31/2018 Date of Discharge: 02/08/2018  Attending Physician:  Rosalin Hawking, MD, Stroke MD Consultant(s):    Neurosurgery, cardiology for TEE, pulmonary critical care, electrophysiology, Delice Lesch, MD (Physical Medicine & Rehabtilitation)  Patient's PCP:  Antony Blackbird, MD  DISCHARGE DIAGNOSIS:  Principal Problem:   Cerebellar stroke ([place]) Active Problems:   Diabetes mellitus ([place])   HTN (hypertension)   Hyperglycemia   Hypokalemia   Hyperlipidemia LDL goal <70   Class 1 obesity due to excess calories with serious comorbidity and body mass index (BMI) of 33.0 to 33.9 in adult   Past Medical History:  Diagnosis Date  . Diabetes mellitus without complication ([place])   . Hypertension   . Pneumonia 05/21/2016   Past Surgical History:  Procedure Laterality Date  . ABDOMINAL HYSTERECTOMY      Allergies as of 02/08/2018   No Known Allergies     Medication List    STOP taking these medications   ciclopirox 8 % solution Commonly known as:  PENLAC     TAKE these medications   amLODipine 10 MG tablet Commonly known as:  NORVASC Take 1 tablet (10 mg total) by mouth daily.   aspirin 325 MG EC tablet Take 1 tablet (325 mg total) by mouth daily. Start taking on:  February 09, 2018   blood glucose meter kit and supplies Kit Dispense based on patient and insurance preference. Use up to four times daily as directed. (FOR ICD-9 250.00, 250.01).   glucose blood test strip Commonly known as:  TRUE METRIX BLOOD GLUCOSE TEST Use as instructed   insulin glargine 100 UNIT/ML injection Commonly known as:  LANTUS Inject 0.16 mLs (16 Units total) into the skin at bedtime.   labetalol 200 MG tablet Commonly known as:  NORMODYNE Take 1 tablet (200 mg total) by mouth 2 (two) times daily.   losartan 100 MG tablet Commonly known as:  COZAAR Take 1 tablet (100 mg  total) by mouth daily.   metFORMIN 1000 MG tablet Commonly known as:  GLUCOPHAGE Take 1 tablet (1,000 mg total) by mouth 2 (two) times daily with a meal.   rosuvastatin 20 MG tablet Commonly known as:  CRESTOR Take 1 tablet (20 mg total) by mouth daily.   TRUE METRIX METER w/Device Kit Use to check blood sugar in the morning.   TRUEPLUS LANCETS 28G Misc Use to check blood sugar daily.       LABORATORY STUDIES CBC    Component Value Date/Time   WBC 8.0 02/08/2018 0455   RBC 4.46 02/08/2018 0455   HGB 12.8 02/08/2018 0455   HCT 39.1 02/08/2018 0455   PLT 262 02/08/2018 0455   MCV 87.7 02/08/2018 0455   MCH 28.7 02/08/2018 0455   MCHC 32.7 02/08/2018 0455   RDW 12.7 02/08/2018 0455   LYMPHSABS 1.1 01/31/2018 1035   MONOABS 0.3 01/31/2018 1035   EOSABS 0.0 01/31/2018 1035   BASOSABS 0.0 01/31/2018 1035   CMP    Component Value Date/Time   NA 138 02/08/2018 0455   NA 140 11/25/2017 1040   K 4.0 02/08/2018 0455   CL 102 02/08/2018 0455   CO2 27 02/08/2018 0455   GLUCOSE 164 (H) 02/08/2018 0455   BUN 14 02/08/2018 0455   BUN 11 11/25/2017 1040   CREATININE 0.51 02/08/2018 0455   CALCIUM 9.1 02/08/2018 0455  PROT 7.9 01/31/2018 1035   PROT 7.2 11/25/2017 1040   ALBUMIN 4.3 01/31/2018 1035   ALBUMIN 4.1 11/25/2017 1040   AST 26 01/31/2018 1035   ALT 20 01/31/2018 1035   ALKPHOS 67 01/31/2018 1035   BILITOT 1.7 (H) 01/31/2018 1035   BILITOT 0.4 11/25/2017 1040   GFRNONAA >60 02/08/2018 0455   GFRAA >60 02/08/2018 0455   COAGS Lab Results  Component Value Date   INR 1.06 01/31/2018   Lipid Panel    Component Value Date/Time   CHOL 158 02/01/2018 0011   CHOL 199 11/25/2017 1040   TRIG 69 02/01/2018 0011   HDL 49 02/01/2018 0011   HDL 48 11/25/2017 1040   CHOLHDL 3.2 02/01/2018 0011   VLDL 14 02/01/2018 0011   LDLCALC 95 02/01/2018 0011   LDLCALC 128 (H) 11/25/2017 1040   HgbA1C  Lab Results  Component Value Date   HGBA1C 8.9 (H) 02/01/2018    Urinalysis    Component Value Date/Time   COLORURINE YELLOW 01/31/2018 1042   APPEARANCEUR CLEAR 01/31/2018 1042   LABSPEC 1.015 01/31/2018 1042   PHURINE 6.5 01/31/2018 1042   GLUCOSEU >=500 (A) 01/31/2018 1042   HGBUR TRACE (A) 01/31/2018 1042   BILIRUBINUR NEGATIVE 01/31/2018 1042   BILIRUBINUR negative 11/25/2017 1029   KETONESUR 40 (A) 01/31/2018 1042   PROTEINUR 100 (A) 01/31/2018 1042   UROBILINOGEN 0.2 11/25/2017 1029   NITRITE NEGATIVE 01/31/2018 1042   LEUKOCYTESUR NEGATIVE 01/31/2018 1042   Urine Drug Screen     Component Value Date/Time   LABOPIA NONE DETECTED 01/31/2018 1042   COCAINSCRNUR NONE DETECTED 01/31/2018 1042   LABBENZ NONE DETECTED 01/31/2018 1042   AMPHETMU NONE DETECTED 01/31/2018 1042   THCU NONE DETECTED 01/31/2018 1042   LABBARB NONE DETECTED 01/31/2018 1042    Alcohol Level    Component Value Date/Time   ETH <10 01/31/2018 1034     SIGNIFICANT DIAGNOSTIC STUDIES Ct Angio Head W Or Wo Contrast  Result Date: 01/31/2018 CLINICAL DATA:  Unsteady gait. Vomiting. Blurred vision. Abnormal cerebellum by CT. EXAM: CT ANGIOGRAPHY HEAD AND NECK TECHNIQUE: Multidetector CT imaging of the head and neck was performed using the standard protocol during bolus administration of intravenous contrast. Multiplanar CT image reconstructions and MIPs were obtained to evaluate the vascular anatomy. Carotid stenosis measurements (when applicable) are obtained utilizing NASCET criteria, using the distal internal carotid diameter as the denominator. CONTRAST:  121m ISOVUE-370 IOPAMIDOL (ISOVUE-370) INJECTION 76% COMPARISON:  Head CT same day FINDINGS: CTA NECK FINDINGS Aortic arch: Aortic arch is normal except for minimal atherosclerotic change. Right carotid system: Common carotid artery widely patent to the bifurcation. Mild atherosclerotic plaque at the carotid bifurcation but no stenosis or irregularity. Cervical ICA widely patent. Left carotid system: Common carotid  artery widely patent to the bifurcation. Carotid bifurcation is normal without soft or calcified plaque. Cervical ICA is normal. Vertebral arteries: Both vertebral artery origins are widely patent. No subclavian stenosis proximal to the origins. Both vertebral artery origins appear widely patent through the cervical region to the foramen magnum. Skeleton: Normal Other neck: No mass or lymphadenopathy. Upper chest: Negative Review of the MIP images confirms the above findings CTA HEAD FINDINGS Anterior circulation: Both internal carotid arteries are widely patent through the skull base and siphon regions. The anterior and middle cerebral vessels appear normal without stenosis, aneurysm or vascular malformation. No missing branch vessels are identified. Posterior circulation: Both vertebral arteries are patent through the foramen magnum to the basilar. No vertebral stenosis  or irregularity. I think the posterior inferior cerebellar arteries show flow on each side presently. Superior cerebellar and posterior cerebral arteries show flow. Venous sinuses: Patent and normal. Anatomic variants: None significant. Delayed phase: No abnormal enhancement. 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 Flow is present in both posterior inferior cerebellar arteries presently. No basilar artery disease or other posterior circulation branch vessel abnormality. Electronically Signed   By: Nelson Chimes M.D.   On: 01/31/2018 13:10   Dg Chest 1 View  Result Date: 02/01/2018 CLINICAL DATA:  Central line repositioning. EXAM: CHEST  1 VIEW COMPARISON:  Chest radiograph performed earlier today at 7:16 p.m. FINDINGS: The patient's left IJ line is now seen ending about the cavoatrial junction. The lungs are mildly hypoexpanded. Mild vascular crowding and vascular congestion are seen.  Increased interstitial markings may reflect mild interstitial edema. There is no evidence of pleural effusion or pneumothorax. The cardiomediastinal silhouette is borderline enlarged. No acute osseous abnormalities are seen. IMPRESSION: 1. Left IJ line seen ending about the cavoatrial junction, in expected position. 2. Lungs mildly hypoexpanded. Mild vascular congestion and borderline cardiomegaly. Increased interstitial markings may reflect mild interstitial edema. Electronically Signed   By: Garald Balding M.D.   On: 02/01/2018 21:36   Ct Head Wo Contrast  Result Date: 02/04/2018 CLINICAL DATA:  Follow-up cerebellar stroke. EXAM: CT HEAD WITHOUT CONTRAST TECHNIQUE: Contiguous axial images were obtained from the base of the skull through the vertex without intravenous contrast. COMPARISON:  Brain MRI 02/03/2018 and CT 02/02/2018 FINDINGS: Brain: Edema related to a large acute right PICA territory infarct is unchanged with persistent partial effacement of the fourth ventricle. There is no evidence of associated obstructive hydrocephalus. Minimal petechial hemorrhage is again noted within the infarct. No malignant hemorrhagic transformation or new infarct is identified. Vascular: No hyperdense vessel. Skull: No fracture or focal osseous lesion. Sinuses/Orbits: Visualized paranasal sinuses and mastoid air cells are clear. Orbits are unremarkable. Other: None. IMPRESSION: Unchanged appearance of large right PICA territory infarct with associated edema, petechial hemorrhage, and partial effacement of the fourth ventricle. No obstructive hydrocephalus. Electronically Signed   By: Logan Bores M.D.   On: 02/04/2018 17:07   Ct Head Wo Contrast  Result Date: 02/02/2018 CLINICAL DATA:  Stroke follow-up EXAM: CT HEAD WITHOUT CONTRAST TECHNIQUE: Contiguous axial images were obtained from the base of the skull through the vertex without intravenous contrast. COMPARISON:  CT head 02/02/2018, MRI 01/31/2018 FINDINGS:  Brain: Right PICA infarct shows progressive edema and mass-effect. Effacement of the fourth ventricle without hydrocephalus. No hemorrhage. Progressive low-density in the left cerebellum mostly in the region of the brachium pontis. Question edema. No infarct is seen on recent MRI in this territory. Negative for acute infarct or hydrocephalus. Vascular: Negative for hyperdense vessel Skull: Negative Sinuses/Orbits: Negative Other: None IMPRESSION: Progressive edema and mass-effect due to right PICA infarct. No hemorrhage Progressive low density in the left brachium pontis which may be edema. No infarct in this area on recent MRI. Electronically Signed   By: Franchot Gallo M.D.   On: 02/02/2018 17:10   Ct Head Wo Contrast  Result Date: 02/02/2018 CLINICAL DATA:  Follow up stroke. EXAM: CT HEAD WITHOUT CONTRAST TECHNIQUE: Contiguous axial images were obtained from the base of the skull through the vertex without intravenous contrast. COMPARISON:  CT HEAD February 01, 2018 FINDINGS: BRAIN: RIGHT inferior cerebellar  evolving infarct with increased edema, downward herniation with effaced foramen of Magendie and Luschka. Minimal suspected petechial hemorrhage though limited by streak artifact from skull-base. No hydrocephalus. No supratentorial infarct, mass effect or midline shift. No abnormal extra-axial fluid collections. VASCULAR: Unremarkable. SKULL/SOFT TISSUES: No skull fracture. No significant soft tissue swelling. ORBITS/SINUSES: The included ocular globes and orbital contents are normal.Trace paranasal sinus mucosal thickening. Mastoid air cells are well aerated. OTHER: None. IMPRESSION: 1. Evolving RIGHT PICA territory infarct with worsening edema (potentially cytotoxic) and minimal suspected petechial edema. No obstructive hydrocephalus though increasingly effaced foramen magnum at risk for hydrocephalus. 2. These results will be called to the ordering clinician or representative by the professional  radiologist assistant, and communication documented in [employer]. Electronically Signed   By: Elon Alas M.D.   On: 02/02/2018 04:28   Ct Head Wo Contrast  Result Date: 02/01/2018 CLINICAL DATA:  Followup cerebellar infarction. EXAM: CT HEAD WITHOUT CONTRAST TECHNIQUE: Contiguous axial images were obtained from the base of the skull through the vertex without intravenous contrast. COMPARISON:  Multiple studies 01/31/2018 FINDINGS: Brain: Acute infarction within the inferior cerebellum on the right shows continued increase in low-density and swelling. No evidence of hemorrhage. Swallow and cerebellar tonsil protrudes through the foramen magnum. Mass-effect upon the fourth ventricle but without evidence of change in size of the lateral or third ventricles. Cerebral hemispheres continue to show a normal appearance. Vascular: No abnormal vascular finding by CT. Skull: Negative Sinuses/Orbits: Clear/normal Other: None IMPRESSION: Slight progression of low-density and swelling within the inferior cerebellum on the right consistent with posterior inferior cerebellar artery territory infarction. Right cerebellar tonsil protrudes through the foramen magnum. Mass-effect upon the fourth ventricle but no sign obstruction or change in ventricular size. No sign of bleeding. Electronically Signed   By: Nelson Chimes M.D.   On: 02/01/2018 10:04   Ct Head Wo Contrast  Result Date: 01/31/2018 CLINICAL DATA:  Follow-up infarct. History of head and neck cancer, hypertension and diabetes. EXAM: CT HEAD WITHOUT CONTRAST TECHNIQUE: Contiguous axial images were obtained from the base of the skull through the vertex without intravenous contrast. COMPARISON:  None. FINDINGS: BRAIN: Wedge-like RIGHT inferior cerebellar hypodensity with regional mass effect, narrowed though patent fourth ventricle. No intraparenchymal hemorrhage, nor midline shift. The ventricles and sulci are normal. No acute large vascular territory  infarcts. No abnormal extra-axial fluid collections. Basal cisterns are patent. VASCULAR: Unremarkable. SKULL/SOFT TISSUES: No skull fracture. No significant soft tissue swelling. ORBITS/SINUSES: The included ocular globes and orbital contents are normal.Trace paranasal sinus mucosal thickening. Mastoid air cells are well aerated. OTHER: None. IMPRESSION: Evolving acute RIGHT PICA territory infarct without hemorrhagic conversion. Regional mass effect without obstructive hydrocephalus. Electronically Signed   By: Elon Alas M.D.   On: 01/31/2018 22:07   Ct Head Wo Contrast  Result Date: 01/31/2018 CLINICAL DATA:  Altered level of consciousness. Elevated glucose and decreased mental status. EXAM: CT HEAD WITHOUT CONTRAST TECHNIQUE: Contiguous axial images were obtained from the base of the skull through the vertex without intravenous contrast. COMPARISON:  None. FINDINGS: Brain: There is a large area of low-attenuation involving the right cerebral hemisphere which measures 3.8 x 4.1 cm, image 5/3. There is mass effect with shift of the midline to the left by approximately 5 mm, image 5/3. The cerebral hemispheres appear normal. The ventricular volumes are unremarkable. There is no evidence for acute intracranial hemorrhage. Vascular: No hyperdense vessel or unexpected calcification. Skull: Normal. Negative for fracture or focal lesion. Sinuses/Orbits: Paranasal sinuses  and mastoid air cells are clear. Other: None. IMPRESSION: 1. Large area of low density within the right cerebral hemisphere is identified compatible with infarct. This is likely subacute in nature and involves much of the entire right PICA territory. Further investigation with CTA of the head and neck and brain MRI is advised. Electronically Signed   By: Kerby Moors M.D.   On: 01/31/2018 11:19   Ct Angio Neck W And/or Wo Contrast  Result Date: 01/31/2018 CLINICAL DATA:  Unsteady gait. Vomiting. Blurred vision. Abnormal cerebellum by  CT. EXAM: CT ANGIOGRAPHY HEAD AND NECK TECHNIQUE: Multidetector CT imaging of the head and neck was performed using the standard protocol during bolus administration of intravenous contrast. Multiplanar CT image reconstructions and MIPs were obtained to evaluate the vascular anatomy. Carotid stenosis measurements (when applicable) are obtained utilizing NASCET criteria, using the distal internal carotid diameter as the denominator. CONTRAST:  160m ISOVUE-370 IOPAMIDOL (ISOVUE-370) INJECTION 76% COMPARISON:  Head CT same day FINDINGS: CTA NECK FINDINGS Aortic arch: Aortic arch is normal except for minimal atherosclerotic change. Right carotid system: Common carotid artery widely patent to the bifurcation. Mild atherosclerotic plaque at the carotid bifurcation but no stenosis or irregularity. Cervical ICA widely patent. Left carotid system: Common carotid artery widely patent to the bifurcation. Carotid bifurcation is normal without soft or calcified plaque. Cervical ICA is normal. Vertebral arteries: Both vertebral artery origins are widely patent. No subclavian stenosis proximal to the origins. Both vertebral artery origins appear widely patent through the cervical region to the foramen magnum. Skeleton: Normal Other neck: No mass or lymphadenopathy. Upper chest: Negative Review of the MIP images confirms the above findings CTA HEAD FINDINGS Anterior circulation: Both internal carotid arteries are widely patent through the skull base and siphon regions. The anterior and middle cerebral vessels appear normal without stenosis, aneurysm or vascular malformation. No missing branch vessels are identified. Posterior circulation: Both vertebral arteries are patent through the foramen magnum to the basilar. No vertebral stenosis or irregularity. I think the posterior inferior cerebellar arteries show flow on each side presently. Superior cerebellar and posterior cerebral arteries show flow. Venous sinuses: Patent and  normal. Anatomic variants: None significant. Delayed phase: No abnormal enhancement. 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 Flow is present in both posterior inferior cerebellar arteries presently. No basilar artery disease or other posterior circulation branch vessel abnormality. Electronically Signed   By: MNelson ChimesM.D.   On: 01/31/2018 13:10   Mr Brain Wo Contrast  Result Date: 02/03/2018 CLINICAL DATA:  Follow-up cerebellar infarcts. History of hypertension. EXAM: MRI HEAD WITHOUT CONTRAST TECHNIQUE: Multiplanar, multiecho pulse sequences of the brain and surrounding structures were obtained without intravenous contrast. COMPARISON:  CT HEAD February 02, 2018 and MRI of the head January 31, 2018 FINDINGS: INTRACRANIAL CONTENTS: With low ADC values. No LEFT cerebellar reduced diffusion. RIGHT inferior cerebellar punctate susceptibility artifact compatible with petechial hemorrhage. Similar edema and regional mass effect partially effacing the fourth ventricle. No obstructive hydrocephalus. A few supratentorial punctate white matter FLAIR T2 hyperintensities compatible with mild chronic small vessel ischemic change. No intraparenchymal mass of or supratentorial mass effect. No abnormal extra-axial fluid collections. VASCULAR: Normal major intracranial vascular flow voids present at skull base. SKULL AND UPPER CERVICAL SPINE: No abnormal sellar expansion. No suspicious calvarial bone marrow signal. Craniocervical junction maintained. SINUSES/ORBITS: The mastoid air-cells and included paranasal  sinuses are well-aerated.The included ocular globes and orbital contents are non-suspicious. OTHER: None. IMPRESSION: 1. Evolving acute RIGHT cerebellar PICA territory infarct with petechial hemorrhage. Partially effaced fourth ventricle without obstructive  hydrocephalus. 2. No LEFT brachium pontis abnormality. Electronically Signed   By: Elon Alas M.D.   On: 02/03/2018 03:54   Mr Brain Wo Contrast  Result Date: 01/31/2018 CLINICAL DATA:  57 year old female with unsteady gait, vomiting, evidence of right cerebellar infarct on CT today. EXAM: MRI HEAD WITHOUT CONTRAST TECHNIQUE: Multiplanar, multiecho pulse sequences of the brain and surrounding structures were obtained without intravenous contrast. COMPARISON:  CT head and CTA head and neck earlier today. FINDINGS: Brain: Confluent restricted diffusion in the right inferior cerebellum, PICA territory extending from the vermis to the posterolateral surface up to 5.5 centimeters. Estimated infarct volume is 26 mL. No involvement of the brainstem is evident. Cytotoxic edema and petechial hemorrhage (series 8, image 5). Overall mild posterior fossa mass effect including partial effacement of the 4th ventricle. However, lateral and 3rd ventricle size remains normal. No other restricted diffusion. No supratentorial mass effect. No extra-axial collection. Scattered mild to moderate for age nonspecific subcortical cerebral white matter T2 and FLAIR hyperintensity. Negative deep gray matter nuclei. Negative pituitary. Vascular: Major intracranial vascular flow voids are preserved. Skull and upper cervical spine: Negative visible cervical spine and spinal cord. Visualized bone marrow signal is within normal limits. Sinuses/Orbits: Negative orbits. Paranasal sinuses and mastoids are stable and well pneumatized. Other: Scalp and face soft tissues are negative. IMPRESSION: 1. Moderate size acute Right PICA cerebellar infarct with cytotoxic edema and petechial hemorrhage. Mild posterior fossa mass effect including partial effacement of the 4th ventricle, but no ventriculomegaly or malignant hemorrhagic transformation. 2. No brainstem infarct or other acute intracranial abnormality. 3. Mild to moderate for age  nonspecific cerebral white matter signal changes. Study discussed by telephone with Dr. Samara Snide on 01/31/2018 at 16:06 . Electronically Signed   By: Genevie Ann M.D.   On: 01/31/2018 16:06   Dg Chest Port 1 View  Result Date: 02/01/2018 CLINICAL DATA:  Cerebellar stroke.  Central line placement. EXAM: PORTABLE CHEST 1 VIEW COMPARISON:  02/01/2018 FINDINGS: New left jugular central venous catheter is seen with tip overlying the proximal to mid right atrium. No evidence of pneumothorax. Stable mild cardiomegaly. Diffuse pulmonary vascular congestion shows no significant change. No evidence of pulmonary airspace disease or pleural effusion. IMPRESSION: Left jugular central venous catheter tip overlies the proximal to mid right atrium. No evidence of pneumothorax. Stable mild cardiomegaly and pulmonary vascular congestion. Electronically Signed   By: Earle Gell M.D.   On: 02/01/2018 19:47   Dg Chest Port 1 View  Result Date: 02/01/2018 CLINICAL DATA:  Stroke. EXAM: PORTABLE CHEST 1 VIEW COMPARISON:  01/31/2018. FINDINGS: Cardiomegaly. Mild vascular congestion. No consolidation or edema. Similar appearance to priors. IMPRESSION: Cardiomegaly with mild vascular congestion. Electronically Signed   By: Staci Righter M.D.   On: 02/01/2018 08:31   Dg Chest Portable 1 View  Result Date: 01/31/2018 CLINICAL DATA:  Altered mental status EXAM: PORTABLE CHEST 1 VIEW COMPARISON:  06/03/2016 FINDINGS: Heart is borderline in size. Mild vascular congestion. Bibasilar atelectasis. No effusions or overt edema. No acute bony abnormality. IMPRESSION: Borderline heart size with vascular congestion and bibasilar atelectasis. Electronically Signed   By: Rolm Baptise M.D.   On: 01/31/2018 11:20   Vas US Carotid (at [place] Only)  Result Date: 02/01/2018 Carotid Arterial Duplex Study Indications: CVA. Limitations: body habitus  Performing Technologist: Antonieta Pert RDMS, RVT  Examination Guidelines: A complete  evaluation includes B-mode imaging, spectral Doppler, color Doppler, and power Doppler as needed of all accessible portions of each vessel. Bilateral testing is considered an integral part of a complete examination. Limited examinations for reoccurring indications may be performed as noted.  Right Carotid Findings: +----------+--------+--------+--------+--------+--------+           PSV cm/sEDV cm/sStenosisDescribeComments +----------+--------+--------+--------+--------+--------+ CCA Prox  85      12                               +----------+--------+--------+--------+--------+--------+ CCA Distal75      17                               +----------+--------+--------+--------+--------+--------+ ICA Prox  58      16                               +----------+--------+--------+--------+--------+--------+ ICA Mid   81      20                               +----------+--------+--------+--------+--------+--------+ ICA Distal87      26                               +----------+--------+--------+--------+--------+--------+ ECA       129     13                               +----------+--------+--------+--------+--------+--------+ +----------+--------+-------+--------+-------------------+           PSV cm/sEDV cmsDescribeArm Pressure (mmHG) +----------+--------+-------+--------+-------------------+ [id number]                                        +----------+--------+-------+--------+-------------------+ +---------+--------+--+--------+--+---------+ VertebralPSV cm/s49EDV cm/s12Antegrade +---------+--------+--+--------+--+---------+  Left Carotid Findings: +----------+--------+--------+--------+--------+--------+           PSV cm/sEDV cm/sStenosisDescribeComments +----------+--------+--------+--------+--------+--------+ CCA Prox  101     9                                +----------+--------+--------+--------+--------+--------+ CCA Distal73       12                               +----------+--------+--------+--------+--------+--------+ ICA Prox  78      16                               +----------+--------+--------+--------+--------+--------+ ICA Mid   90      23                               +----------+--------+--------+--------+--------+--------+ ICA Distal93      27                               +----------+--------+--------+--------+--------+--------+  ECA       257     27      >50%                     +----------+--------+--------+--------+--------+--------+ +----------+--------+--------+--------+-------------------+ SubclavianPSV cm/sEDV cm/sDescribeArm Pressure (mmHG) +----------+--------+--------+--------+-------------------+           165                                         +----------+--------+--------+--------+-------------------+ +---------+--------+--+--------+--+---------+ VertebralPSV cm/s69EDV cm/s13Antegrade +---------+--------+--+--------+--+---------+  Summary: Right Carotid: Velocities in the right ICA are consistent with a 1-39% stenosis. Left Carotid: Velocities in the left ICA are consistent with a 1-39% stenosis.               The ECA appears <50% stenosed. Vertebrals: Bilateral vertebral arteries demonstrate antegrade flow. *See table(s) above for measurements and observations.  Electronically signed by Harold Barban MD on 02/01/2018 at 10:56:32 AM.    Final      Carotid Doppler  There is 1-39% bilateral ICA stenosis. Vertebral artery flow is antegrade.   2D echocardiogram - Left ventricle: The cavity size was normal. There was mildconcentric hypertrophy. Systolic function was normal. Theestimated ejection fraction was in the range of 55% to 60%.Wallmotion was normal; there were no regional wall motionabnormalities. Doppler parameters are consistent with abnormalleft ventricular relaxation (grade 1 diastolic dysfunction).Doppler parameters are consistent  with indeterminate ventricularfilling pressure. - Aorta: Ascending aortic diameter: 38 mm (S). - Ascending aorta: The ascending aorta was mildly dilated. - Mitral valve: Transvalvular velocity was within the normal range.There was no evidence for stenosis. There was trivial regurgitation. - Right ventricle: The cavity size was normal. Wall thickness wasnormal. Systolic function was normal. - Tricuspid valve: There was trivial regurgitation. - Pulmonary arteries: Systolic pressure was within the normalrange. PA peak pressure: 32 mm Hg (S).  TEE   Left Ventricle:Normal EF Mitral Valve:Trace MR Aortic Valve:Normal Tricuspid Valve:Trace TR Left Atrium:Normal, no thrombus Bubble Contrast Study:negative. No PFO No embolic source identified.     HISTORY OF PRESENT ILLNESS April Fry is a 57 y.o. female with history of hypertension, diabetes.  Per daughter patient was normal until approximately 2100 hrs on 01/30/2018 when she and her husband had not eaten she stated that she did not feel well.  At that time she was having difficulty walking, had nausea vomiting and stated the room was spinning.  At the time she went to bed.  She awoke and was having the same symptoms per daughter.  At approximately 0900 hrs. this morning 01/31/2018 she was noted to have a significant change in mental status.  For that reason patient was brought to the emergency department.  CT scan showed a large area of low density within the right cerebellar hemisphere compatible with infarct.  The nature of this infarct is subacute and involves much of the entire right PICA territory.  The infarct measures 3.8 x 4.1 cm.  Patient currently is very lethargic and follows only minimal commands. She was not a tpa candidate as she was out of the treatment window. Premorbid modified Rankin scale (mRS): 0. NIH stroke scale of 17. She was admitted to the neuro ICU for further evalution and treatment. Neurosurgery was  consulted.   Chilton a 57 y.o.femalewith history ofHTN and diabetes presenting with nausea, vomiting, dizziness and unsteady gait.   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 Trenton Gammon. While there was increasing edema, not a candidate for surgery   CCM consulted in ICU to assist with medical management  CT head 01/31/18 large R cerebral infarct R PICA territory  CTA head & neck unremarkable   MRI 12/23/19mod R PICA cerebellar infarct w/ cytotoxic edema and petechial hmg. Mild effacement 4th ventricle.    Serial CT head x 5 evolving R PICA infarct w/o HT. Regional mass effect w/o hydrocephalus.   Repeat MRI right PICA infarct with fourth ventricle effacement, no hydrocephalus  carotid DopplerB ICA 1-39% stenosis, VAs antegrade   2D Echo EF 55-60%. No source of embolus   TEE no SOE  Given palpitations associated with fatigue, cardiology recommends starting with 30d monitor; hold loop for now  LDL95  HgbA1c8.9  HIV/RPR negative  No antithromboticprior to admission, now on aspirin 325 mg daily. Continue at d/c  Therapy recommendations: Home with [facility] - improved from initially recommended CIR. Will progress to OP as needed  Disposition: d/c home  Cerebral edema, resolved Induced Hypernatremia, resolved  CT with edema but now stable, no hydro  Neurosurgery followed during ICU stay with no need for intervention  Treated with 3% saline   Headache, resolved  Secondary to stroke, edema  Pain management measures, none needed for past 3-4 days  Hypertension  Home Meds: norvasc 10, cozaar 100  BP stable   On amlodipine 10, labetalol 200 bid, losartan 50 bid - continue at d/c  BP goal  normotensive  Monitor   Hyperlipidemia  Home meds: crestor 20  LDL 95, goal < 70  resumed home crestor in hospital  Continue statin at discharge  Diabetes type II  HgbA1c 8.9, goal < 7.0  Home meds: lantus 16 hs, glucophage 1000 bid  on insulin drip for a short period while in ICU  In hospital with intermittent hyperglycemia, now stable  Continue home meds at d/c  OP follow up  Other Stroke Risk Factors  ETOH use, advised to drink no more than 1 drink(s) a day  Obesity,Body mass index is 33.55 kg/m., recommend weight loss, diet and exercise as appropriate   Hypokalemia, resolved 2.8-> 3.0->3.3->5.0-<4   DISCHARGE EXAM Blood pressure 120/70, pulse 68, temperature 98.6 F (37 C), temperature source Oral, resp. rate 16, height _0  (1.575 m), weight 83.2 kg, SpO2 98 %.  NAD, pleasant, primarily Spanish speaking though understands some English Speech: Speech is normal; fluent and spontaneous with normal comprehension. No dysarthria Cognition: The patient is oriented to person, place, and time. Recent and remote memory intact. Cranial Nerves: The pupils are equal, round, and reactive to light. Trigeminal sensation is intact and the muscles of mastication are normal. The face is symmetric. The palate elevates in the midline. Hearing intact. Voice is normal. Shoulder shrug is normal. The tongue has normal motion without fasciculations.  Coordination: Intact bilaterally Motor Observation: No asymmetry, no atrophy, and no involuntary movements noted. Tone: Normal muscle tone.  Strength: Strength is 5/5 in the upper and lower limbs. No drift. No orbiting  Sensation: intact to touch, tempaerature  Discharge Diet   Carb modified, heart healthy thin liquids  DISCHARGE PLAN  Disposition:  D/c home  Home health PT and OT  aspirin 325 mg daily for secondary stroke prevention.  Ongoing risk factor control by  Primary Care Physician at  time of discharge  Follow-up Fulp, Cammie, MD in 2 weeks.  Follow-up Cardiology - high suspicion for AF, for 30d monitor  Follow-up in [facility] in 4 weeks, office to schedule an appointment.   45 minutes were spent preparing discharge.  Burnetta Sabin, MSN, APRN, ANVP-BC, AGPCNP-BC Advanced Practice Stroke Nurse [place] for Schedule & Pager information 02/08/2018 3:24 PM    ATTENDING NOTE: I reviewed above note and agree with the assessment and plan. Pt was seen and examined.   No acute event overnight.  Patient neurological stable.  Had TEE today, unremarkable, no PFO.  EP discussed with patient consider 30-day cardio event monitoring as outpatient is more appropriate.  We will set up.  Patient is ready for discharge.  Continue aspirin 325 for stroke prevention.  Continue home dose Crestor.  Stroke risk factor modification.  PT/OT recommend home PT/OT.  She will follow-up with stroke clinic at [facility] in 4 weeks.  Rosalin Hawking, MD PhD Stroke Neurology 02/08/2018 6:23 PM

## 2018-02-08 NOTE — Progress Notes (Signed)
  Echocardiogram Echocardiogram Transesophageal has been performed.  April Fry  [name] 02/08/2018, 12:52 PM

## 2018-02-08 NOTE — Progress Notes (Signed)
IP rehab admissions - Noted [facility]  Patient able to ambulate 500 feet with minguard assist.  [facility] when she is medically ready.  Call me for questions.  [phone]

## 2018-02-08 NOTE — Plan of Care (Signed)
Pt has completed and met goals, pt is stable with no new concerns. No signs and symptoms of distress pt on room air and will d/c home with family 

## 2018-02-09 ENCOUNTER — Encounter (HOSPITAL_COMMUNITY): Payer: Self-pay | Admitting: Cardiology

## 2018-02-10 MED FILL — $LANTUS 100 UNITS/ML VIAL: 100 | 84 days supply | Qty: 30 | Fill #0

## 2018-02-10 MED FILL — LABETALOL HCL 200 MG TABLET: 200 | 30 days supply | Qty: 60 | Fill #0

## 2018-02-14 ENCOUNTER — Telehealth: Payer: Self-pay

## 2018-02-14 NOTE — Telephone Encounter (Signed)
Signed start of care orders faxed to [facility]

## 2018-02-15 ENCOUNTER — Telehealth: Payer: Self-pay | Admitting: Family Medicine

## 2018-02-15 NOTE — Telephone Encounter (Signed)
Medical Assistant left message on patient's home and cell voicemail. Voicemail states to give a call back to [place] with [facility] at [phone]. MA provided verbal orders for patients PT/Skilled Nursing/Social Work.

## 2018-02-15 NOTE — Telephone Encounter (Signed)
April Fry with [facility] wanted to request verbal orders for the following for the patient. Please follow up.  Continue PT 1 time a week for 5 weeks to help with balance  Would like to see if skilled nursing could be added for medication and DM   Social worker to help [employer] and other resources

## 2018-02-17 ENCOUNTER — Telehealth: Payer: Self-pay | Admitting: Family Medicine

## 2018-02-17 MED FILL — AMLODIPINE BESYLATE 10 MG T: 10 | 30 days supply | Qty: 30 | Fill #1

## 2018-02-17 MED FILL — ROSUVASTATIN CALCIUM 20 MG: 20 | 30 days supply | Qty: 30 | Fill #1

## 2018-02-17 MED FILL — metFORMIN HCL 1000 MG TABS: 1000 | 30 days supply | Qty: 60 | Fill #1

## 2018-02-17 NOTE — Telephone Encounter (Addendum)
Per daughter called states BP 280 then thirty minutes later it was 300. States home nurse advised her to take pt to hospital. Ph [phone] [place] daughter states pt refuses to go to hospital and wants to see [facility] and "take a pill to see if it helps". Daughter speaks [place]. Advised daughter to follow instructions of home nurse to go to hospital ED, daughter insists speak to provider about medications possibly helping.

## 2018-02-17 NOTE — Telephone Encounter (Signed)
Nurse from Home care called to request verbal orders for skilled Nursing  -1w1 -2w4 -1 PRN  She would like to report the findings  Blood sugar levels were 283,and 287 Temperature-99.8 degrees Please follow up [phone]

## 2018-02-23 NOTE — Telephone Encounter (Signed)
This should have been discussed with a provider in the office (as I was not providing patient care today) as patient was recently discharged from the hospital due to a stroke. Patient needs to return to the ED/hospital if her blood pressure is that high

## 2018-02-23 NOTE — Telephone Encounter (Signed)
Please advise on home health verbal order.

## 2018-02-24 ENCOUNTER — Ambulatory Visit (INDEPENDENT_AMBULATORY_CARE_PROVIDER_SITE_OTHER): Payer: Self-pay

## 2018-02-24 ENCOUNTER — Other Ambulatory Visit: Payer: Self-pay | Admitting: Physician Assistant

## 2018-02-24 DIAGNOSIS — I639 Cerebral infarction, unspecified: Secondary | ICD-10-CM

## 2018-02-24 DIAGNOSIS — R002 Palpitations: Secondary | ICD-10-CM

## 2018-02-24 DIAGNOSIS — I4891 Unspecified atrial fibrillation: Secondary | ICD-10-CM

## 2018-02-24 NOTE — Telephone Encounter (Signed)
Please advise to check blood pressure again today and if it is that elevated she would need to go to the ED.

## 2018-02-24 NOTE — Telephone Encounter (Signed)
Spoke to patient's daughter in reference to her message that was left 02/17/2018. Per patient she called with concerns about her mother's blood sugars. Her blood sugars were elevated at 280 and 300's. She was missing medications. Since then, patient has been taking insulin and pills for DM.   She does not have a way to monitor her blood pressure but was encouraged to get a simple monitor from [employer] to check BP daily to make sure the medications she takes are effective.  Also education provided about diet. Encourage patient to drink more water especially when she has higher blood sugar readings, decrease intake of white bread and rice in exchange of whole wheat bread. Pt eats white bread and rice and drinks juice.  Home health nurse is expected to go to here home on Tuesday. Pt reminded of upcoming appointment with PCP on 03/03/2018.

## 2018-02-25 NOTE — Telephone Encounter (Signed)
reviewed

## 2018-03-03 ENCOUNTER — Encounter: Payer: Self-pay | Admitting: Family Medicine

## 2018-03-03 ENCOUNTER — Ambulatory Visit: Payer: Self-pay | Attending: Family Medicine | Admitting: Family Medicine

## 2018-03-03 VITALS — BP 130/78 | HR 75 | Temp 98.7°F | Ht 60.0 in | Wt 193.0 lb

## 2018-03-03 DIAGNOSIS — Z8673 Personal history of transient ischemic attack (TIA), and cerebral infarction without residual deficits: Secondary | ICD-10-CM | POA: Insufficient documentation

## 2018-03-03 DIAGNOSIS — I63542 Cerebral infarction due to unspecified occlusion or stenosis of left cerebellar artery: Secondary | ICD-10-CM

## 2018-03-03 DIAGNOSIS — R51 Headache: Secondary | ICD-10-CM | POA: Insufficient documentation

## 2018-03-03 DIAGNOSIS — M792 Neuralgia and neuritis, unspecified: Secondary | ICD-10-CM

## 2018-03-03 DIAGNOSIS — Z79899 Other long term (current) drug therapy: Secondary | ICD-10-CM | POA: Insufficient documentation

## 2018-03-03 DIAGNOSIS — R42 Dizziness and giddiness: Secondary | ICD-10-CM | POA: Insufficient documentation

## 2018-03-03 DIAGNOSIS — Z09 Encounter for follow-up examination after completed treatment for conditions other than malignant neoplasm: Secondary | ICD-10-CM

## 2018-03-03 DIAGNOSIS — Z7984 Long term (current) use of oral hypoglycemic drugs: Secondary | ICD-10-CM | POA: Insufficient documentation

## 2018-03-03 DIAGNOSIS — Z7901 Long term (current) use of anticoagulants: Secondary | ICD-10-CM | POA: Insufficient documentation

## 2018-03-03 DIAGNOSIS — E1165 Type 2 diabetes mellitus with hyperglycemia: Secondary | ICD-10-CM

## 2018-03-03 DIAGNOSIS — I639 Cerebral infarction, unspecified: Secondary | ICD-10-CM

## 2018-03-03 DIAGNOSIS — I10 Essential (primary) hypertension: Secondary | ICD-10-CM

## 2018-03-03 DIAGNOSIS — M79662 Pain in left lower leg: Secondary | ICD-10-CM | POA: Insufficient documentation

## 2018-03-03 DIAGNOSIS — Z7982 Long term (current) use of aspirin: Secondary | ICD-10-CM | POA: Insufficient documentation

## 2018-03-03 DIAGNOSIS — M79622 Pain in left upper arm: Secondary | ICD-10-CM | POA: Insufficient documentation

## 2018-03-03 DIAGNOSIS — M542 Cervicalgia: Secondary | ICD-10-CM | POA: Insufficient documentation

## 2018-03-03 DIAGNOSIS — Z794 Long term (current) use of insulin: Secondary | ICD-10-CM | POA: Insufficient documentation

## 2018-03-03 LAB — GLUCOSE, POCT (MANUAL RESULT ENTRY): POC Glucose: 146 mg/dL — AB (ref 70–99)

## 2018-03-03 MED ORDER — AMLODIPINE BESYLATE 10 MG PO TABS: 10.0000 mg | ORAL_TABLET | Freq: Every day | ORAL | 11 refills | Status: DC

## 2018-03-03 MED ORDER — LOSARTAN POTASSIUM 50 MG PO TABS: 50.0000 mg | ORAL_TABLET | Freq: Every day | ORAL | 11 refills | Status: DC

## 2018-03-03 MED ORDER — GLIPIZIDE ER 5 MG PO TB24: 5.0000 mg | ORAL_TABLET | Freq: Every day | ORAL | 6 refills | Status: DC

## 2018-03-03 MED ORDER — GABAPENTIN 100 MG PO CAPS: ORAL_CAPSULE | ORAL | 11 refills | Status: DC

## 2018-03-03 MED ORDER — TRAMADOL HCL 50 MG PO TABS: 50.0000 mg | ORAL_TABLET | Freq: Two times a day (BID) | ORAL | 1 refills | Status: DC | PRN

## 2018-03-03 MED ORDER — LABETALOL HCL 200 MG PO TABS: 200.0000 mg | ORAL_TABLET | Freq: Two times a day (BID) | ORAL | 11 refills | Status: DC

## 2018-03-03 MED FILL — LOSARTAN POTASSIUM 50 MG TA: 50 | 30 days supply | Qty: 30 | Fill #0

## 2018-03-03 MED FILL — GABAPENTIN 100 MG CAPSULE: 100 | 30 days supply | Qty: 120 | Fill #0

## 2018-03-03 MED FILL — traMADol HCL 50 MG TABS: 50 | 30 days supply | Qty: 60 | Fill #0

## 2018-03-03 MED FILL — glipiZIDE XL 5 MG TB24: 5 | 30 days supply | Qty: 30 | Fill #0

## 2018-03-03 NOTE — Progress Notes (Signed)
Subjective:    Patient ID: April Fry, female    DOB: August 15, 1960, 58 y.o.   MRN: [id number]   Due to a slight language barrier, patient is accompanied at today's visit by her adult daughter who helps with translation.  Video interpretation system was offered but declined by patient and her daughter.  HPI       58 year old female with history of diabetes and hypertension who is status post hospitalization from 01/31/2018 through 02/08/2018 secondary to a cerebellar stroke.  Patient reports that since the stroke, she still feels dizzy and off balance.  Patient uses a cane for ambulation but if she is at home she often will walk and lean one hand against the wall or hold onto furniture to keep her balance.  Patient states that she often gets a headache on the left side of her head which is dull and aching and can then radiate towards the back of her neck and scalp if she does not take anything to help with the headache.  Patient states that she has been taking Tylenol for the headache and this does seem to help.  Patient states that the home health nurse has asked that a stronger medication be prescribed for patient to take when needed.  Patient reports that she also has a sharp, burning sensation on the left side of her body that feels like an inside type pain.  Patient states that the pain ranges from about a 6 to greater than a 10 on a 0-to-10 scale.  Patient states that nothing so far has really helped with this internal pain.  Patient states that the pain is in the left head, neck, upper arm, lower leg and entire left side of the body and has occurred since she had the stroke.     Patient has been checking her blood sugars with her home glucometer and morning blood sugars are generally at 150 or less fasting.  Patient has also now purchased a home blood pressure monitor and her daughter monitored the blood pressure and pulse.  Patient did recently have one episode of greatly elevated blood  pressure but since that time blood pressures are generally in the 150s to 140s over low 80s to low 90s.  Patient does not believe that her headaches are related to her blood pressure.  Patient denies any chest pain or palpitations, no shortness of breath or cough.  Patient denies any difficulty swallowing.  Patient denies urinary frequency or dysuria.  No fever or chills.  Past Medical History:  Diagnosis Date  . Diabetes mellitus without complication ([place])   . Hypertension   . Pneumonia 05/21/2016   Past Surgical History:  Procedure Laterality Date  . ABDOMINAL HYSTERECTOMY    . TEE WITHOUT CARDIOVERSION N/A 02/08/2018   Procedure: TRANSESOPHAGEAL ECHOCARDIOGRAM (TEE);  Surgeon: Jerline Pain, MD;  Location: [facility] ENDOSCOPY;  Service: Cardiovascular;  Laterality: N/A;   Social History   Tobacco Use  . Smoking status: Never Smoker  . Smokeless tobacco: Never Used  Substance Use Topics  . Alcohol use: Yes    Comment: on rare occasions.   . Drug use: No   Family History  Problem Relation Age of Onset  . Other Mother        parents passed away in an [place] in 23's  . Heart attack Sister   . Obesity Sister   No Known Allergies          Review of Systems  Constitutional: Positive for activity  change and fatigue (improving). Negative for chills and fever.  HENT: Negative for hearing loss and trouble swallowing.   Eyes: Negative for photophobia and visual disturbance.  Respiratory: Negative for cough and shortness of breath.   Cardiovascular: Negative for chest pain and palpitations.  Gastrointestinal: Negative for abdominal pain, blood in stool, constipation, diarrhea and nausea.  Endocrine: Negative for polydipsia, polyphagia and polyuria.  Genitourinary: Positive for frequency. Negative for dysuria.  Musculoskeletal: Positive for gait problem. Negative for back pain.  Neurological: Positive for dizziness, light-headedness and numbness.  Hematological: Negative for  adenopathy. Does not bruise/bleed easily.  Psychiatric/Behavioral: Negative for self-injury and suicidal ideas.       Objective:   Physical Exam BP 130/78 (BP Location: Right Arm, Patient Position: Sitting, Cuff Size: Large)   Pulse 75   Temp 98.7 F (37.1 C) (Oral)   Ht 5' (1.524 m)   Wt 193 lb (87.5 kg)   SpO2 96%   BMI 37.69 kg/m Nurses note and vital signs reviewed General-well-nourished, well-developed overweight older female in no acute distress Patient is accompanied by her adult daughter and granddaughter at today's visit EENT-conjunctiva normal and extraocular movements intact.  Head is atraumatic and acephalic.  Head/scalp on the left are nontender to touch.  TMs are gray bilaterally, nares with edema of the nasal turbinates, patient with mild posterior pharynx erythema with slightly large tongue base which causes slight narrowing and poor visualization of the posterior pharynx. Neck-supple, no lymphadenopathy, no thyromegaly, no carotid bruit Cardiovascular-regular rate and rhythm Lungs-clear to auscultation bilaterally Abdomen-truncal obesity, soft and nontender Back-no CVA tenderness, patient does have some mild cervical, thoracic and lumbar paraspinous spasm Extremities-no edema Psych-patient exhibits normal mood and judgment at today's visit and seems cheerful Neuro- cranial nerves II through XII are grossly intact.  Patient does have 5/5 upper and lower extremity strength with a very mild decrease in left lower and upper extremity strength on exam.  Patient however has some difficulty getting on and off of the exam table and has to use her cane and stand still for a minute after getting off of the exam table before walking to make sure that she does not lose her balance       Assessment & Plan:  1. Type 2 diabetes mellitus with hyperglycemia, without long-term current use of insulin (HCC) Patient's blood sugar today's visit was 146 random.  Patient will have BMP in  follow-up of renal function/electrolytes.  Patient given refill of losartan.  Glipizide XL 5 mg before the first meal of the day will be added to help better control blood sugars but patient should call or return if she has any problems with the medication causing her to feel weak, dizzy or have blood sugars that are too low - Glucose (CBG) - Basic Metabolic Panel - losartan (COZAAR) 50 MG tablet; Take 1 tablet (50 mg total) by mouth daily. To lower blood pressure  Dispense: 30 tablet; Refill: 11 - glipiZIDE (GLUCOTROL XL) 5 MG 24 hr tablet; Take 1 tablet (5 mg total) by mouth daily with breakfast. To lower blood sugar  Dispense: 30 tablet; Refill: 6  2. Cerebellar stroke [facility]) Patient status post a cerebellar stroke requiring hospitalization.  Discussed with the patient and her daughter that the stroke occurred in an area that controls motion and balance and this is why patient continues to have difficulty.  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  Patient also with neuropathic type pain status post CVA and prescription provided for Neurontin to take 200 mg at bedtime for 1 week and if patient tolerates this medication well she can then increase to 3 times daily.  Discussed with the daughter that gabapentin can cause dizziness which is why it will initially be given only just before bedtime.  If patient is having significant daytime dizziness with the Neurontin, patient can discontinue and take nighttime dose only. - Ambulatory referral to Social Work - gabapentin (NEURONTIN) 100 MG capsule; One pill twice per day then 2 at bedtime. Start with bedtime dose only for the first week  Dispense: 120 capsule; Refill: 11  3. Neuropathic pain Patient with neuropathic pain for which she  will be started on gabapentin 2 pills at bedtime and if she tolerates this well then in the next 7 days she can increase to taking 100 mg twice daily and 200 mg at bedtime.  Patient and daughter made aware that the medication may cause drowsiness so that they should take the medication just before bedtime and medication can also increase dizziness.  Patient also given a prescription for tramadol to take as needed for more severe pain. - gabapentin (NEURONTIN) 100 MG capsule; One pill twice per day then 2 at bedtime. Start with bedtime dose only for the first week  Dispense: 120 capsule; Refill: 11 - traMADol (ULTRAM) 50 MG tablet; Take 1 tablet (50 mg total) by mouth every 12 (twelve) hours as needed for moderate pain.  Dispense: 60 tablet; Refill: 1  4. Essential hypertension Refill prescriptions provided for labetalol which was started during hospitalization as well as amlodipine.  As blood pressure still slightly above normal will have patient also restart losartan but at 50 mg to help with blood pressure as well as any renal insufficiency/protection of the kidneys as patient is diabetic.  Patient will have BMP today to make sure that her renal function is stable as well as stable electrolytes - labetalol (NORMODYNE) 200 MG tablet; Take 1 tablet (200 mg total) by mouth 2 (two) times daily. To lower blood pressure  Dispense: 60 tablet; Refill: 11 - amLODipine (NORVASC) 10 MG tablet; Take 1 tablet (10 mg total) by mouth daily. To lower blood pressure  Dispense: 30 tablet; Refill: 11 - losartan (COZAAR) 50 MG tablet; Take 1 tablet (50 mg total) by mouth daily. To lower blood pressure  Dispense: 30 tablet; Refill: 11  5.  Hospital follow-up Patient is status post hospitalization for cerebellar stroke in patients hospital records/notes, H&P and discharge summary as well as labs and imaging were reviewed.  Information of importance from her recent hospitalization was discussed with the patient and her  daughter  An After Visit Summary was printed and given to the patient.  Allergies as of 03/03/2018   No Known Allergies     Medication List       Accurate as of March 03, 2018 11:59 PM. Always use your most recent med list.        amLODipine 10 MG tablet Commonly known as:  NORVASC Take 1 tablet (10 mg total) by mouth daily. To lower blood pressure   aspirin 325 MG EC tablet Take 1 tablet (325 mg total) by mouth daily.   blood glucose meter kit and supplies Kit Dispense based on patient and insurance  preference. Use up to four times daily as directed. (FOR ICD-9 250.00, 250.01).   gabapentin 100 MG capsule Commonly known as:  NEURONTIN One pill twice per day then 2 at bedtime. Start with bedtime dose only for the first week   glipiZIDE 5 MG 24 hr tablet Commonly known as:  GLUCOTROL XL Take 1 tablet (5 mg total) by mouth daily with breakfast. To lower blood sugar   glucose blood test strip Commonly known as:  TRUE METRIX BLOOD GLUCOSE TEST Use as instructed   insulin glargine 100 UNIT/ML injection Commonly known as:  LANTUS Inject 0.16 mLs (16 Units total) into the skin at bedtime.   labetalol 200 MG tablet Commonly known as:  NORMODYNE Take 1 tablet (200 mg total) by mouth 2 (two) times daily. To lower blood pressure   losartan 50 MG tablet Commonly known as:  COZAAR Take 1 tablet (50 mg total) by mouth daily. To lower blood pressure   metFORMIN 1000 MG tablet Commonly known as:  GLUCOPHAGE Take 1 tablet (1,000 mg total) by mouth 2 (two) times daily with a meal.   rosuvastatin 20 MG tablet Commonly known as:  CRESTOR Take 1 tablet (20 mg total) by mouth daily.   traMADol 50 MG tablet Commonly known as:  ULTRAM Take 1 tablet (50 mg total) by mouth every 12 (twelve) hours as needed for moderate pain.   TRUE METRIX METER w/Device Kit Use to check blood sugar in the morning.   TRUEPLUS LANCETS 28G Misc Use to check blood sugar daily.       Return in  about 8 weeks (around 04/28/2018) for Diabetes hypertension.

## 2018-03-04 LAB — BASIC METABOLIC PANEL WITH GFR
BUN/Creatinine Ratio: 20 (ref 9–23)
BUN: 9 mg/dL (ref 6–24)
CO2: 19 mmol/L — ABNORMAL LOW (ref 20–29)
Calcium: 9.4 mg/dL (ref 8.7–10.2)
Chloride: 101 mmol/L (ref 96–106)
Creatinine, Ser: 0.44 mg/dL — ABNORMAL LOW (ref 0.57–1.00)
GFR calc Af Amer: 129 mL/min/1.73
GFR calc non Af Amer: 112 mL/min/1.73
Glucose: 136 mg/dL — ABNORMAL HIGH (ref 65–99)
Potassium: 4.2 mmol/L (ref 3.5–5.2)
Sodium: 142 mmol/L (ref 134–144)

## 2018-03-09 ENCOUNTER — Telehealth: Payer: Self-pay

## 2018-03-09 NOTE — Telephone Encounter (Signed)
Home health orders for SN, PT faxed to [facility]

## 2018-03-14 ENCOUNTER — Telehealth: Payer: Self-pay | Admitting: Licensed Clinical Social Worker

## 2018-03-14 NOTE — Telephone Encounter (Signed)
[facility]WA utilized [employer] [phone]) to follow up with patient regarding behavioral health consult. A message was left requesting a return call.

## 2018-03-16 ENCOUNTER — Telehealth: Payer: Self-pay | Admitting: Licensed Clinical Social Worker

## 2018-03-16 ENCOUNTER — Telehealth: Payer: Self-pay | Admitting: *Deleted

## 2018-03-16 NOTE — Telephone Encounter (Signed)
-----   Message from Cain Saupe, MD sent at 03/07/2018 10:50 PM EST ----- Glucose was 136 on recent BMP and BMP was otherwise normal

## 2018-03-16 NOTE — Telephone Encounter (Signed)
Medical Assistant used [employer] to contact patient.  Interpreter Name: Jaclynn Guarneri #: [id number] Patient verified DOB Patient is aware of lab being normal and needing to adhere to DM medications and diet to address elevated sugar levels.

## 2018-03-16 NOTE — Telephone Encounter (Signed)
LCSWA utilized [employer] Judie Grieve [phone]) to follow up with patient regarding behavioral health consult. A message was left requesting a return call.

## 2018-03-23 ENCOUNTER — Encounter: Payer: Self-pay | Admitting: Internal Medicine

## 2018-03-31 ENCOUNTER — Encounter: Payer: Self-pay | Admitting: Adult Health

## 2018-03-31 ENCOUNTER — Ambulatory Visit: Payer: Self-pay | Admitting: Adult Health

## 2018-03-31 VITALS — BP 148/73 | HR 73 | Ht 60.0 in | Wt 191.2 lb

## 2018-03-31 DIAGNOSIS — I639 Cerebral infarction, unspecified: Secondary | ICD-10-CM

## 2018-03-31 DIAGNOSIS — Z794 Long term (current) use of insulin: Secondary | ICD-10-CM

## 2018-03-31 DIAGNOSIS — I10 Essential (primary) hypertension: Secondary | ICD-10-CM

## 2018-03-31 DIAGNOSIS — E1159 Type 2 diabetes mellitus with other circulatory complications: Secondary | ICD-10-CM

## 2018-03-31 DIAGNOSIS — R51 Headache: Secondary | ICD-10-CM

## 2018-03-31 DIAGNOSIS — R2689 Other abnormalities of gait and mobility: Secondary | ICD-10-CM

## 2018-03-31 DIAGNOSIS — E785 Hyperlipidemia, unspecified: Secondary | ICD-10-CM

## 2018-03-31 DIAGNOSIS — R519 Headache, unspecified: Secondary | ICD-10-CM

## 2018-03-31 MED ORDER — TOPIRAMATE 25 MG PO TABS: ORAL_TABLET | ORAL | 0 refills | Status: DC

## 2018-03-31 MED FILL — TOPIRAMATE 25 MG TABS: 25 | 28 days supply | Qty: 49 | Fill #0

## 2018-03-31 NOTE — Patient Instructions (Addendum)
Continue aspirin 325 mg daily  and Crestor 20 mg for secondary stroke prevention  Continue to follow up with PCP regarding blood pressure, diabetes and cholesterol management   Start topamax 25mg  nightly for the first 7 days. After 7 days, start taking twice daily for headache management  If your headaches worsen or develop visual problems or new neurologic symptoms, proceed to ED for further evaluation   Stop use of gabapentin at this time and use tramadol only as needed   We will wait for results of cardiac monitor - if negative, may consider doing additional testing which will be discussed with cardiology next Thursday   Referral placed for additional home therapy due to continued dizziness - if not approved, highly consider doing out patient therapy   Continue to monitor blood pressure at home  Maintain strict control of hypertension with blood pressure goal below 130/90, diabetes with hemoglobin A1c goal below 6.5% and cholesterol with LDL cholesterol (bad cholesterol) goal below 70 mg/dL. I also advised the patient to eat a healthy diet with plenty of whole grains, cereals, fruits and vegetables, exercise regularly and maintain ideal body weight.  Followup in the future with me in 3 months or call earlier if needed       Thank you for [facility] at [facility]. I hope we have been able to provide you high quality care today.  You may receive a patient satisfaction survey over the next few weeks. We would appreciate your feedback and comments so that we may continue to improve ourselves and the health of our patients.     Topiramate tablets What is this medicine? TOPIRAMATE (toe PYRE a mate) is used to treat seizures in adults or children with epilepsy. It is also used for the prevention of migraine headaches. This medicine may be used for other purposes; ask your health care provider or pharmacist if you have questions. COMMON BRAND NAME(S): Topamax,  Topiragen What should I tell my health care provider before I take this medicine? They need to know if you have any of these conditions: -bleeding disorders -cirrhosis of the liver or liver disease -diarrhea -glaucoma -kidney stones or kidney disease -low blood counts, like low white cell, platelet, or red cell counts -lung disease like asthma, obstructive pulmonary disease, emphysema -metabolic acidosis -on a ketogenic diet -schedule for surgery or a procedure -suicidal thoughts, plans, or attempt; a previous suicide attempt by you or a family member -an unusual or allergic reaction to topiramate, other medicines, foods, dyes, or preservatives -pregnant or trying to get pregnant -breast-feeding How should I use this medicine? Take this medicine by mouth with a glass of water. Follow the directions on the prescription label. Do not crush or chew. You may take this medicine with meals. Take your medicine at regular intervals. Do not take it more often than directed. Talk to your pediatrician regarding the use of this medicine in children. Special care may be needed. While this drug may be prescribed for children as young as 542 years of age for selected conditions, precautions do apply. Overdosage: If you think you have taken too much of this medicine contact a poison control center or emergency room at once. NOTE: This medicine is only for you. Do not share this medicine with others. What if I miss a dose? If you miss a dose, take it as soon as you can. If your next dose is to be taken in less than 6 hours, then do not  take the missed dose. Take the next dose at your regular time. Do not take double or extra doses. What may interact with this medicine? Do not take this medicine with any of the following medications: -probenecid This medicine may also interact with the following medications: -acetazolamide -alcohol -amitriptyline -aspirin and aspirin-like medicines -birth control  pills -certain medicines for depression -certain medicines for seizures -certain medicines that treat or prevent blood clots like warfarin, enoxaparin, dalteparin, apixaban, dabigatran, and rivaroxaban -digoxin -hydrochlorothiazide -lithium -medicines for pain, sleep, or muscle relaxation -metformin -methazolamide -NSAIDS, medicines for pain and inflammation, like ibuprofen or naproxen -pioglitazone -risperidone This list may not describe all possible interactions. Give your health care provider a list of all the medicines, herbs, non-prescription drugs, or dietary supplements you use. Also tell them if you smoke, drink alcohol, or use illegal drugs. Some items may interact with your medicine. What should I watch for while using this medicine? Visit your doctor or health care professional for regular checks on your progress. Do not stop taking this medicine suddenly. This increases the risk of seizures if you are using this medicine to control epilepsy. Wear a medical identification bracelet or chain to say you have epilepsy or seizures, and carry a card that lists all your medicines. This medicine can decrease sweating and increase your body temperature. Watch for signs of deceased sweating or fever, especially in children. Avoid extreme heat, hot baths, and saunas. Be careful about exercising, especially in hot weather. Contact your health care provider right away if you notice a fever or decrease in sweating. You should drink plenty of fluids while taking this medicine. If you have had kidney stones in the past, this will help to reduce your chances of forming kidney stones. If you have stomach pain, with nausea or vomiting and yellowing of your eyes or skin, call your doctor immediately. You may get drowsy, dizzy, or have blurred vision. Do not drive, use machinery, or do anything that needs mental alertness until you know how this medicine affects you. To reduce dizziness, do not sit or stand  up quickly, especially if you are an older patient. Alcohol can increase drowsiness and dizziness. Avoid alcoholic drinks. If you notice blurred vision, eye pain, or other eye problems, seek medical attention at once for an eye exam. The use of this medicine may increase the chance of suicidal thoughts or actions. Pay special attention to how you are responding while on this medicine. Any worsening of mood, or thoughts of suicide or dying should be reported to your health care professional right away. This medicine may increase the chance of developing metabolic acidosis. If left untreated, this can cause kidney stones, bone disease, or slowed growth in children. Symptoms include breathing fast, fatigue, loss of appetite, irregular heartbeat, or loss of consciousness. Call your doctor immediately if you experience any of these side effects. Also, tell your doctor about any surgery you plan on having while taking this medicine since this may increase your risk for metabolic acidosis. Birth control pills may not work properly while you are taking this medicine. Talk to your doctor about using an extra method of birth control. Women who become pregnant while using this medicine may enroll in the [place] American Antiepileptic Drug Pregnancy Registry by calling [phone]. This registry collects information about the safety of antiepileptic drug use during pregnancy. What side effects may I notice from receiving this medicine? Side effects that you should report to your doctor or health care professional as  soon as possible: -allergic reactions like skin rash, itching or hives, swelling of the face, lips, or tongue -decreased sweating and/or rise in body temperature -depression -difficulty breathing, fast or irregular breathing patterns -difficulty speaking -difficulty walking or controlling muscle movements -hearing impairment -redness, blistering, peeling or loosening of the skin, including inside the  mouth -tingling, pain or numbness in the hands or feet -unusual bleeding or bruising -unusually weak or tired -worsening of mood, thoughts or actions of suicide or dying Side effects that usually do not require medical attention (report to your doctor or health care professional if they continue or are bothersome): -altered taste -back pain, joint or muscle aches and pains -diarrhea, or constipation -headache -loss of appetite -nausea -stomach upset, indigestion -tremors This list may not describe all possible side effects. Call your doctor for medical advice about side effects. You may report side effects to FDA at 1-800-FDA-1088. Where should I keep my medicine? Keep out of the reach of children. Store at room temperature between 15 and 30 degrees C (59 and 86 degrees F) in a tightly closed container. Protect from moisture. Throw away any unused medicine after the expiration date. NOTE: This sheet is a summary. It may not cover all possible information. If you have questions about this medicine, talk to your doctor, pharmacist, or health care provider.  2019 Elsevier/Gold Standard (2013-01-30 23:17:57)

## 2018-03-31 NOTE — Progress Notes (Signed)
[facility] [address] [place]. [place] [address] [phone]       OFFICE FOLLOW UP NOTE  Ms. Keylee Shrestha Date of Birth:  Jan 09, 1961 Medical Record Number:  [id number]   Reason for Referral:  hospital stroke follow up  CHIEF COMPLAINT:  Chief Complaint  Patient presents with  . 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 History obtained from patient with interpreter and chart review. Reviewed all radiology images and labs personally.  Ms.April Arana-Torresis a 58 y.o.femalewith y.o.femalewith history ofHTN and diabetes who presented with nausea, vomiting, dizziness, unsteady gait and mental status change.  CT head reviewed and showed large area of low density within the right cerebellar hemisphere compatible with infarct.  NIH stroke scale 17.  Unable to receive IV tPA as she was outside treatment window.  CTA head/neck unremarkable.  MRI head reviewed and showed moderate right PICA cerebellar infarct with cytotoxic edema and petechial hemorrhage with mild effacement fourth ventricle.  Multiple serial CT head obtained showing evolving right PICA infarct without Ht and regional mass-effect without hydrocephalus.  Repeat MRI showed right PICA infarct with fourth ventricle effacement without hydrocephalus.  Carotid Doppler showed bilateral ICA 1 to 39% stenosis and vertebral arteries antegrade.  2D echo showed an EF of 55 to 60% without cardiac source of embolus.  TEE negative for cardiac source of embolus.  Infarct embolic pattern secondary to unknown source but suspicion for atrial fibrillation.  Report of palpitations associated with fatigue and therefore recommended 30-day cardiac event  monitor and will hold off on loop until 30-day monitor completed.  Cerebral edema treated with 3% saline which stabilized.  HTN stable and recommended long-term BP goal normotensive range.  LDL 95 and recommended continuation of rosuvastatin 20 mg daily.  A1c 8.9 and recommended tight glycemic control with close PCP follow-up for DM management.  Other stroke risk factors include EtOH use, obesity and hypokalemia without reported stroke history.  Recommended aspirin 325 mg for secondary stroke prevention.  She was discharged home in stable condition with recommended home PT/OT.  Ms. April Fry is being seen today for hospital follow-up.  She has been stable from a stroke standpoint without worsening or new symptoms with residual deficits of balance difficulties and dizziness.  She does have complaints of left orbital pain which has been present since she was in the ED without reports of worsening. She denies history of migraines or headaches. She describes as a pressure sensation on the left side of her face/orbital area which is constant and at times will radiate up into left temporal area or into her right orbital area.  She denies any visual changes or worsening neurological symptoms with these headaches.  She has been using tramadol and gabapentin for pain which has been providing relief which was prescribed by PCP.  She does endorse limiting daytime activity and remains homebound since her stroke due to fear of falling stating that she was told by a different provider if she has 1 fall she will have another stroke and it will be worse.  She continues to ambulate with her cane when she is outside of her house but while she is in her home, she will stabilize herself with furniture and may not  have to use cane.  She initially was receiving home PT/OT but has since been completed.  She has not returned to driving due to ongoing dizziness.  She has completed a 30-day cardiac event monitor on 03/27/2018 (no results  at this time).  She continues on aspirin 325 mg without side effects of bleeding or bruising.  Continues on rosuvastatin 20 mg without side effects myalgias.  Blood pressure today 148/73.  Denies new or worsening stroke/TIA symptoms.    ROS:   14 system review of systems performed and negative with exception of frequent waking, snoring, joint pain, walking difficulty, headache and nervous/anxious  PMH:  Past Medical History:  Diagnosis Date  . Diabetes mellitus without complication ([place])   . Hypertension   . Pneumonia 05/21/2016    PSH:  Past Surgical History:  Procedure Laterality Date  . ABDOMINAL HYSTERECTOMY    . TEE WITHOUT CARDIOVERSION N/A 02/08/2018   Procedure: TRANSESOPHAGEAL ECHOCARDIOGRAM (TEE);  Surgeon: Jerline Pain, MD;  Location: [facility]OSCOPY;  Service: Cardiovascular;  Laterality: N/A;    Social History:  Social History   Socioeconomic History  . Marital status: Single    Spouse name: Not on file  . Number of children: Not on file  . Years of education: Not on file  . Highest education level: Not on file  Occupational History  . Not on file  Social Needs  . Financial resource strain: Not on file  . Food insecurity:    Worry: Not on file    Inability: Not on file  . Transportation needs:    Medical: Not on file    Non-medical: Not on file  Tobacco Use  . Smoking status: Never Smoker  . Smokeless tobacco: Never Used  Substance and Sexual Activity  . Alcohol use: Yes    Comment: on rare occasions.   . Drug use: No  . Sexual activity: Not on file  Lifestyle  . Physical activity:    Days per week: Not on file    Minutes per session: Not on file  . Stress: Not on file  Relationships  . Social connections:    Talks on phone: Not on file    Gets together: Not on file    Attends religious service: Not on file    Active member of club or organization: Not on file    Attends meetings of clubs or organizations: Not on file    Relationship status:  Not on file  . Intimate partner violence:    Fear of current or ex partner: Not on file    Emotionally abused: Not on file    Physically abused: Not on file    Forced sexual activity: Not on file  Other Topics Concern  . Not on file  Social History Narrative  . Not on file    Family History:  Family History  Problem Relation Age of Onset  . Other Mother        parents passed away in an [facility] in 46's  . Heart attack Sister   . Obesity Sister     Medications:   Current Outpatient Medications on File Prior to Visit  Medication Sig Dispense Refill  . amLODipine (NORVASC) 10 MG tablet Take 1 tablet (10 mg total) by mouth daily. To lower blood pressure 30 tablet 11  . aspirin EC 325 MG EC tablet Take 1 tablet (325 mg total) by mouth daily. 30 tablet 0  . blood glucose meter kit and supplies KIT Dispense based  on patient and insurance preference. Use up to four times daily as directed. (FOR ICD-9 250.00, 250.01). 1 each 0  . Blood Glucose Monitoring Suppl (TRUE METRIX METER) w/Device KIT Use to check blood sugar in the morning. 1 kit 0  . gabapentin (NEURONTIN) 100 MG capsule One pill twice per day then 2 at bedtime. Start with bedtime dose only for the first week 120 capsule 11  . glipiZIDE (GLUCOTROL XL) 5 MG 24 hr tablet Take 1 tablet (5 mg total) by mouth daily with breakfast. To lower blood sugar 30 tablet 6  . glucose blood (TRUE METRIX BLOOD GLUCOSE TEST) test strip Use as instructed 100 each 12  . insulin glargine (LANTUS) 100 UNIT/ML injection Inject 0.16 mLs (16 Units total) into the skin at bedtime. 10 mL 2  . labetalol (NORMODYNE) 200 MG tablet Take 1 tablet (200 mg total) by mouth 2 (two) times daily. To lower blood pressure 60 tablet 11  . losartan (COZAAR) 50 MG tablet Take 1 tablet (50 mg total) by mouth daily. To lower blood pressure 30 tablet 11  . metFORMIN (GLUCOPHAGE) 1000 MG tablet Take 1 tablet (1,000 mg total) by mouth 2 (two) times daily with a meal. 180  tablet 3  . rosuvastatin (CRESTOR) 20 MG tablet Take 1 tablet (20 mg total) by mouth daily. 30 tablet 2  . traMADol (ULTRAM) 50 MG tablet Take 1 tablet (50 mg total) by mouth every 12 (twelve) hours as needed for moderate pain. 60 tablet 1  . TRUEPLUS LANCETS 28G MISC Use to check blood sugar daily. 100 each 11   Current Facility-Administered Medications on File Prior to Visit  Medication Dose Route Frequency Provider Last Rate Last Dose  . pneumococcal 13-valent conjugate vaccine (PREVNAR 13) injection 0.5 mL  0.5 mL Intramuscular Once Fredia Beets R, FNP        Allergies:  No Known Allergies   Physical Exam  Vitals:   03/31/18 0752  Height: 5' (1.524 m)   Body mass index is 37.69 kg/m. No exam data present  Depression screen [facility] 2/9 03/03/2018  Decreased Interest 0  Down, Depressed, Hopeless 3  PHQ - 2 Score 3  Altered sleeping 3  Tired, decreased energy 3  Change in appetite 3  Feeling bad or failure about yourself  3  Trouble concentrating 1  Moving slowly or fidgety/restless 0  Suicidal thoughts 0  PHQ-9 Score 16     General: Obese pleasant middle-aged Hispanic Spanish-speaking female, seated, in no evident distress Head: head normocephalic and atraumatic.  Denies pain with pressure around bilateral orbital area Neck: supple with no carotid or supraclavicular bruits Cardiovascular: regular rate and rhythm, no murmurs Musculoskeletal: no deformity Skin:  no rash/petichiae Vascular:  Normal pulses all extremities  Neurologic Exam Mental Status: Awake and fully alert. Oriented to place and time. Recent and remote memory intact. Attention span, concentration and fund of knowledge appropriate. Mood and affect appropriate.  Cranial Nerves: Fundoscopic exam reveals sharp disc margins. Pupils equal, briskly reactive to light. Extraocular movements full without nystagmus. Visual fields full to confrontation. Hearing intact. Facial sensation intact. Face, tongue, palate  moves normally and symmetrically.  Motor: Normal bulk and tone. Normal strength in all tested extremity muscles. Sensory.: intact to touch , pinprick , position and vibratory sensation.  Coordination: Rapid alternating movements normal in all extremities. Finger-to-nose and heel-to-shin performed accurately bilaterally. Gait and Station: Arises from chair without difficulty. Stance is normal. Gait demonstrates normal stride length and balance with assistance  of cane. Able to heel, toe and tandem walk without difficulty.  Romberg negative. Reflexes: 1+ and symmetric. Toes downgoing.    NIHSS  0 Modified Rankin  2    Diagnostic Data (Labs, Imaging, Testing)  CT HEAD WO CONTRAST 01/31/2018 IMPRESSION: 1. Large area of low density within the right cerebral hemisphere is identified compatible with infarct. This is likely subacute in nature and involves much of the entire right PICA territory. Further investigation with CTA of the head and neck and brain MRI is advised.  CT ANGIO HEAD W OR WO CONTRAST CT ANGIO NECK W OR WO CONTRAST 01/31/2018 IMPRESSION: In this patient with inferior cerebellar infarction on the right, there is no evidence of vertebral stenosis, occlusion or dissection. Flow is present in both posterior inferior cerebellar arteries presently. No basilar artery disease or other posterior circulation branch vessel abnormality.  MR BRAIN WO CONTRAST 01/31/2018 IMPRESSION: 1. Moderate size acute Right PICA cerebellar infarct with cytotoxic edema and petechial hemorrhage. Mild posterior fossa mass effect including partial effacement of the 4th ventricle, but no ventriculomegaly or malignant hemorrhagic transformation. 2. No brainstem infarct or other acute intracranial abnormality. 3. Mild to moderate for age nonspecific cerebral white matter signal Changes.  MR BRAIN WO CONTRAST 02/03/2018 (repeat) IMPRESSION: 1. Evolving acute RIGHT cerebellar PICA territory  infarct with petechial hemorrhage. Partially effaced fourth ventricle without obstructive hydrocephalus. 2. No LEFT brachium pontis abnormality.  Carotid Doppler  There is 1-39% bilateral ICA stenosis. Vertebral artery flow is antegrade.   2D echocardiogram - Left ventricle: The cavity size was normal. There was mildconcentric hypertrophy. Systolic function was normal. Theestimated ejection fraction was in the range of 55% to 60%.Wallmotion was normal; there were no regional wall motionabnormalities. Doppler parameters are consistent with abnormalleft ventricular relaxation (grade 1 diastolic dysfunction).Doppler parameters are consistent with indeterminate ventricularfilling pressure. - Aorta: Ascending aortic diameter: 38 mm (S). - Ascending aorta: The ascending aorta was mildly dilated. - Mitral valve: Transvalvular velocity was within the normal range.There was no evidence for stenosis. There was trivial regurgitation. - Right ventricle: The cavity size was normal. Wall thickness wasnormal. Systolic function was normal. - Tricuspid valve: There was trivial regurgitation. - Pulmonary arteries: Systolic pressure was within the normalrange. PA peak pressure: 32 mm Hg (S).  TEE Left Ventricle:Normal EF Mitral Valve:Trace MR Aortic Valve:Normal Tricuspid Valve:Trace TR Left Atrium:Normal, no thrombus Bubble Contrast Study:negative. No PFO No embolic source identified.    ASSESSMENT: April Fry is a 58 y.o. 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 Vascular risk factors include HTN, HLD and DM.  She is being seen today for hospital follow-up with residual deficits of balance difficulties and dizziness.    PLAN:  1. Right PICA infarct: Continue aspirin 325 mg daily  and rosuvastatin 20 mg  for secondary stroke prevention. Maintain strict control of hypertension with blood pressure goal below 130/90, diabetes with hemoglobin A1c goal below 6.5% and cholesterol with LDL cholesterol (bad cholesterol) goal below 70 mg/dL.  I also advised the patient to eat a healthy diet with plenty of whole grains, cereals, fruits and vegetables, exercise regularly with at least 30 minutes of continuous activity daily and maintain ideal body weight. 2. Post stroke balance/dizziness: Referral placed for home PT for additional improvement of ongoing difficulties along with assistance with increasing confidence in her walking abilities.  Long discussion with patient regarding her extreme  fear of falling limiting her daily activities and routine.  Highly encouraged increasing activity during the day with preventing falls but did reassure her that if she does experience fall, this will not necessarily ultimately lead to a new worsening stroke.  Fall prevention important due to possibility of potential hemorrhage or injury but her fear of falling cannot limit her activity as her residual deficits will likely worsen and become deconditioned.  Upon assessment, she does have residual mild balance difficulties but otherwise stable with normal walk, turning and tandem walk.  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 Left orbital headache: Likely residual of stroke.  Initiated Topamax 25 mg nightly for 7 days and then Topamax 25 mg twice daily thereafter.  Advised her that side effect could be increased dizziness and fatigue but this is why we will start at a low dose and gradually increase.  Recommended discontinuation of gabapentin as she does not complain of neuropathic pain along with potential side effect of worsening dizziness especially with initiating Topamax 4. Post stroke depression: PHQ 9  performed by PCP recently.  Behavioral health referral placed by PCP 5. HTN: Advised to continue current treatment regimen.  Today's BP 148/73.  Advised to continue to monitor at home along with continued follow-up with PCP for management 6. HLD: Advised to continue current treatment regimen along with continued follow-up with PCP for future prescribing and monitoring of lipid panel 7. DMII: Advised to continue to monitor glucose levels at home along with continued follow-up with PCP for management and monitoring 8. Completed 30-day cardiac event monitor last week therefore results not available at this time but she does have scheduled appointment with cardiology next week for follow-up.  Consider loop recorder placement if cardiac monitor negative to assess for atrial fibrillation    Follow up in 3 months or call earlier if needed   Greater than 50% of time during this 25 minute visit was spent on counseling, explanation of diagnosis of right PICA infarct, reviewing risk factor management of HTN, HLD and DM, long discussion with ongoing deficits and headache, post stroke depression with fear of falling, planning of further management along with potential future management, and discussion with patient and family answering all questions.    Venancio Poisson, AGNP-BC  [facility] [address] [place], [place] [address]  Phone [phone] Fax [phone] Note: This document was prepared with digital dictation and possible smart phrase technology. Any transcriptional errors that result from this process are unintentional.

## 2018-04-01 NOTE — Progress Notes (Signed)
I agree with the above plan 

## 2018-04-06 ENCOUNTER — Telehealth: Payer: Self-pay | Admitting: Adult Health

## 2018-04-06 NOTE — Telephone Encounter (Signed)
Patient sent via epic community messenger to Rhunette Croft with [facility] health. DW

## 2018-04-07 ENCOUNTER — Encounter: Payer: Self-pay | Admitting: Internal Medicine

## 2018-04-07 ENCOUNTER — Telehealth: Payer: Self-pay | Admitting: *Deleted

## 2018-04-07 ENCOUNTER — Ambulatory Visit (INDEPENDENT_AMBULATORY_CARE_PROVIDER_SITE_OTHER): Payer: Self-pay | Admitting: Internal Medicine

## 2018-04-07 VITALS — BP 126/80 | HR 78 | Ht 60.0 in | Wt 190.8 lb

## 2018-04-07 DIAGNOSIS — I639 Cerebral infarction, unspecified: Secondary | ICD-10-CM

## 2018-04-07 MED FILL — metFORMIN HCL 1000 MG TABS: 1000 | 30 days supply | Qty: 60 | Fill #2

## 2018-04-07 MED FILL — AMLODIPINE BESYLATE 10 MG T: 10 | 30 days supply | Qty: 30 | Fill #0

## 2018-04-07 MED FILL — ROSUVASTATIN CALCIUM 20 MG: 20 | 30 days supply | Qty: 30 | Fill #2

## 2018-04-07 MED FILL — TRUE METRIX TEST STRIP: 30 days supply | Qty: 100 | Fill #1

## 2018-04-07 MED FILL — LOSARTAN POTASSIUM 50 MG TA: 50 | 30 days supply | Qty: 30 | Fill #1

## 2018-04-07 MED FILL — LABETALOL HCL 200 MG TABLET: 200 | 30 days supply | Qty: 60 | Fill #0

## 2018-04-07 MED FILL — TRUEplus LANCETS 28G MISC: 30 days supply | Qty: 100 | Fill #1

## 2018-04-07 MED FILL — glipiZIDE XL 5 MG TB24: 5 | 30 days supply | Qty: 30 | Fill #1

## 2018-04-07 NOTE — Patient Instructions (Addendum)
Medication Instructions:  Your physician recommends that you continue on your current medications as directed. Please refer to the Current Medication list given to you today.  Labwork: None ordered.  Testing/Procedures: None ordered.  Follow-Up: You will follow up with device clinic 7-10 days after loop placement for a wound check.   INSTRUCTIONS FOR LOOP PLACEMENT:  You will go to [facility] main entrance and head to ADMITTING at 6:30 am on April 11, 2018  There are no special instructions   Implantable Loop Recorder Placement  An implantable loop recorder is a small electronic device that is placed under the skin of your chest. It is about the size of an AA ("double A") battery. The device records the electrical activity of your heart over a long period of time. Your health care provider can download these recordings to monitor your heart. You may need an implantable loop recorder if you have periods of abnormal heart activity (arrhythmias) or unexplained fainting (syncope). The recorder can be left in place for 1 year or longer. Tell a health care provider about:  Any allergies you have.  All medicines you are taking, including vitamins, herbs, eye drops, creams, and over-the-counter medicines.  Any problems you or family members have had with anesthetic medicines.  Any blood disorders you have.  Any surgeries you have had.  Any medical conditions you have.  Whether you are pregnant or may be pregnant. What are the risks? Generally, this is a safe procedure. However, problems may occur, including:  Infection.  Bleeding.  Allergic reactions to anesthetic medicines.  Damage to nerves or blood vessels.  Failure of the device to work. This could require another surgery to replace it. What happens before the procedure?   You may have a physical exam, blood tests, and imaging tests of your heart, such as a chest X-ray.  Follow instructions from your health care  provider about eating or drinking restrictions.  Ask your health care provider about: ? Changing or stopping your regular medicines. This is especially important if you are taking diabetes medicines or blood thinners. ? Taking medicines such as aspirin and ibuprofen. These medicines can thin your blood. Do not take these medicines unless your health care provider tells you to take them. ? Taking over-the-counter medicines, vitamins, herbs, and supplements.  Ask your health care provider how your surgical site will be marked or identified.  Ask your health care provider what steps will be taken to help prevent infection. These may include: ? Removing hair at the surgery site. ? Washing skin with a germ-killing soap.  Plan to have someone take you home from the hospital or clinic.  Plan to have a responsible adult care for you for at least 24 hours after you leave the hospital or clinic. This is important.  Do not use any products that contain nicotine or tobacco, such as cigarettes and e-cigarettes. If you need help quitting, ask your health care provider. What happens during the procedure?  An IV will be inserted into one of your veins.  You may be given one or more of the following: ? A medicine to help you relax (sedative). ? A medicine to numb the area (local anesthetic).  A small incision will be made on the left side of your upper chest.  A pocket will be created under your skin.  The device will be placed in the pocket.  The incision will be closed with stitches (sutures) or adhesive strips.  A bandage (dressing) will  be placed over the incision. The procedure may vary among health care providers and hospitals. What happens after the procedure?  Your blood pressure, heart rate, breathing rate, and blood oxygen level will be monitored until you leave the hospital or clinic.  You may be able to go home on the day of your surgery. Before you go home: ? Your health care  provider will program your recorder. ? You will learn how to trigger your device with a handheld activator. ? You will learn how to send recordings to your health care provider. ? You will get an ID card for your device, and you will be told when to use it.  Do not drive for 24 hours if you were given a sedative during your procedure. Summary  An implantable loop recorder is a small electronic device that is placed under the skin of your chest to monitor your heart over a long period of time.  The recorder can be left in place for 1 year or longer.  Plan to have someone take you home from the hospital or clinic. This information is not intended to replace advice given to you by your health care provider. Make sure you discuss any questions you have with your health care provider. Document Released: 01/07/2015 Document Revised: 03/13/2017 Document Reviewed: 03/13/2017 Elsevier Interactive Patient Education  2019 [employer].

## 2018-04-07 NOTE — H&P (View-Only) (Signed)
HPI Mrs. 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 She wore a cardiac monitor for 4 weeks but had no atrial fib. She has DM, HTN, and obesity. She also has LBBB. 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  No Known Allergies   Current Outpatient Medications  Medication Sig Dispense Refill  . amLODipine (NORVASC) 10 MG tablet Take 1 tablet (10 mg total) by mouth daily. To lower blood pressure 30 tablet 11  . aspirin EC 325 MG EC tablet Take 1 tablet (325 mg total) by mouth daily. 30 tablet 0  . blood glucose meter kit and supplies KIT Dispense based on patient and insurance preference. Use up to four times daily as directed. (FOR ICD-9 250.00, 250.01). 1 each 0  . Blood Glucose Monitoring Suppl (TRUE METRIX METER) w/Device KIT Use to check blood sugar in the morning. 1 kit 0  . glipiZIDE (GLUCOTROL XL) 5 MG 24 hr tablet Take 1 tablet (5 mg total) by mouth daily with breakfast. To lower blood sugar 30 tablet 6  . glucose blood (TRUE METRIX BLOOD GLUCOSE TEST) test strip Use as instructed 100 each 12  . insulin glargine (LANTUS) 100 UNIT/ML injection Inject 0.16 mLs (16 Units total) into the skin at bedtime. 10 mL 2  . labetalol (NORMODYNE) 200 MG tablet Take 1 tablet (200 mg total) by mouth 2 (two) times daily. To lower blood pressure 60 tablet 11  . losartan (COZAAR) 50 MG tablet Take 1 tablet (50 mg total) by mouth daily. To lower blood pressure 30 tablet 11  . metFORMIN (GLUCOPHAGE) 1000 MG tablet Take 1 tablet (1,000 mg total) by mouth 2 (two) times daily with a meal. 180 tablet 3  . rosuvastatin (CRESTOR) 20 MG tablet Take 1 tablet (20 mg total) by mouth daily. 30 tablet 2  . topiramate (TOPAMAX) 25 MG tablet Take 1 tablet (25 mg total) by mouth daily for 7 days, THEN 1 tablet (25 mg total) 2 (two) times daily for 21 days. 49 tablet 0  . traMADol (ULTRAM) 50 MG tablet Take 1 tablet (50 mg total) by mouth every 12 (twelve) hours as needed for moderate  pain. 60 tablet 1  . TRUEPLUS LANCETS 28G MISC Use to check blood sugar daily. 100 each 11   Current Facility-Administered Medications  Medication Dose Route Frequency Provider Last Rate Last Dose  . pneumococcal 13-valent conjugate vaccine (PREVNAR 13) injection 0.5 mL  0.5 mL Intramuscular Once Alfonse Spruce, FNP         Past Medical History:  Diagnosis Date  . Diabetes mellitus without complication ([place])   . Hypertension   . Pneumonia 05/21/2016    ROS:   All systems reviewed and negative except as noted in the HPI.   Past Surgical History:  Procedure Laterality Date  . ABDOMINAL HYSTERECTOMY    . TEE WITHOUT CARDIOVERSION N/A 02/08/2018   Procedure: TRANSESOPHAGEAL ECHOCARDIOGRAM (TEE);  Surgeon: Jerline Pain, MD;  Location: [facility] ENDOSCOPY;  Service: Cardiovascular;  Laterality: N/A;     Family History  Problem Relation Age of Onset  . Other Mother        parents passed away in an [place] in 62's  . Heart attack Sister   . Obesity Sister      Social History   Socioeconomic History  . Marital status: Single    Spouse name: Not on file  . Number of children: Not on file  . Years of education:  Not on file  . Highest education level: Not on file  Occupational History  . Not on file  Social Needs  . Financial resource strain: Not on file  . Food insecurity:    Worry: Not on file    Inability: Not on file  . Transportation needs:    Medical: Not on file    Non-medical: Not on file  Tobacco Use  . Smoking status: Never Smoker  . Smokeless tobacco: Never Used  Substance and Sexual Activity  . Alcohol use: Yes    Comment: on rare occasions.   . Drug use: No  . Sexual activity: Not on file  Lifestyle  . Physical activity:    Days per week: Not on file    Minutes per session: Not on file  . Stress: Not on file  Relationships  . Social connections:    Talks on phone: Not on file    Gets together: Not on file    Attends religious service:  Not on file    Active member of club or organization: Not on file    Attends meetings of clubs or organizations: Not on file    Relationship status: Not on file  . Intimate partner violence:    Fear of current or ex partner: Not on file    Emotionally abused: Not on file    Physically abused: Not on file    Forced sexual activity: Not on file  Other Topics Concern  . Not on file  Social History Narrative  . Not on file     BP 126/80   Pulse 78   Ht 5' (1.524 m)   Wt 190 lb 12.8 oz (86.5 kg)   SpO2 97%   BMI 37.26 kg/m   Physical Exam:  Well appearing NAD HEENT: Unremarkable Neck:  No JVD, no thyromegally Lymphatics:  No adenopathy Back:  No CVA tenderness Lungs:  Clear HEART:  Regular rate rhythm, no murmurs, no rubs, no clicks Abd:  soft, positive bowel sounds, no organomegally, no rebound, no guarding Ext:  2 plus pulses, no edema, no cyanosis, no clubbing Skin:  No rashes no nodules Neuro:  CN II through XII intact, motor grossly intact  EKG - nsr with lbbb   Assess/Plan: 1. Cryptogenic stroke - she will undergo insertion of an ILR. I have discussed the indications for treatment and she wishes to proceed. 2. HTN - her bp is well controlled.  Gregg Taylor,M.D. 

## 2018-04-07 NOTE — Telephone Encounter (Signed)
-----   Message from Marvel Plan, MD sent at 04/07/2018 12:34 AM EST ----- Could you please let the patient know that the cardiac monitoring done recently was negative for irregular heart beats. Please continue current treatment. Thanks.  Marvel Plan, MD PhD Stroke Neurology 04/07/2018 12:34 AM

## 2018-04-07 NOTE — Telephone Encounter (Signed)
I called [place] interpreter line at [phone] and spoke with Casia ID #: [phone] (spanish interpreter). She LVM (ok per DPR) about results per Dr. Roda Shutters note. She provided [facility] phone number if any further questions.

## 2018-04-07 NOTE — Progress Notes (Signed)
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 She wore a cardiac monitor for 4 weeks but had no atrial fib. She has DM, HTN, and obesity. She also has LBBB. 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  No Known Allergies   Current Outpatient Medications  Medication Sig Dispense Refill  . amLODipine (NORVASC) 10 MG tablet Take 1 tablet (10 mg total) by mouth daily. To lower blood pressure 30 tablet 11  . aspirin EC 325 MG EC tablet Take 1 tablet (325 mg total) by mouth daily. 30 tablet 0  . blood glucose meter kit and supplies KIT Dispense based on patient and insurance preference. Use up to four times daily as directed. (FOR ICD-9 250.00, 250.01). 1 each 0  . Blood Glucose Monitoring Suppl (TRUE METRIX METER) w/Device KIT Use to check blood sugar in the morning. 1 kit 0  . glipiZIDE (GLUCOTROL XL) 5 MG 24 hr tablet Take 1 tablet (5 mg total) by mouth daily with breakfast. To lower blood sugar 30 tablet 6  . glucose blood (TRUE METRIX BLOOD GLUCOSE TEST) test strip Use as instructed 100 each 12  . insulin glargine (LANTUS) 100 UNIT/ML injection Inject 0.16 mLs (16 Units total) into the skin at bedtime. 10 mL 2  . labetalol (NORMODYNE) 200 MG tablet Take 1 tablet (200 mg total) by mouth 2 (two) times daily. To lower blood pressure 60 tablet 11  . losartan (COZAAR) 50 MG tablet Take 1 tablet (50 mg total) by mouth daily. To lower blood pressure 30 tablet 11  . metFORMIN (GLUCOPHAGE) 1000 MG tablet Take 1 tablet (1,000 mg total) by mouth 2 (two) times daily with a meal. 180 tablet 3  . rosuvastatin (CRESTOR) 20 MG tablet Take 1 tablet (20 mg total) by mouth daily. 30 tablet 2  . topiramate (TOPAMAX) 25 MG tablet Take 1 tablet (25 mg total) by mouth daily for 7 days, THEN 1 tablet (25 mg total) 2 (two) times daily for 21 days. 49 tablet 0  . traMADol (ULTRAM) 50 MG tablet Take 1 tablet (50 mg total) by mouth every 12 (twelve) hours as needed for moderate  pain. 60 tablet 1  . TRUEPLUS LANCETS 28G MISC Use to check blood sugar daily. 100 each 11   Current Facility-Administered Medications  Medication Dose Route Frequency Provider Last Rate Last Dose  . pneumococcal 13-valent conjugate vaccine (PREVNAR 13) injection 0.5 mL  0.5 mL Intramuscular Once Alfonse Spruce, FNP         Past Medical History:  Diagnosis Date  . Diabetes mellitus without complication ([place])   . Hypertension   . Pneumonia 05/21/2016    ROS:   All systems reviewed and negative except as noted in the HPI.   Past Surgical History:  Procedure Laterality Date  . ABDOMINAL HYSTERECTOMY    . TEE WITHOUT CARDIOVERSION N/A 02/08/2018   Procedure: TRANSESOPHAGEAL ECHOCARDIOGRAM (TEE);  Surgeon: Jerline Pain, MD;  Location: [facility] ENDOSCOPY;  Service: Cardiovascular;  Laterality: N/A;     Family History  Problem Relation Age of Onset  . Other Mother        parents passed away in an [place] in 80's  . Heart attack Sister   . Obesity Sister      Social History   Socioeconomic History  . Marital status: Single    Spouse name: Not on file  . Number of children: Not on file  . Years of education:  Not on file  . Highest education level: Not on file  Occupational History  . Not on file  Social Needs  . Financial resource strain: Not on file  . Food insecurity:    Worry: Not on file    Inability: Not on file  . Transportation needs:    Medical: Not on file    Non-medical: Not on file  Tobacco Use  . Smoking status: Never Smoker  . Smokeless tobacco: Never Used  Substance and Sexual Activity  . Alcohol use: Yes    Comment: on rare occasions.   . Drug use: No  . Sexual activity: Not on file  Lifestyle  . Physical activity:    Days per week: Not on file    Minutes per session: Not on file  . Stress: Not on file  Relationships  . Social connections:    Talks on phone: Not on file    Gets together: Not on file    Attends religious service:  Not on file    Active member of club or organization: Not on file    Attends meetings of clubs or organizations: Not on file    Relationship status: Not on file  . Intimate partner violence:    Fear of current or ex partner: Not on file    Emotionally abused: Not on file    Physically abused: Not on file    Forced sexual activity: Not on file  Other Topics Concern  . Not on file  Social History Narrative  . Not on file     BP 126/80   Pulse 78   Ht 5' (1.524 m)   Wt 190 lb 12.8 oz (86.5 kg)   SpO2 97%   BMI 37.26 kg/m   Physical Exam:  Well appearing NAD HEENT: Unremarkable Neck:  No JVD, no thyromegally Lymphatics:  No adenopathy Back:  No CVA tenderness Lungs:  Clear HEART:  Regular rate rhythm, no murmurs, no rubs, no clicks Abd:  soft, positive bowel sounds, no organomegally, no rebound, no guarding Ext:  2 plus pulses, no edema, no cyanosis, no clubbing Skin:  No rashes no nodules Neuro:  CN II through XII intact, motor grossly intact  EKG - nsr with lbbb   Assess/Plan: 1. Cryptogenic stroke - she will undergo insertion of an ILR. I have discussed the indications for treatment and she wishes to proceed. 2. HTN - her bp is well controlled.  Gregg Taylor,M.D. 

## 2018-04-11 ENCOUNTER — Encounter (HOSPITAL_COMMUNITY): Payer: Self-pay | Admitting: Internal Medicine

## 2018-04-11 ENCOUNTER — Ambulatory Visit (HOSPITAL_COMMUNITY)
Admission: RE | Admit: 2018-04-11 | Discharge: 2018-04-11 | Disposition: A | Discharge status: Home / Self Care | Payer: Self-pay | Attending: Internal Medicine | Admitting: Internal Medicine

## 2018-04-11 ENCOUNTER — Encounter (HOSPITAL_COMMUNITY)
Admission: RE | Disposition: A | Discharge status: Home / Self Care | Payer: Self-pay | Source: Home / Self Care | Attending: Internal Medicine

## 2018-04-11 DIAGNOSIS — I6389 Other cerebral infarction: Secondary | ICD-10-CM

## 2018-04-11 DIAGNOSIS — I639 Cerebral infarction, unspecified: Secondary | ICD-10-CM | POA: Diagnosis not present

## 2018-04-11 DIAGNOSIS — Z794 Long term (current) use of insulin: Secondary | ICD-10-CM | POA: Insufficient documentation

## 2018-04-11 DIAGNOSIS — I447 Left bundle-branch block, unspecified: Secondary | ICD-10-CM | POA: Insufficient documentation

## 2018-04-11 DIAGNOSIS — I10 Essential (primary) hypertension: Secondary | ICD-10-CM | POA: Insufficient documentation

## 2018-04-11 DIAGNOSIS — Z6837 Body mass index (BMI) 37.0-37.9, adult: Secondary | ICD-10-CM | POA: Insufficient documentation

## 2018-04-11 DIAGNOSIS — Z79899 Other long term (current) drug therapy: Secondary | ICD-10-CM | POA: Insufficient documentation

## 2018-04-11 DIAGNOSIS — E119 Type 2 diabetes mellitus without complications: Secondary | ICD-10-CM | POA: Insufficient documentation

## 2018-04-11 DIAGNOSIS — Z7982 Long term (current) use of aspirin: Secondary | ICD-10-CM | POA: Insufficient documentation

## 2018-04-11 HISTORY — PX: LOOP RECORDER INSERTION: EP1214

## 2018-04-11 LAB — GLUCOSE, CAPILLARY: Glucose-Capillary: 185 mg/dL — ABNORMAL HIGH (ref 70–99)

## 2018-04-11 SURGERY — LOOP RECORDER INSERTION

## 2018-04-11 MED ORDER — LIDOCAINE-EPINEPHRINE 1 %-1:100000 IJ SOLN
INTRAMUSCULAR | Status: AC
  Filled 2018-04-11: qty 1

## 2018-04-11 MED ORDER — LIDOCAINE-EPINEPHRINE 1 %-1:100000 IJ SOLN
INTRAMUSCULAR | Status: DC | PRN
  Administered 2018-04-11: 15 mL

## 2018-04-11 SURGICAL SUPPLY — 2 items
LOOP REVEAL LINQSYS (Prosthesis & Implant Heart) ×3 IMPLANT
PACK LOOP INSERTION (CUSTOM PROCEDURE TRAY) ×3 IMPLANT

## 2018-04-11 NOTE — Discharge Instructions (Signed)
LOOP RECORDER INSTRUCTION SHEET GIVEN. °

## 2018-04-11 NOTE — Interval H&P Note (Signed)
History and Physical Interval Note:  04/11/2018 7:14 AM  April Fry  has presented today for surgery, with the diagnosis of stroke  The various methods of treatment have been discussed with the patient and family. After consideration of risks, benefits and other options for treatment, the patient has consented to  Procedure(s): LOOP RECORDER INSERTION (N/A) as a surgical intervention .  The patient's history has been reviewed, patient examined, no change in status, stable for surgery.  I have reviewed the patient's chart and labs.  Questions were answered to the patient's satisfaction.     Lewayne Bunting

## 2018-04-15 ENCOUNTER — Telehealth: Payer: Self-pay | Admitting: Neurology

## 2018-04-15 NOTE — Telephone Encounter (Signed)
Patient's daughter, Ambor Hrncir, called, stating, that patient has been c/o of shooting pains in the head, itching of the scalp, lack of motivation, not feeling like getting out of bed, ever since she started the new med, ie topamax. I reviewed the chart: topiramate was started on 03/31/18 and gabapentin stopped, when pt saw JV. Pt is currently taking 25 mg bid. Daughter is not currently with the patient, pt lives with BF, but he is also at work. I advised daughter to talk to pt and have her stop the medication and monitor Sx. If she develops any sudden neurological severe symptoms, such as severe HA, weakness, she would have to go to ER. Daughter is at work and will call her now to check on pt and to relay the advice. Patient does not speak English very well and made sure, daughter demonstrated understanding and agreement. She was encouraged to call the office next week if needed.

## 2018-04-18 ENCOUNTER — Ambulatory Visit (INDEPENDENT_AMBULATORY_CARE_PROVIDER_SITE_OTHER): Payer: Self-pay | Admitting: *Deleted

## 2018-04-18 DIAGNOSIS — I639 Cerebral infarction, unspecified: Secondary | ICD-10-CM

## 2018-04-18 LAB — CUP PACEART INCLINIC DEVICE CHECK
Date Time Interrogation Session: 20200309125008
Implantable Pulse Generator Implant Date: 20200302

## 2018-04-18 NOTE — Progress Notes (Signed)
Device Clinic loop recorder wound check. Steri-strips removed. Wound without redness or edema. Incision edges approximated, wound well healed. Normal device function. Battery status: good. No symptom, tachy, or AF episodes. Pause and brady detection off at implant. Patient educated about wound care and Carelink monitor via interpreter. Monthly summary reports and ROV with GT PRN.

## 2018-04-20 NOTE — Telephone Encounter (Signed)
Noted! Thank you

## 2018-04-21 ENCOUNTER — Ambulatory Visit: Payer: Self-pay | Admitting: Family Medicine

## 2018-04-21 ENCOUNTER — Ambulatory Visit: Payer: Self-pay

## 2018-04-28 ENCOUNTER — Ambulatory Visit: Payer: Self-pay | Admitting: Family Medicine

## 2018-05-16 ENCOUNTER — Ambulatory Visit (INDEPENDENT_AMBULATORY_CARE_PROVIDER_SITE_OTHER): Payer: Self-pay | Admitting: *Deleted

## 2018-05-16 ENCOUNTER — Other Ambulatory Visit: Payer: Self-pay

## 2018-05-16 ENCOUNTER — Other Ambulatory Visit: Payer: Self-pay | Admitting: Pharmacist

## 2018-05-16 DIAGNOSIS — I639 Cerebral infarction, unspecified: Secondary | ICD-10-CM

## 2018-05-16 LAB — CUP PACEART REMOTE DEVICE CHECK
Date Time Interrogation Session: 20200404120736
Implantable Pulse Generator Implant Date: 20200302

## 2018-05-16 MED ORDER — "INSULIN SYRINGE-NEEDLE U-100 31G X 5/16"" 0.3 ML MISC": 11 refills | Status: DC

## 2018-05-16 MED FILL — TRUEPLUS SYR 0.5ML 31GX5/16: 31G X 5/16" | 25 days supply | Qty: 100 | Fill #0

## 2018-05-17 MED FILL — metFORMIN HCL 1000 MG TABS: 1000 | 30 days supply | Qty: 60 | Fill #3

## 2018-05-17 MED FILL — AMLODIPINE BESYLATE 10 MG T: 10 | 30 days supply | Qty: 30 | Fill #1

## 2018-05-17 MED FILL — LABETALOL HCL 200 MG TABLET: 200 | 30 days supply | Qty: 60 | Fill #1

## 2018-05-23 NOTE — Progress Notes (Signed)
Carelink Summary Report / Loop Recorder 

## 2018-05-30 ENCOUNTER — Other Ambulatory Visit: Payer: Self-pay

## 2018-05-30 ENCOUNTER — Ambulatory Visit: Payer: Self-pay | Attending: Family Medicine | Admitting: Family Medicine

## 2018-05-30 ENCOUNTER — Encounter: Payer: Self-pay | Admitting: Family Medicine

## 2018-05-30 DIAGNOSIS — E1165 Type 2 diabetes mellitus with hyperglycemia: Secondary | ICD-10-CM

## 2018-05-30 DIAGNOSIS — I10 Essential (primary) hypertension: Secondary | ICD-10-CM

## 2018-05-30 DIAGNOSIS — I639 Cerebral infarction, unspecified: Secondary | ICD-10-CM

## 2018-05-30 MED ORDER — LOSARTAN POTASSIUM 100 MG PO TABS: 100.0000 mg | ORAL_TABLET | Freq: Every day | ORAL | 3 refills | Status: DC

## 2018-05-30 MED ORDER — INSULIN GLARGINE 100 UNIT/ML ~~LOC~~ SOLN: 16.0000 [IU] | Freq: Every day | SUBCUTANEOUS | 5 refills | Status: DC

## 2018-05-30 MED FILL — LOSARTAN POTASSIUM 100 MG T: 100 | 30 days supply | Qty: 30 | Fill #0

## 2018-05-30 MED FILL — $LANTUS 100 UNITS/ML VIAL: 100 | 90 days supply | Qty: 30 | Fill #0

## 2018-05-30 NOTE — Progress Notes (Signed)
Virtual Visit via Telephone Note  I connected with April Fry on 05/30/18 at  3:50 PM EDT by telephone and verified that I am speaking with the correct person using two identifiers.   I discussed the limitations, risks, security and privacy concerns of performing an evaluation and management service by telephone and the availability of in person appointments. I also discussed with the patient that there may be a patient responsible charge related to this service. The patient expressed understanding and agreed to proceed.  Patient location: Home Provider location: Office Call was initiated by Guillermina Cityctavia Richard, CMA who also contacted [employer] to assist with today's telehealth visit due to a language barrier  History of Present Illness:      58 yo female with  Type 2 DM, Hypertention and is s/p a cerebellar CVA with residual issues with her gait and balance.  Past Medical History:  Diagnosis Date  . Diabetes mellitus without complication (HCC)   . Hypertension   . Pneumonia 05/21/2016   Past Surgical History:  Procedure Laterality Date  . ABDOMINAL HYSTERECTOMY    . LOOP RECORDER INSERTION N/A 04/11/2018   Procedure: LOOP RECORDER INSERTION;  Surgeon: Marinus Mawaylor, Gregg W, MD;  [facility];  Service: Cardiovascular;  Laterality: N/A;  . TEE WITHOUT CARDIOVERSION N/A 02/08/2018   Procedure: TRANSESOPHAGEAL ECHOCARDIOGRAM (TEE);  Surgeon: Jake BatheSkains, Mark C, MD;  [facility];  Service: Cardiovascular;  Laterality: N/A;   Family History  Problem Relation Age of Onset  . Other Mother        parents passed away in an earthquake in 471980's  . Heart attack Sister   . Obesity Sister    Social History   Tobacco Use  . Smoking status: Never Smoker  . Smokeless tobacco: Never Used  Substance Use Topics  . Alcohol use: Yes    Comment: on rare occasions.   . Drug use: No   No Known Allergies   Observations/Objective: No vital signs or physical  exam done at today's visit as visit was conducted via telephone due to limitations/restrictions on-office visits due to the current COVID-19 pandemic  Assessment and Plan: 1. Type 2 diabetes mellitus with hyperglycemia, without long-term current use of insulin (HCC) Patient with type 2 diabetes with most recent hemoglobin A1c of 8.9 in December.  Discussed A1c goal of 7.5 or less.  Patient is to continue the use of Lantus which was refilled and patient is encouraged to follow a low carbohydrate diet.  Patient also provided with refill of losartan.  Once it is safe for patient to come to the office she will have lab work and follow-up of her diabetes and other medical issues. - losartan (COZAAR) 100 MG tablet; Take 1 tablet (100 mg total) by mouth daily. To lower blood pressure  Dispense: 90 tablet; Refill: 3 - insulin glargine (LANTUS) 100 UNIT/ML injection; Inject 0.16 mLs (16 Units total) into the skin at bedtime.  Dispense: 10 mL; Refill: 5  2. Essential hypertension Prescription provided for Cozaar for continued treatment of hypertension.  Patient is encouraged to continue to monitor her blood pressure with blood pressure goal of 130/80 or less - losartan (COZAAR) 100 MG tablet; Take 1 tablet (100 mg total) by mouth daily. To lower blood pressure  Dispense: 90 tablet; Refill: 3  3. Cerebellar stroke [facility](HCC) Patient with history of cerebellar stroke and patient with residual issues with balance status post CVA.  Patient will continue fall precautions.  Refill provided of her  blood pressure medication.  Patient will need upcoming appointment for blood work including CMP, hemoglobin A1c and lipid panel.  Patient will also have nurse visit blood pressure check. - losartan (COZAAR) 100 MG tablet; Take 1 tablet (100 mg total) by mouth daily. To lower blood pressure  Dispense: 90 tablet; Refill: 3  Follow Up Instructions:Return in about 4 weeks (around 06/27/2018) for lab visit and nurse visit for BP  check.    I discussed the assessment and treatment plan with the patient. The patient was provided an opportunity to ask questions and all were answered. The patient agreed with the plan and demonstrated an understanding of the instructions.   The patient was advised to call back or seek an in-person evaluation if the symptoms worsen or if the condition fails to improve as anticipated.  I provided 16 minutes of non-face-to-face time during this encounter.   Cain Saupe, MD

## 2018-05-30 NOTE — Progress Notes (Signed)
DM follow up and her sugar right now is 199 to 153  Sugar yesterday was 193 at night

## 2018-06-01 ENCOUNTER — Encounter: Payer: Self-pay | Admitting: Family Medicine

## 2018-06-12 ENCOUNTER — Encounter: Payer: Self-pay | Admitting: Family Medicine

## 2018-06-16 ENCOUNTER — Ambulatory Visit (INDEPENDENT_AMBULATORY_CARE_PROVIDER_SITE_OTHER): Payer: Self-pay | Admitting: *Deleted

## 2018-06-16 ENCOUNTER — Other Ambulatory Visit: Payer: Self-pay

## 2018-06-16 DIAGNOSIS — I639 Cerebral infarction, unspecified: Secondary | ICD-10-CM

## 2018-06-16 LAB — CUP PACEART REMOTE DEVICE CHECK
Date Time Interrogation Session: 20200507103041
Implantable Pulse Generator Implant Date: 20200302

## 2018-06-21 NOTE — Progress Notes (Signed)
Carelink Summary Report / Loop Recorder 

## 2018-06-23 MED FILL — LOSARTAN POTASSIUM 100 MG T: 100 | 30 days supply | Qty: 30 | Fill #1

## 2018-06-23 MED FILL — LABETALOL HCL 200 MG TABLET: 200 | 30 days supply | Qty: 60 | Fill #2

## 2018-06-24 MED FILL — metFORMIN HCL 1000 MG TABS: 1000 | 30 days supply | Qty: 60 | Fill #4

## 2018-06-27 ENCOUNTER — Telehealth: Payer: Self-pay

## 2018-06-27 NOTE — Telephone Encounter (Signed)
Left vm for pts daughter Larkin Ina that her moms visit will be change to video due to COVID 19. I stated we are not doing in office visits at this time. I explain to daughter if she has a cell phone with camera that would be good for the visit. We need her verbal consent for her mom to do video and to file insurance. Need to give her further instructions.

## 2018-06-28 NOTE — Telephone Encounter (Signed)
I called language line that interpreter is not needed tomorrow for visit.  I stated pt will be doing a video visit with daughter and pt.They stated the services will be cancel and to let the interpreter know.

## 2018-06-28 NOTE — Telephone Encounter (Signed)
I called pts daughter Larkin Ina that visit tomorrow will be video due to COVID 19. I stated it will not be a in office visit. I receive verbal consent to do video and to file insurance. The daughter gave verbal consent to do interpretation for her mom tomorrow and gave waiver consent. The daughter service is [employer] for her cell phone. I stated a text message will be sent to her today and ten minutes prior to appt to click on link and type in pts first and last name. I gave appt time. Hilda Lias pts daughter verbalized understanding.

## 2018-06-29 ENCOUNTER — Ambulatory Visit: Payer: Self-pay | Admitting: Adult Health

## 2018-06-29 ENCOUNTER — Other Ambulatory Visit: Payer: Self-pay

## 2018-06-29 NOTE — Telephone Encounter (Signed)
This is copy and pasted from my outgoing email.  From: Sherrie George  Sent: Tuesday, Jun 28, 2018 12:10 PM To: [email or web address] Subject: [id number]$ video appointment with Ethlyn Daniels NP on Jun 29, 2018 at 7:45am  Please click on the link 10 minutes prior to visit   [email or web address]

## 2018-06-29 NOTE — Telephone Encounter (Addendum)
Pt daughter called stating that she and pt have been waiting for about an hour for the provider to come on.  Daughter stated she was given a link for a provider that is not with [facility].  Daughter asked that pt be r/s.  She accepted 06-16 @ 745. What exactly was sent to pt's daughter is  From: Sherrie George  Sent: Tuesday, Jun 28, 2018 12:10 PM To: [phone] Subject: [id number]$ video appointment with Ethlyn Daniels NP on Jun 29, 2018 at 7:45am  Please click on the link 10 minutes prior to visit   [email or web address]

## 2018-07-19 ENCOUNTER — Ambulatory Visit (INDEPENDENT_AMBULATORY_CARE_PROVIDER_SITE_OTHER): Payer: Self-pay | Admitting: *Deleted

## 2018-07-19 DIAGNOSIS — I639 Cerebral infarction, unspecified: Secondary | ICD-10-CM

## 2018-07-19 LAB — CUP PACEART REMOTE DEVICE CHECK
Date Time Interrogation Session: 20200609123401
Implantable Pulse Generator Implant Date: 20200302

## 2018-07-25 MED FILL — LABETALOL HCL 200 MG TABLET: 200 | 30 days supply | Qty: 60 | Fill #3

## 2018-07-25 MED FILL — metFORMIN HCL 1000 MG TABS: 1000 | 30 days supply | Qty: 60 | Fill #5

## 2018-07-26 ENCOUNTER — Ambulatory Visit: Payer: Self-pay | Admitting: Adult Health

## 2018-07-26 ENCOUNTER — Other Ambulatory Visit: Payer: Self-pay

## 2018-07-26 NOTE — Progress Notes (Deleted)
[facility] [address] [place]. [place] [address] [phone]       FOLLOW UP NOTE  Ms. April Fry Date of Birth:  [birth year] Medical Record Number:  [id number]   Reason for Referral:  hospital stroke follow up  Virtual Visit via Video Note  I connected with April Fry on 07/26/18 at  7:45 AM EDT by a video enabled telemedicine application located remotely in my own home and verified that I am speaking with the correct person using two identifiers who was located at their own home accompianed by her daughter who assisted with interpretation as patient is primarily [place] speaking.   Visit scheduled by Katharine Look, RN who discussed the limitations of evaluation and management by telemedicine and the availability of in person appointments. The patient expressed understanding and agreed to proceed.    CHIEF COMPLAINT:  Chief Complaint  Patient presents with   Follow-up    stroke follow up    HPI:  07/26/18 VIRTUAL VISIT  April Fry is a 58 y.o. 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 She was initially scheduled for in office face to face visit but due to [place] [facility] safety precautions, visit transitioned to telemedicine via [email or web address] with patients consent.  She has been stable from a stroke standpoint with residual gait and balance difficulties. Headaches ***. She was started on topamax at prior visit but unfortunately stopped due to side effects. 30 day cardiac event monitor negative for atrial fibrillation therefore underwent loop recorder placement for prolonged monitoring for potential atrial fibrillation on 04/11/18. Loop recorder has not show atrial fibrillation thus far. She continues on aspirin 322m and crestor 246mwithout side effects for secondary stroke prevention. Blood pressure and glucose levels stable with ongoing follow up and management by  PCP, Dr. FuChapman FitchDenies new or worsening stroke/TIA symptoms.     Initial visit 03/31/18:  April Fry being seen today for initial visit in the office for moderate right PICA cerebellar infarct with cerebral edema and mild hydrocephalus embolic pattern secondary to unknown source but suspicion for atrial fibrillation on 01/31/2018. History obtained from patient with interpreter and chart review. Reviewed all radiology images and labs personally.  AprilTalise Arana-Torresis a [age].o.femalewith history ofHTN and diabetes who presented with nausea, vomiting, dizziness, unsteady gait and mental status change.  CT head reviewed and showed large area of low density within the right cerebellar hemisphere compatible with infarct.  NIH stroke scale 17.  Unable to receive IV tPA as she was outside treatment window.  CTA head/neck unremarkable.  MRI head reviewed and showed moderate right PICA cerebellar infarct with cytotoxic edema and petechial hemorrhage with mild effacement fourth ventricle.  Multiple serial CT head obtained showing evolving right PICA infarct without Ht and regional mass-effect without hydrocephalus.  Repeat MRI showed right PICA infarct with fourth ventricle effacement without hydrocephalus.  Carotid Doppler showed bilateral ICA 1 to 39% stenosis and vertebral arteries antegrade.  2D echo showed an EF of 55 to 60% without cardiac source of embolus.  TEE negative for cardiac source of embolus.  Infarct embolic pattern secondary to unknown source but suspicion for atrial fibrillation.  Report of palpitations associated with fatigue and therefore recommended 30-day cardiac event monitor and will hold off on loop until 30-day monitor completed.  Cerebral edema treated with 3% saline which stabilized.  HTN stable and recommended long-term BP goal normotensive range.  LDL 95 and recommended  continuation of rosuvastatin 20 mg daily.  A1c 8.9 and recommended tight glycemic control with close PCP  follow-up for DM management.  Other stroke risk factors include EtOH use, obesity and hypokalemia without reported stroke history.  Recommended aspirin 325 mg for secondary stroke prevention.  She was discharged home in stable condition with recommended home PT/OT.  April Fry is being seen today for hospital follow-up.  She has been stable from a stroke standpoint without worsening or new symptoms with residual deficits of balance difficulties and dizziness.  She does have complaints of left orbital pain which has been present since she was in the ED without reports of worsening. She denies history of migraines or headaches. She describes as a pressure sensation on the left side of her face/orbital area which is constant and at times will radiate up into left temporal area or into her right orbital area.  She denies any visual changes or worsening neurological symptoms with these headaches.  She has been using tramadol and gabapentin for pain which has been providing relief which was prescribed by PCP.  She does endorse limiting daytime activity and remains homebound since her stroke due to fear of falling stating that she was told by a different provider if she has 1 fall she will have another stroke and it will be worse.  She continues to ambulate with her cane when she is outside of her house but while she is in her home, she will stabilize herself with furniture and may not have to use cane.  She initially was receiving home PT/OT but has since been completed.  She has not returned to driving due to ongoing dizziness.  She has completed a 30-day cardiac event monitor on 03/27/2018 (no results at this time).  She continues on aspirin 325 mg without side effects of bleeding or bruising.  Continues on rosuvastatin 20 mg without side effects myalgias.  Blood pressure today 148/73.  Denies new or worsening stroke/TIA symptoms.    ROS:   14 system review of systems performed and negative with exception of  frequent waking, snoring, joint pain, walking difficulty, headache and nervous/anxious  PMH:  Past Medical History:  Diagnosis Date   Diabetes mellitus without complication ([place])    Hypertension    Pneumonia 05/21/2016    PSH:  Past Surgical History:  Procedure Laterality Date   ABDOMINAL HYSTERECTOMY     LOOP RECORDER INSERTION N/A 04/11/2018   Procedure: LOOP RECORDER INSERTION;  Surgeon: Evans Lance, MD;  Location: [facility];  Service: Cardiovascular;  Laterality: N/A;   TEE WITHOUT CARDIOVERSION N/A 02/08/2018   Procedure: TRANSESOPHAGEAL ECHOCARDIOGRAM (TEE);  Surgeon: Jerline Pain, MD;  Location[facility];  Service: Cardiovascular;  Laterality: N/A;    Social History:  Social History   Socioeconomic History   Marital status: Single    Spouse name: Not on file   Number of children: Not on file   Years of education: Not on file   Highest education level: Not on file  Occupational History   Not on file  Social Needs   Financial resource strain: Not on file   Food insecurity    Worry: Not on file    Inability: Not on file   Transportation needs    Medical: Not on file    Non-medical: Not on file  Tobacco Use   Smoking status: Never Smoker   Smokeless tobacco: Never Used  Substance and Sexual Activity   Alcohol use: Yes  Comment: on rare occasions.    Drug use: No   Sexual activity: Not on file  Lifestyle   Physical activity    Days per week: Not on file    Minutes per session: Not on file   Stress: Not on file  Relationships   Social connections    Talks on phone: Not on file    Gets together: Not on file    Attends religious service: Not on file    Active member of club or organization: Not on file    Attends meetings of clubs or organizations: Not on file    Relationship status: Not on file   Intimate partner violence    Fear of current or ex partner: Not on file    Emotionally abused: Not on file     Physically abused: Not on file    Forced sexual activity: Not on file  Other Topics Concern   Not on file  Social History Narrative   Not on file    Family History:  Family History  Problem Relation Age of Onset   Other Mother        parents passed away in an earthquake in 1980's   Heart attack Sister    Obesity Sister     Medications:   Current Outpatient Medications on File Prior to Visit  Medication Sig Dispense Refill   amLODipine (NORVASC) 10 MG tablet Take 1 tablet (10 mg total) by mouth daily. To lower blood pressure 30 tablet 11   aspirin EC 325 MG EC tablet Take 1 tablet (325 mg total) by mouth daily. 30 tablet 0   blood glucose meter kit and supplies KIT Dispense based on patient and insurance preference. Use up to four times daily as directed. (FOR ICD-9 250.00, 250.01). 1 each 0   Blood Glucose Monitoring Suppl (TRUE METRIX METER) w/Device KIT Use to check blood sugar in the morning. 1 kit 0   glipiZIDE (GLUCOTROL XL) 5 MG 24 hr tablet Take 1 tablet (5 mg total) by mouth daily with breakfast. To lower blood sugar (Patient not taking: Reported on 05/30/2018) 30 tablet 6   glucose blood (TRUE METRIX BLOOD GLUCOSE TEST) test strip Use as instructed 100 each 12   insulin glargine (LANTUS) 100 UNIT/ML injection Inject 0.16 mLs (16 Units total) into the skin at bedtime. 10 mL 5   Insulin Syringe-Needle U-100 (TRUEPLUS INSULIN SYRINGE) 31G X 5/16" 0.3 ML MISC Use to inject insulin daily. 100 each 11   labetalol (NORMODYNE) 200 MG tablet Take 1 tablet (200 mg total) by mouth 2 (two) times daily. To lower blood pressure 60 tablet 11   losartan (COZAAR) 100 MG tablet Take 1 tablet (100 mg total) by mouth daily. To lower blood pressure 90 tablet 3   metFORMIN (GLUCOPHAGE) 1000 MG tablet Take 1 tablet (1,000 mg total) by mouth 2 (two) times daily with a meal. (Patient not taking: Reported on 05/30/2018) 180 tablet 3   rosuvastatin (CRESTOR) 20 MG tablet Take 1 tablet (20  mg total) by mouth daily. 30 tablet 2   topiramate (TOPAMAX) 25 MG tablet Take 1 tablet (25 mg total) by mouth daily for 7 days, THEN 1 tablet (25 mg total) 2 (two) times daily for 21 days. (Patient not taking: Reported on 04/18/2018) 49 tablet 0   traMADol (ULTRAM) 50 MG tablet Take 1 tablet (50 mg total) by mouth every 12 (twelve) hours as needed for moderate pain. 60 tablet 1   TRUEPLUS LANCETS 28G MISC  Use to check blood sugar daily. 100 each 11   Current Facility-Administered Medications on File Prior to Visit  Medication Dose Route Frequency Provider Last Rate Last Dose   pneumococcal 13-valent conjugate vaccine (PREVNAR 13) injection 0.5 mL  0.5 mL Intramuscular Once Fredia Beets R, FNP        Allergies:  No Known Allergies   Physical Exam  There were no vitals filed for this visit. There is no height or weight on file to calculate BMI. No exam data present  Depression screen [facility] 2/9 03/03/2018  Decreased Interest 0  Down, Depressed, Hopeless 3  PHQ - 2 Score 3  Altered sleeping 3  Tired, decreased energy 3  Change in appetite 3  Feeling bad or failure about yourself  3  Trouble concentrating 1  Moving slowly or fidgety/restless 0  Suicidal thoughts 0  PHQ-9 Score 16     General: well developed, well nourished, pleasant middle-aged Spanish speaking Hispanic female, seated, in no evident distress Head: head normocephalic and atraumatic.    Neurologic Exam Mental Status: Awake and fully alert. Oriented to place and time. Recent and remote memory intact. Attention span, concentration and fund of knowledge appropriate. Mood and affect appropriate.  Cranial Nerves: Extraocular movements full without nystagmus. Hearing intact to voice. Facial sensation intact. Face, tongue, palate moves normally and symmetrically.  Shoulder shrug symmetric. Motor: No evidence of weakness per drift assessment Sensory.: intact to light touch Coordination: Rapid alternating movements  normal in all extremities. Finger-to-nose and heel-to-shin performed accurately bilaterally. Gait and Station: Arises from chair without difficulty. Stance is normal. Gait demonstrates normal stride length and balance .  Reflexes: UTA     Diagnostic Data (Labs, Imaging, Testing)  CT HEAD WO CONTRAST 01/31/2018 IMPRESSION: 1. Large area of low density within the right cerebral hemisphere is identified compatible with infarct. This is likely subacute in nature and involves much of the entire right PICA territory. Further investigation with CTA of the head and neck and brain MRI is advised.  CT ANGIO HEAD W OR WO CONTRAST CT ANGIO NECK W OR WO CONTRAST 01/31/2018 IMPRESSION: In this patient with inferior cerebellar infarction on the right, there is no evidence of vertebral stenosis, occlusion or dissection. Flow is present in both posterior inferior cerebellar arteries presently. No basilar artery disease or other posterior circulation branch vessel abnormality.  MR BRAIN WO CONTRAST 01/31/2018 IMPRESSION: 1. Moderate size acute Right PICA cerebellar infarct with cytotoxic edema and petechial hemorrhage. Mild posterior fossa mass effect including partial effacement of the 4th ventricle, but no ventriculomegaly or malignant hemorrhagic transformation. 2. No brainstem infarct or other acute intracranial abnormality. 3. Mild to moderate for age nonspecific cerebral white matter signal Changes.  MR BRAIN WO CONTRAST 02/03/2018 (repeat) IMPRESSION: 1. Evolving acute RIGHT cerebellar PICA territory infarct with petechial hemorrhage. Partially effaced fourth ventricle without obstructive hydrocephalus. 2. No LEFT brachium pontis abnormality.  Carotid Doppler  There is 1-39% bilateral ICA stenosis. Vertebral artery flow is antegrade.   2D echocardiogram - Left ventricle: The cavity size was normal. There was mildconcentric hypertrophy. Systolic function was normal.  Theestimated ejection fraction was in the range of 55% to 60%.Wallmotion was normal; there were no regional wall motionabnormalities. Doppler parameters are consistent with abnormalleft ventricular relaxation (grade 1 diastolic dysfunction).Doppler parameters are consistent with indeterminate ventricularfilling pressure. - Aorta: Ascending aortic diameter: 38 mm (S). - Ascending aorta: The ascending aorta was mildly dilated. - Mitral valve: Transvalvular velocity was within the normal range.There was no  evidence for stenosis. There was trivial regurgitation. - Right ventricle: The cavity size was normal. Wall thickness wasnormal. Systolic function was normal. - Tricuspid valve: There was trivial regurgitation. - Pulmonary arteries: Systolic pressure was within the normalrange. PA peak pressure: 32 mm Hg (S).  TEE Left Ventricle:Normal EF Mitral Valve:Trace MR Aortic Valve:Normal Tricuspid Valve:Trace TR Left Atrium:Normal, no thrombus Bubble Contrast Study:negative. No PFO No embolic source identified.    ASSESSMENT: April Fry is a 58 y.o. year old female here with moderate right PICA cerebellar infarct with cerebral edema and mild hydrocephalus embolic pattern on 01/77/9390 secondary to unknown source with atrial fibrillation suspicion. Vascular risk factors include HTN, HLD and DM.  She is being seen today for hospital follow-up with residual deficits of balance difficulties and dizziness.    PLAN:  1. Right PICA infarct: Continue aspirin 325 mg daily  and rosuvastatin 20 mg for secondary stroke prevention. Maintain strict control of hypertension with blood pressure goal below 130/90, diabetes with hemoglobin A1c goal below 6.5% and cholesterol with LDL cholesterol (bad cholesterol) goal below 70 mg/dL.  I also advised the patient to eat a healthy diet with plenty of whole grains, cereals, fruits and vegetables, exercise regularly with at least 30  minutes of continuous activity daily and maintain ideal body weight. Continue to monitor loop recorder for atrial fibrillation.  2. Post stroke balance/dizziness: Referral placed for home PT for additional improvement of ongoing difficulties along with assistance with increasing confidence in her walking abilities.  Long discussion with patient regarding her extreme fear of falling limiting her daily activities and routine.  Highly encouraged increasing activity during the day with preventing falls but did reassure her that if she does experience fall, this will not necessarily ultimately lead to a new worsening stroke.  Fall prevention important due to possibility of potential hemorrhage or injury but her fear of falling cannot limit her activity as her residual deficits will likely worsen and become deconditioned.  Upon assessment, she does have residual mild balance difficulties but otherwise stable with normal walk, turning and tandem walk.  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 Left orbital headache: Likely residual of stroke.  Initiated Topamax 25 mg nightly for 7 days and then Topamax 25 mg twice daily thereafter.  Advised her that side effect could be increased dizziness and fatigue but this is why we will start at a low dose and gradually increase.  Recommended discontinuation of gabapentin as she does not complain of neuropathic pain along with potential side effect of worsening dizziness especially with initiating Topamax 4. HTN: Advised to continue current treatment regimen.  Today's BP 148/73.  Advised to continue to monitor at home along with continued follow-up with PCP for management 5. HLD: Advised to continue current treatment regimen along with continued follow-up with PCP for future prescribing and monitoring of lipid panel 6. DMII: Advised to continue to  monitor glucose levels at home along with continued follow-up with PCP for management and monitoring     Follow up in 3 months or call earlier if needed   Greater than 50% of time during this 25 minute non face to face visit was spent on counseling, explanation of diagnosis of right PICA infarct, reviewing risk factor management of HTN, HLD and DM, long discussion with ongoing deficits and headache, post stroke depression with fear of falling, planning  of further management along with potential future management, and discussion with patient and family answering all questions.    April Fry, AGNP-BC  [facility] [address] [place], [place] [address]  Phone [phone] Fax [phone] Note: This document was prepared with digital dictation and possible smart phrase technology. Any transcriptional errors that result from this process are unintentional.

## 2018-07-27 NOTE — Progress Notes (Signed)
Carelink Summary Report / Loop Recorder 

## 2018-08-22 ENCOUNTER — Ambulatory Visit (INDEPENDENT_AMBULATORY_CARE_PROVIDER_SITE_OTHER): Payer: Self-pay | Admitting: *Deleted

## 2018-08-22 DIAGNOSIS — I639 Cerebral infarction, unspecified: Secondary | ICD-10-CM

## 2018-08-22 LAB — CUP PACEART REMOTE DEVICE CHECK
Date Time Interrogation Session: 20200712133743
Implantable Pulse Generator Implant Date: 20200302

## 2018-08-30 NOTE — Progress Notes (Signed)
Carelink Summary Report / Loop Recorder 

## 2018-09-12 MED FILL — $LANTUS 100 UNITS/ML VIAL: 100 | 90 days supply | Qty: 30 | Fill #1

## 2018-09-12 MED FILL — LABETALOL HCL 200 MG TABLET: 200 | 30 days supply | Qty: 60 | Fill #4

## 2018-09-12 MED FILL — metFORMIN HCL 1000 MG TABS: 1000 | 30 days supply | Qty: 60 | Fill #6

## 2018-09-23 ENCOUNTER — Ambulatory Visit (INDEPENDENT_AMBULATORY_CARE_PROVIDER_SITE_OTHER): Payer: Self-pay | Admitting: *Deleted

## 2018-09-23 DIAGNOSIS — I639 Cerebral infarction, unspecified: Secondary | ICD-10-CM

## 2018-09-23 LAB — CUP PACEART REMOTE DEVICE CHECK
Date Time Interrogation Session: 20200814113633
Implantable Pulse Generator Implant Date: 20200302

## 2018-09-30 NOTE — Progress Notes (Signed)
Carelink Summary Report / Loop Recorder 

## 2018-10-26 ENCOUNTER — Ambulatory Visit (INDEPENDENT_AMBULATORY_CARE_PROVIDER_SITE_OTHER): Payer: Self-pay | Admitting: *Deleted

## 2018-10-26 DIAGNOSIS — I639 Cerebral infarction, unspecified: Secondary | ICD-10-CM

## 2018-10-26 LAB — CUP PACEART REMOTE DEVICE CHECK
Date Time Interrogation Session: 20200916124127
Implantable Pulse Generator Implant Date: 20200302

## 2018-11-01 NOTE — Progress Notes (Signed)
Carelink Summary Report / Loop Recorder 

## 2018-11-09 MED FILL — metFORMIN HCL 1000 MG TABS: 1000 | 30 days supply | Qty: 60 | Fill #7

## 2018-11-09 MED FILL — LABETALOL HCL 200 MG TABLET: 200 | 30 days supply | Qty: 60 | Fill #5

## 2018-11-24 ENCOUNTER — Other Ambulatory Visit: Payer: Self-pay

## 2018-11-24 DIAGNOSIS — Z20822 Contact with and (suspected) exposure to covid-19: Secondary | ICD-10-CM

## 2018-11-25 LAB — NOVEL CORONAVIRUS, NAA: SARS-CoV-2, NAA: NOT DETECTED

## 2018-11-28 ENCOUNTER — Ambulatory Visit (INDEPENDENT_AMBULATORY_CARE_PROVIDER_SITE_OTHER): Payer: Self-pay | Admitting: *Deleted

## 2018-11-28 DIAGNOSIS — I639 Cerebral infarction, unspecified: Secondary | ICD-10-CM

## 2018-11-28 DIAGNOSIS — I447 Left bundle-branch block, unspecified: Secondary | ICD-10-CM

## 2018-11-28 LAB — CUP PACEART REMOTE DEVICE CHECK
Date Time Interrogation Session: 20201019142326
Implantable Pulse Generator Implant Date: 20200302

## 2018-12-06 ENCOUNTER — Ambulatory Visit: Payer: Self-pay | Attending: Family Medicine | Admitting: Family Medicine

## 2018-12-06 ENCOUNTER — Encounter: Payer: Self-pay | Admitting: Family Medicine

## 2018-12-06 ENCOUNTER — Other Ambulatory Visit: Payer: Self-pay

## 2018-12-06 VITALS — BP 165/80 | HR 76 | Temp 98.2°F | Ht 60.0 in | Wt 190.0 lb

## 2018-12-06 DIAGNOSIS — E1159 Type 2 diabetes mellitus with other circulatory complications: Secondary | ICD-10-CM

## 2018-12-06 DIAGNOSIS — E1165 Type 2 diabetes mellitus with hyperglycemia: Secondary | ICD-10-CM

## 2018-12-06 DIAGNOSIS — R350 Frequency of micturition: Secondary | ICD-10-CM

## 2018-12-06 DIAGNOSIS — I10 Essential (primary) hypertension: Secondary | ICD-10-CM

## 2018-12-06 DIAGNOSIS — Z794 Long term (current) use of insulin: Secondary | ICD-10-CM

## 2018-12-06 DIAGNOSIS — R42 Dizziness and giddiness: Secondary | ICD-10-CM

## 2018-12-06 LAB — POCT URINALYSIS DIP (CLINITEK)
Bilirubin, UA: NEGATIVE
Blood, UA: NEGATIVE
Glucose, UA: NEGATIVE mg/dL
Ketones, POC UA: NEGATIVE mg/dL
Leukocytes, UA: NEGATIVE
Nitrite, UA: NEGATIVE
POC PROTEIN,UA: 30 — AB
Spec Grav, UA: >=1.03 — AB (ref 1.010–1.025)
Urobilinogen, UA: 0.2 E.U./dL
pH, UA: 5.5 (ref 5.0–8.0)

## 2018-12-06 LAB — GLUCOSE, POCT (MANUAL RESULT ENTRY): POC Glucose: 180 mg/dl — AB (ref 70–99)

## 2018-12-06 LAB — POCT GLYCOSYLATED HEMOGLOBIN (HGB A1C): HbA1c, POC (controlled diabetic range): 8.4 % — AB (ref 0.0–7.0)

## 2018-12-06 MED ORDER — MECLIZINE HCL 25 MG PO TABS: 25.0000 mg | ORAL_TABLET | Freq: Two times a day (BID) | ORAL | 1 refills | Status: DC | PRN

## 2018-12-06 MED FILL — MECLIZINE 25 MG TABLET: 25 | 30 days supply | Qty: 60 | Fill #0

## 2018-12-06 NOTE — Patient Instructions (Signed)
Vrtigo Vertigo El vrtigo es la sensacin de que usted o las cosas que lo rodean se mueven cuando en realidad eso no sucede. Esta sensacin puede aparecer y desaparecer en cualquier momento. El vrtigo suele desaparecer solo. Esta afeccin puede ser peligrosa si ocurre cuando est realizando actividades como conducir o trabajar con mquinas. El mdico le har pruebas para encontrar la causa del vrtigo. Estas pruebas tambin ayudarn al mdico a decidir cul es el mejor tratamiento para usted. Siga estas indicaciones en su casa: Comida y bebida      Beba suficiente lquido para mantener el pis (la [place]) de color amarillo plido.  No beba alcohol. Actividad  Retome sus actividades habituales como se lo haya indicado el mdico. Pregntele al mdico qu actividades son seguras para usted.  Por la maana, sintese primero a un lado de la cama. Cuando se sienta bien, pngase lentamente de pie mientras se sostiene de algo, hasta que sepa que ha logrado el equilibrio.  Muvase lentamente. Evite determinadas posiciones o hacer movimientos repentinos del cuerpo o de la cabeza, como se lo haya indicado el mdico.  Use un bastn si tiene dificultad para ponerse de pie o caminar.  Si se siente mareado, sintese de inmediato.  Evite realizar tareas o actividades que puedan causarle peligro a usted o a Producer, television/film/video si se marea.  Evite agacharse si se siente mareado. En su casa, coloque los objetos de modo que le resulte fcil alcanzarlos sin Office manager.  No conduzca vehculos ni opere maquinaria pesada si se siente mareado. Indicaciones generales  [employer] de venta libre y los recetados solamente como se lo haya indicado el mdico.  Consulting civil engineer a todas las visitas de control como se lo haya indicado el mdico. Esto es importante. Comunquese con un mdico si:  Los medicamentos no le [employer] vrtigo.  Tiene fiebre.  Los problemas empeoran o le aparecen sntomas nuevos.   Sus familiares o amigos observan cambios en su comportamiento.  La sensacin de Air cabin crew.  Los vmitos empeoran.  Pierde la sensibilidad (tiene entumecimiento) en alguna parte del cuerpo.  Siente pinchazos u hormigueo en alguna parte del cuerpo. Solicite ayuda inmediatamente si:  Tiene dificultad para moverse o para caminar.  Esta mareado todo [employer].  Pierde el conocimiento (se desmaya).  Tiene dolores de [place] muy intensos.  Siente debilidad [employer], los brazos o las piernas.  Tiene cambios en la audicin.  Tiene cambios en la forma de ver (visin).  Tiene rigidez en el cuello.  La luz brillante empieza a molestarlo. Resumen  El vrtigo es la sensacin de que usted o las cosas que lo rodean se mueven cuando en realidad eso no sucede.  El mdico le har pruebas para encontrar la causa del vrtigo.  Tal vez le indiquen que evite algunas tareas, posiciones o movimientos.  Comunquese con un mdico si los medicamentos no lo ayudan o si tiene fiebre, sntomas nuevos o un cambio en el comportamiento.  Pida ayuda de inmediato si tiene dolores de cabeza muy intensos o si tiene cambios en la manera de hablar, or o ver. Esta informacin no tiene Marine scientist el consejo del mdico. Asegrese de hacerle al mdico cualquier pregunta que tenga. Document Released: 02/28/2010 Document Revised: 02/14/2018 Document Reviewed: 02/14/2018 Elsevier Patient Education  [place].

## 2018-12-06 NOTE — Progress Notes (Signed)
Patient states that she got dizzy and fell on the ground  Patient is having frequent  urination.

## 2018-12-06 NOTE — Progress Notes (Signed)
Subjective:  Patient ID: April Fry, female    DOB: [birth year]  Age: 58 y.o. MRN: [id number]  CC: Diabetes   HPI April Fry is a 58 year old female with a history of type 2 diabetes mellitus (A1c 8.4), hypertension, previous CVA, obesity presents today for an acute visit. She complains of dizziness to the point where she fell on the ground 8 days ago.  She describes attempting to get up from a crouching position with resulting dizziness and this also occurs on turning her head from left to right.  Endorses sense of spinning of the room.  She previously had no gait abnormalities or weakness and has no residuals from her CVA per patient.  Denies otalgia, hearing loss, nausea or vomiting and has no headaches. She also complains of urinary frequency but no dysuria, flank pain, abdominal pain or fever. Her blood pressure is elevated today and review of her medications with her is indicative of noncompliance.  Past Medical History:  Diagnosis Date  . Diabetes mellitus without complication ([place])   . Hypertension   . Pneumonia 05/21/2016    Past Surgical History:  Procedure Laterality Date  . ABDOMINAL HYSTERECTOMY    . LOOP RECORDER INSERTION N/A 04/11/2018   Procedure: LOOP RECORDER INSERTION;  Surgeon: Evans Lance, MD;  Location: [place] [facility];  Service: Cardiovascular;  Laterality: N/A;  . TEE WITHOUT CARDIOVERSION N/A 02/08/2018   Procedure: TRANSESOPHAGEAL ECHOCARDIOGRAM (TEE);  Surgeon: Jerline Pain, MD;  Location: [facility] ENDOSCOPY;  Service: Cardiovascular;  Laterality: N/A;    Family History  Problem Relation Age of Onset  . Other Mother        parents passed away in an [place] in 72's  . Heart attack Sister   . Obesity Sister     No Known Allergies  Outpatient Medications Prior to Visit  Medication Sig Dispense Refill  . amLODipine (NORVASC) 10 MG tablet Take 1 tablet (10 mg total) by mouth daily. To lower blood pressure 30 tablet 11  . aspirin  EC 325 MG EC tablet Take 1 tablet (325 mg total) by mouth daily. 30 tablet 0  . insulin glargine (LANTUS) 100 UNIT/ML injection Inject 0.16 mLs (16 Units total) into the skin at bedtime. 10 mL 5  . labetalol (NORMODYNE) 200 MG tablet Take 1 tablet (200 mg total) by mouth 2 (two) times daily. To lower blood pressure 60 tablet 11  . losartan (COZAAR) 100 MG tablet Take 1 tablet (100 mg total) by mouth daily. To lower blood pressure 90 tablet 3  . blood glucose meter kit and supplies KIT Dispense based on patient and insurance preference. Use up to four times daily as directed. (FOR ICD-9 250.00, 250.01). (Patient not taking: Reported on 12/06/2018) 1 each 0  . Blood Glucose Monitoring Suppl (TRUE METRIX METER) w/Device KIT Use to check blood sugar in the morning. (Patient not taking: Reported on 12/06/2018) 1 kit 0  . glipiZIDE (GLUCOTROL XL) 5 MG 24 hr tablet Take 1 tablet (5 mg total) by mouth daily with breakfast. To lower blood sugar (Patient not taking: Reported on 05/30/2018) 30 tablet 6  . glucose blood (TRUE METRIX BLOOD GLUCOSE TEST) test strip Use as instructed (Patient not taking: Reported on 12/06/2018) 100 each 12  . Insulin Syringe-Needle U-100 (TRUEPLUS INSULIN SYRINGE) 31G X 5/16" 0.3 ML MISC Use to inject insulin daily. (Patient not taking: Reported on 12/06/2018) 100 each 11  . metFORMIN (GLUCOPHAGE) 1000 MG tablet Take 1 tablet (1,000 mg total) by mouth  2 (two) times daily with a meal. (Patient not taking: Reported on 05/30/2018) 180 tablet 3  . rosuvastatin (CRESTOR) 20 MG tablet Take 1 tablet (20 mg total) by mouth daily. (Patient not taking: Reported on 12/06/2018) 30 tablet 2  . topiramate (TOPAMAX) 25 MG tablet Take 1 tablet (25 mg total) by mouth daily for 7 days, THEN 1 tablet (25 mg total) 2 (two) times daily for 21 days. (Patient not taking: Reported on 04/18/2018) 49 tablet 0  . traMADol (ULTRAM) 50 MG tablet Take 1 tablet (50 mg total) by mouth every 12 (twelve) hours as needed  for moderate pain. (Patient not taking: Reported on 12/06/2018) 60 tablet 1  . TRUEPLUS LANCETS 28G MISC Use to check blood sugar daily. (Patient not taking: Reported on 12/06/2018) 100 each 11   Facility-Administered Medications Prior to Visit  Medication Dose Route Frequency Provider Last Rate Last Dose  . pneumococcal 13-valent conjugate vaccine (PREVNAR 13) injection 0.5 mL  0.5 mL Intramuscular Once Hairston, Mandesia R, FNP         ROS Review of Systems  Constitutional: Negative for activity change, appetite change and fatigue.  HENT: Negative for congestion, sinus pressure and sore throat.   Eyes: Negative for visual disturbance.  Respiratory: Negative for cough, chest tightness, shortness of breath and wheezing.   Cardiovascular: Negative for chest pain and palpitations.  Gastrointestinal: Negative for abdominal distention, abdominal pain and constipation.  Endocrine: Negative for polydipsia.  Genitourinary: Positive for frequency. Negative for dysuria.  Musculoskeletal: Negative for arthralgias and back pain.  Skin: Negative for rash.  Neurological: Positive for light-headedness. Negative for tremors and numbness.  Hematological: Does not bruise/bleed easily.  Psychiatric/Behavioral: Negative for agitation and behavioral problems.    Objective:  BP (!) 165/80   Pulse 76   Temp 98.2 F (36.8 C) (Oral)   Ht 5' (1.524 m)   Wt 190 lb (86.2 kg)   SpO2 98%   BMI 37.11 kg/m   BP/Weight 12/06/2018 04/11/2018 02/28/1476  Systolic BP 295 - -  Diastolic BP 80 - -  Wt. (Lbs) 190 191 190.8  BMI 37.11 37.3 37.26      Physical Exam Constitutional:      Appearance: She is well-developed.  Neck:     Vascular: No JVD.  Cardiovascular:     Rate and Rhythm: Normal rate.     Heart sounds: Normal heart sounds. No murmur.  Pulmonary:     Effort: Pulmonary effort is normal.     Breath sounds: Normal breath sounds. No wheezing or rales.  Chest:     Chest wall: No tenderness.   Abdominal:     General: Bowel sounds are normal. There is no distension.     Palpations: Abdomen is soft. There is no mass.     Tenderness: There is no abdominal tenderness. There is no right CVA tenderness or left CVA tenderness.  Musculoskeletal: Normal range of motion.     Right lower leg: No edema.     Left lower leg: No edema.  Neurological:     Mental Status: She is alert and oriented to person, place, and time.  Psychiatric:        Mood and Affect: Mood normal.     CMP Latest Ref Rng & Units 03/03/2018 02/08/2018 02/07/2018  Glucose 65 - 99 mg/dL 136(H) 164(H) 235(H)  BUN 6 - 24 mg/dL _0 Creatinine 0.57 - 1.00 mg/dL 0.44(L) 0.51 0.54  Sodium 134 - 144 mmol/L 142 138 137  Potassium 3.5 - 5.2 mmol/L 4.2 4.0 3.7  Chloride 96 - 106 mmol/L 101 102 102  CO2 20 - 29 mmol/L 19(L) 27 25  Calcium 8.7 - 10.2 mg/dL 9.4 9.1 9.2  Total Protein 6.5 - 8.1 g/dL - - -  Total Bilirubin 0.3 - 1.2 mg/dL - - -  Alkaline Phos 38 - 126 U/L - - -  AST 15 - 41 U/L - - -  ALT 0 - 44 U/L - - -    Lipid Panel     Component Value Date/Time   CHOL 158 02/01/2018 0011   CHOL 199 11/25/2017 1040   TRIG 69 02/01/2018 0011   HDL 49 02/01/2018 0011   HDL 48 11/25/2017 1040   CHOLHDL 3.2 02/01/2018 0011   VLDL 14 02/01/2018 0011   LDLCALC 95 02/01/2018 0011   LDLCALC 128 (H) 11/25/2017 1040    CBC    Component Value Date/Time   WBC 8.0 02/08/2018 0455   RBC 4.46 02/08/2018 0455   HGB 12.8 02/08/2018 0455   HCT 39.1 02/08/2018 0455   PLT 262 02/08/2018 0455   MCV 87.7 02/08/2018 0455   MCH 28.7 02/08/2018 0455   MCHC 32.7 02/08/2018 0455   RDW 12.7 02/08/2018 0455   LYMPHSABS 1.1 01/31/2018 1035   MONOABS 0.3 01/31/2018 1035   EOSABS 0.0 01/31/2018 1035   BASOSABS 0.0 01/31/2018 1035    Lab Results  Component Value Date   HGBA1C 8.4 (A) 12/06/2018    Assessment & Plan:   1. Type 2 diabetes mellitus with other circulatory complication, with long-term current use of insulin  (HCC) Uncontrolled with A1c of 8.4 Poor compliance also contributory I have explained today with the aid of interpreter that her medications are at the pharmacy and she will just need to request refills Follow-up with PCP for management of diabetes and review of medications Emphasized the need to be compliant with a diabetic diet, lifestyle modification - POCT glucose (manual entry) - POCT glycosylated hemoglobin (Hb A1C)  2. Frequent urination Urinalysis negative for UTI Possibly secondary to hyperglycemia - POCT URINALYSIS DIP (CLINITEK)  3. Vertigo Advised to change positions slowly - meclizine (ANTIVERT) 25 MG tablet; Take 1 tablet (25 mg total) by mouth 2 (two) times daily as needed for dizziness.  Dispense: 60 tablet; Refill: 1  4. Essential hypertension Elevated blood pressure but from review of her medications she has not been fully compliant Return with all her medications for PCP follow-up and possible regimen change if indicated Counseled on blood pressure goal of less than 130/80, low-sodium, DASH diet, medication compliance, 150 minutes of moderate intensity exercise per week. Discussed medication compliance, adverse effects.    Meds ordered this encounter  Medications  . meclizine (ANTIVERT) 25 MG tablet    Sig: Take 1 tablet (25 mg total) by mouth 2 (two) times daily as needed for dizziness.    Dispense:  60 tablet    Refill:  1    Follow-up: Return in about 1 month (around 01/06/2019) for PCP - Dr Chapman Fitch for medical conditions.       Charlott Rakes, MD, FAAFP. [facility] and [place], [place]   12/06/2018, 9:46 AM

## 2018-12-07 ENCOUNTER — Encounter: Payer: Self-pay | Admitting: Family Medicine

## 2018-12-16 NOTE — Progress Notes (Signed)
Carelink Summary Report / Loop Recorder 

## 2019-01-01 LAB — CUP PACEART REMOTE DEVICE CHECK
Date Time Interrogation Session: 20201121101213
Implantable Pulse Generator Implant Date: 20200302

## 2019-01-02 ENCOUNTER — Ambulatory Visit (INDEPENDENT_AMBULATORY_CARE_PROVIDER_SITE_OTHER): Payer: Self-pay | Admitting: *Deleted

## 2019-01-02 ENCOUNTER — Other Ambulatory Visit: Payer: Self-pay | Admitting: Family Medicine

## 2019-01-02 DIAGNOSIS — I639 Cerebral infarction, unspecified: Secondary | ICD-10-CM

## 2019-01-12 ENCOUNTER — Other Ambulatory Visit: Payer: Self-pay

## 2019-01-12 ENCOUNTER — Encounter: Payer: Self-pay | Admitting: Family Medicine

## 2019-01-12 ENCOUNTER — Ambulatory Visit: Payer: Self-pay | Attending: Family Medicine | Admitting: Family Medicine

## 2019-01-12 DIAGNOSIS — E1159 Type 2 diabetes mellitus with other circulatory complications: Secondary | ICD-10-CM

## 2019-01-12 DIAGNOSIS — R42 Dizziness and giddiness: Secondary | ICD-10-CM

## 2019-01-12 DIAGNOSIS — R350 Frequency of micturition: Secondary | ICD-10-CM

## 2019-01-12 DIAGNOSIS — Z603 Acculturation difficulty: Secondary | ICD-10-CM

## 2019-01-12 DIAGNOSIS — Z8673 Personal history of transient ischemic attack (TIA), and cerebral infarction without residual deficits: Secondary | ICD-10-CM

## 2019-01-12 DIAGNOSIS — Z789 Other specified health status: Secondary | ICD-10-CM

## 2019-01-12 DIAGNOSIS — Z794 Long term (current) use of insulin: Secondary | ICD-10-CM

## 2019-01-12 DIAGNOSIS — Z758 Other problems related to medical facilities and other health care: Secondary | ICD-10-CM

## 2019-01-12 DIAGNOSIS — I10 Essential (primary) hypertension: Secondary | ICD-10-CM

## 2019-01-12 MED ORDER — INSULIN GLARGINE 100 UNIT/ML ~~LOC~~ SOLN: 20.0000 [IU] | Freq: Every day | SUBCUTANEOUS | 5 refills | Status: DC

## 2019-01-12 MED ORDER — "INSULIN SYRINGE-NEEDLE U-100 31G X 5/16"" 0.3 ML MISC": 11 refills | Status: DC

## 2019-01-12 MED ORDER — SULFAMETHOXAZOLE-TRIMETHOPRIM 800-160 MG PO TABS: 1.0000 | ORAL_TABLET | Freq: Two times a day (BID) | ORAL | 0 refills | Status: DC

## 2019-01-12 MED FILL — $LANTUS 100 UNITS/ML VIAL: 100 | 28 days supply | Qty: 10 | Fill #0

## 2019-01-12 MED FILL — SULFAMETHOXAZOLE-TMP DS TAB: 800-160 | 3 days supply | Qty: 6 | Fill #0

## 2019-01-12 MED FILL — TRUEPLUS SYR 0.3ML 31GX5/16: 31G X 5/16" | 25 days supply | Qty: 100 | Fill #0

## 2019-01-12 NOTE — Progress Notes (Signed)
Virtual Visit via Telephone Note  I connected with April Fry on 01/12/19 at  8:30 AM EST by telephone and verified that I am speaking with the correct person using two identifiers.   I discussed the limitations, risks, security and privacy concerns of performing an evaluation and management service by telephone and the availability of in person appointments. I also discussed with the patient that there may be a patient responsible charge related to this service. The patient expressed understanding and agreed to proceed.  Patient Location: Home Provider Location: [facility] Others participating in call:    History of Present Illness:      58 yo female who is being seen in follow-up of a recent visit on 12/06/2018 with another provider when patient was seen in follow-up of diabetes, hypertension and complaint of dizziness.  Patient reports that she continues to have issues with recurrent dizziness.  Dizziness generally occurs when she wakes up in the morning and is sitting on the side of her bed and then attempts to stand up.  She states that when she tries to stand up she will get dizzy and fall back onto the bed.  Patient states that this has been going on for about 2 months.  She states that she has gotten used to it somewhat and now will make sure that she sits on the edge of the bed for a few minutes before attempting to stand up and she prepares herself in case she falls back against the bed.  She reports that this dizziness is different from the sudden onset of dizziness leading to a fall which she had with her prior stroke.       She also reports that she has recurrent urinary frequency.  She states that she has stopped drinking as much water because now as soon as she drinks water it goes straight through her and she has to go to the bathroom.  She denies any lower abdominal pain, no increased back pain and no dysuria.  She is currently on Lantus 16 units daily.  She reports that her  other medications, Metformin and Glucotrol were stopped and she cannot get additional refills from the pharmacy.  She does not check her fasting blood sugars but checks her blood sugars shortly after her afternoon meal.  Blood sugars are remaining in the 220s to 260s.  She denies any increased thirst or blurred vision.         She reports that she is monitoring her blood sugars at home and they are now in the 140s over 90s or less.  She denies any headaches or dizziness related to her blood pressure.  She has had no chest pain or palpitations and no shortness of breath or cough.   Past Medical History:  Diagnosis Date  . Diabetes mellitus without complication (HCC)   . Hypertension   . Pneumonia 05/21/2016    Past Surgical History:  Procedure Laterality Date  . ABDOMINAL HYSTERECTOMY    . LOOP RECORDER INSERTION N/A 04/11/2018   Procedure: LOOP RECORDER INSERTION;  Surgeon: Marinus Maw, MD;  Location: [facility] INVASIVE CV LAB;  Service: Cardiovascular;  Laterality: N/A;  . TEE WITHOUT CARDIOVERSION N/A 02/08/2018   Procedure: TRANSESOPHAGEAL ECHOCARDIOGRAM (TEE);  Surgeon: Jake Bathe, MD;  Location: [facility] ENDOSCOPY;  Service: Cardiovascular;  Laterality: N/A;    Family History  Problem Relation Age of Onset  . Other Mother        parents passed away in an earthquake  in 1980's  . Heart attack Sister   . Obesity Sister     Social History   Tobacco Use  . Smoking status: Never Smoker  . Smokeless tobacco: Never Used  Substance Use Topics  . Alcohol use: Yes    Comment: on rare occasions.   . Drug use: No     No Known Allergies     Observations/Objective: No vital signs or physical exam conducted as visit was done via telephone  Assessment and Plan: 1. Dizziness Patient reports continued dizziness.  She will be referred to neurology for further evaluation.  She is encouraged to remain well-hydrated to help with her blood sugars as her dizziness may also be related to dehydration.   Patient had recent electrolytes which were within normal.  She does report continued elevated blood sugars. - Ambulatory referral to Neurology  2. History of cerebellar stroke Patient with history of cerebellar stroke with secondary balance impairment and patient now with complaint of recurrent dizziness.  She has been referred to neurology for further evaluation to see if her dizziness may be a separate problem such as vertigo versus related to new stroke.  3. Urinary frequency Patient with urinary frequency which may be due to elevated blood sugars but could also be urinary tract infection which is contributing to increase in blood sugars.  Prescription sent to patient's pharmacy for Septra DS twice daily x3 days in case of uncomplicated urinary tract infection.  Patient's dose of Lantus is also being increased to help lower blood sugars which may also help with urinary frequency.  4. Type 2 diabetes mellitus with other circulatory complication, with long-term current use of insulin (HCC) Patient's most recent hemoglobin A1c was 8.4 she continues to have elevated home blood sugars.  She has been asked to increase her Lantus to 20 units daily.  She will also follow-up with the clinical pharmacist in the next 1 to 2 weeks to see if her blood sugars have improved.  5. Essential hypertension Blood pressure was elevated at her last visit on 12/06/2018 but she reports that she is monitoring blood pressure at home and blood pressure is now 140/90 or less.  She will follow-up with clinical pharmacist in approximately 2 weeks for blood pressure recheck and to see if medications need further adjustment.  Follow Up Instructions: 1 to 2 weeks with clinical pharmacist in follow-up of hypertension and diabetes; follow-up with neurology; 47-month office follow-up    I discussed the assessment and treatment plan with the patient. The patient was provided an opportunity to ask questions and all were answered. The  patient agreed with the plan and demonstrated an understanding of the instructions.   The patient was advised to call back or seek an in-person evaluation if the symptoms worsen or if the condition fails to improve as anticipated.  I provided 22 minutes of non-face-to-face time during this encounter.   Antony Blackbird, MD

## 2019-01-12 NOTE — Progress Notes (Signed)
Patient verified DOB Patient has not eaten today. Patient has not taken medication today. Patient denies pain at this time. Patient checked her CBG yesterday evening and it was 260

## 2019-02-01 ENCOUNTER — Ambulatory Visit (INDEPENDENT_AMBULATORY_CARE_PROVIDER_SITE_OTHER): Payer: Self-pay | Admitting: *Deleted

## 2019-02-01 DIAGNOSIS — I639 Cerebral infarction, unspecified: Secondary | ICD-10-CM

## 2019-02-01 LAB — CUP PACEART REMOTE DEVICE CHECK
Date Time Interrogation Session: 20201222231636
Implantable Pulse Generator Implant Date: 20200302

## 2019-03-06 ENCOUNTER — Ambulatory Visit (INDEPENDENT_AMBULATORY_CARE_PROVIDER_SITE_OTHER): Payer: Self-pay | Admitting: *Deleted

## 2019-03-06 DIAGNOSIS — I639 Cerebral infarction, unspecified: Secondary | ICD-10-CM

## 2019-03-06 LAB — CUP PACEART REMOTE DEVICE CHECK
Date Time Interrogation Session: 20210124233912
Implantable Pulse Generator Implant Date: 20200302

## 2019-04-06 ENCOUNTER — Ambulatory Visit (INDEPENDENT_AMBULATORY_CARE_PROVIDER_SITE_OTHER): Payer: Self-pay | Admitting: *Deleted

## 2019-04-06 DIAGNOSIS — I639 Cerebral infarction, unspecified: Secondary | ICD-10-CM

## 2019-04-06 LAB — CUP PACEART REMOTE DEVICE CHECK
Date Time Interrogation Session: 20210225000821
Implantable Pulse Generator Implant Date: 20200302

## 2019-04-06 NOTE — Progress Notes (Signed)
ILR Remote 

## 2019-04-21 MED FILL — $LANTUS 100 UNITS/ML VIAL: 100 | 28 days supply | Qty: 10 | Fill #1

## 2019-04-21 MED FILL — metFORMIN HCL 1000 MG TABS: 1000 | 30 days supply | Qty: 60 | Fill #0

## 2019-04-27 ENCOUNTER — Ambulatory Visit: Payer: Self-pay | Attending: Family Medicine | Admitting: Physician Assistant

## 2019-04-27 ENCOUNTER — Other Ambulatory Visit: Payer: Self-pay

## 2019-04-27 VITALS — BP 149/83 | HR 83 | Temp 99.0°F | Ht 60.0 in | Wt 193.8 lb

## 2019-04-27 DIAGNOSIS — Z794 Long term (current) use of insulin: Secondary | ICD-10-CM

## 2019-04-27 DIAGNOSIS — I10 Essential (primary) hypertension: Secondary | ICD-10-CM

## 2019-04-27 DIAGNOSIS — I639 Cerebral infarction, unspecified: Secondary | ICD-10-CM

## 2019-04-27 DIAGNOSIS — E1165 Type 2 diabetes mellitus with hyperglycemia: Secondary | ICD-10-CM

## 2019-04-27 DIAGNOSIS — E1159 Type 2 diabetes mellitus with other circulatory complications: Secondary | ICD-10-CM

## 2019-04-27 LAB — GLUCOSE, POCT (MANUAL RESULT ENTRY): POC Glucose: 262 mg/dl — AB (ref 70–99)

## 2019-04-27 LAB — POCT GLYCOSYLATED HEMOGLOBIN (HGB A1C): Hemoglobin A1C: 10.4 % — AB (ref 4.0–5.6)

## 2019-04-27 MED ORDER — ASPIRIN 325 MG PO TBEC: 325.0000 mg | DELAYED_RELEASE_TABLET | Freq: Every day | ORAL | 0 refills | Status: DC

## 2019-04-27 MED ORDER — "INSULIN SYRINGE-NEEDLE U-100 31G X 5/16"" 0.3 ML MISC": 11 refills | Status: DC

## 2019-04-27 MED ORDER — LOSARTAN POTASSIUM 100 MG PO TABS: 100.0000 mg | ORAL_TABLET | Freq: Every day | ORAL | 3 refills | Status: DC

## 2019-04-27 MED ORDER — LABETALOL HCL 200 MG PO TABS: 200.0000 mg | ORAL_TABLET | Freq: Two times a day (BID) | ORAL | 11 refills | Status: DC

## 2019-04-27 MED ORDER — GLIPIZIDE ER 5 MG PO TB24: 5.0000 mg | ORAL_TABLET | Freq: Every day | ORAL | 6 refills | Status: DC

## 2019-04-27 MED ORDER — AMLODIPINE BESYLATE 10 MG PO TABS: 10.0000 mg | ORAL_TABLET | Freq: Every day | ORAL | 11 refills | Status: DC

## 2019-04-27 MED ORDER — ROSUVASTATIN CALCIUM 20 MG PO TABS: 20.0000 mg | ORAL_TABLET | Freq: Every day | ORAL | 2 refills | Status: DC

## 2019-04-27 MED ORDER — METFORMIN HCL 1000 MG PO TABS: ORAL_TABLET | ORAL | 3 refills | Status: DC

## 2019-04-27 MED ORDER — TRUE METRIX BLOOD GLUCOSE TEST VI STRP: ORAL_STRIP | 12 refills | Status: DC

## 2019-04-27 MED ORDER — INSULIN GLARGINE 100 UNIT/ML ~~LOC~~ SOLN: 20.0000 [IU] | Freq: Every day | SUBCUTANEOUS | 5 refills | Status: DC

## 2019-04-27 MED FILL — LOSARTAN POTASSIUM 100 MG T: 100 | 30 days supply | Qty: 30 | Fill #0

## 2019-04-27 MED FILL — AMLODIPINE BESYLATE 10 MG T: 10 | 30 days supply | Qty: 30 | Fill #0

## 2019-04-27 MED FILL — LABETALOL HCL 200 MG TABS: 200 | 30 days supply | Qty: 60 | Fill #0

## 2019-04-27 MED FILL — TRUE METRIX GLUCOSE TEST ST: 50 days supply | Qty: 100 | Fill #0

## 2019-04-27 MED FILL — TRUEPLUS SYR 1ML 31GX5/16: 31G X 5/16" | 90 days supply | Qty: 100 | Fill #0

## 2019-04-27 MED FILL — glipiZIDE XL 5 MG TB24: 5 | 30 days supply | Qty: 30 | Fill #0

## 2019-04-27 MED FILL — TRUEPLUS SYR 1ML 31GX5/16": 31G X 5/16" | 90 days supply | Qty: 100 | Fill #0

## 2019-04-27 MED FILL — ROSUVASTATIN CALCIUM 20 MG: 20 | 30 days supply | Qty: 30 | Fill #0

## 2019-04-27 NOTE — Progress Notes (Signed)
Dizziness  Med refills

## 2019-04-27 NOTE — Progress Notes (Signed)
April Fry, is a 59 y.o. female  EPP:[id number]  SAY:[id number]  DOB - 07-27-1960  Subjective:  Chief Complaint and HPI: April Fry is a 59 y.o. female here today and has been out of meds for about 2 months.  She has her meter with her and her blood sugars are running bt 200-390.  She endorses polyuria/polydipsia and blurry vision.  She has never seen an ophthalmologist.  When I asked her why she went so long without medications she said bc the "pharmacy didn't have any more for me and I ran out."  She says she understands that she is supposed to always be taking them but can't really explain why she didn't request more.  She has some dizziness.    Geralyn Flash with [employer] interpreters translating  ROS:   Constitutional:  No f/c, No night sweats, No unexplained weight loss. EENT:  No vision changes, No blurry vision, No hearing changes. No mouth, throat, or ear problems.  Respiratory: No cough, No SOB Cardiac: No CP, no palpitations GI:  No abd pain, No N/V/D. GU: No Urinary s/sx Musculoskeletal: No joint pain Neuro: No headache, + dizziness, no motor weakness.  Skin: No rash Endocrine:  + polydipsia. +polyuria.  Psych: Denies SI/HI  No problems updated.  ALLERGIES: No Known Allergies  PAST MEDICAL HISTORY: Past Medical History:  Diagnosis Date  . Diabetes mellitus without complication ([place])   . Hypertension   . Pneumonia 05/21/2016    MEDICATIONS AT HOME: Prior to Admission medications   Medication Sig Start Date End Date Taking? Authorizing Provider  amLODipine (NORVASC) 10 MG tablet Take 1 tablet (10 mg total) by mouth daily. To lower blood pressure 04/27/19   Freeman Caldron M, PA-C  aspirin 325 MG EC tablet Take 1 tablet (325 mg total) by mouth daily. 04/27/19   Argentina Donovan, PA-C  blood glucose meter kit and supplies KIT Dispense based on patient and insurance preference. Use up to four times daily as directed. (FOR ICD-9 250.00, 250.01). 05/23/16    Velvet Bathe, MD  Blood Glucose Monitoring Suppl (TRUE METRIX METER) w/Device KIT Use to check blood sugar in the morning. 12/16/17   Fulp, Cammie, MD  glipiZIDE (GLUCOTROL XL) 5 MG 24 hr tablet Take 1 tablet (5 mg total) by mouth daily with breakfast. To lower blood sugar 04/27/19   Argentina Donovan, PA-C  glucose blood (TRUE METRIX BLOOD GLUCOSE TEST) test strip Use as instructed 04/27/19   Argentina Donovan, PA-C  insulin glargine (LANTUS) 100 UNIT/ML injection Inject 0.2 mLs (20 Units total) into the skin at bedtime. 04/27/19   Argentina Donovan, PA-C  Insulin Syringe-Needle U-100 (TRUEPLUS INSULIN SYRINGE) 31G X 5/16" 0.3 ML MISC Use to inject insulin daily. 04/27/19   Argentina Donovan, PA-C  labetalol (NORMODYNE) 200 MG tablet Take 1 tablet (200 mg total) by mouth 2 (two) times daily. To lower blood pressure 04/27/19   McClung, Angela M, PA-C  losartan (COZAAR) 100 MG tablet Take 1 tablet (100 mg total) by mouth daily. To lower blood pressure 04/27/19   McClung, Angela M, PA-C  meclizine (ANTIVERT) 25 MG tablet Take 1 tablet (25 mg total) by mouth 2 (two) times daily as needed for dizziness. 12/06/18   Charlott Rakes, MD  metFORMIN (GLUCOPHAGE) 1000 MG tablet TAKE 1 TABLET BY MOUTH 2 TIMES DAILY WITH A MEAL. 04/27/19   Argentina Donovan, PA-C  rosuvastatin (CRESTOR) 20 MG tablet Take 1 tablet (20 mg total) by mouth daily. 04/27/19  Freeman Caldron M, PA-C  sulfamethoxazole-trimethoprim (BACTRIM DS) 800-160 MG tablet Take 1 tablet by mouth 2 (two) times daily. 01/12/19   Fulp, Cammie, MD  topiramate (TOPAMAX) 25 MG tablet Take 1 tablet (25 mg total) by mouth daily for 7 days, THEN 1 tablet (25 mg total) 2 (two) times daily for 21 days. 03/31/18 01/12/19  Frann Rider, NP  traMADol (ULTRAM) 50 MG tablet Take 1 tablet (50 mg total) by mouth every 12 (twelve) hours as needed for moderate pain. 03/03/18   Fulp, Cammie, MD  TRUEPLUS LANCETS 28G MISC Use to check blood sugar daily. 12/16/17   Fulp, Cammie, MD       Objective:  EXAM:   Vitals:   04/27/19 1449  BP: (!) 149/83  Pulse: 83  Temp: 99 F (37.2 C)  SpO2: 96%  Weight: 193 lb 12.8 oz (87.9 kg)  Height: 5' (1.524 m)    General appearance : A&OX3. NAD. Non-toxic-appearing HEENT: Atraumatic and Normocephalic.  PERRLA. EOM intact.  TNeck: supple, no JVD. No cervical lymphadenopathy. No thyromegaly Chest/Lungs:  Breathing-non-labored, Good air entry bilaterally, breath sounds normal without rales, rhonchi, or wheezing  CVS: S1 S2 regular, no murmurs, gallops, rubs  Extremities: Bilateral Lower Ext shows no edema, both legs are warm to touch with = pulse throughout Neurology:  CN II-XII grossly intact, Non focal.   Psych:  TP linear. J/I (unsure if poor or if language barrier plays a role). Normal speech. Appropriate eye contact and affect.  Skin:  No Rash  Data Review Lab Results  Component Value Date   HGBA1C 10.4 (A) 04/27/2019   HGBA1C 8.4 (A) 12/06/2018   HGBA1C 8.9 (H) 02/01/2018     Assessment & Plan   1. Type 2 diabetes mellitus with other circulatory complication, with long-term current use of insulin (HCC) Uncontrolled;  Out of meds.  Check blood sugar tid and record and see Luke in 3 weeks to assess control.  Resume regimen - Glucose (CBG) - HgB A1c - Ambulatory referral to Ophthalmology - metFORMIN (GLUCOPHAGE) 1000 MG tablet; TAKE 1 TABLET BY MOUTH 2 TIMES DAILY WITH A MEAL.  Dispense: 60 tablet; Refill: 3 - rosuvastatin (CRESTOR) 20 MG tablet; Take 1 tablet (20 mg total) by mouth daily.  Dispense: 30 tablet; Refill: 2 - Insulin Syringe-Needle U-100 (TRUEPLUS INSULIN SYRINGE) 31G X 5/16" 0.3 ML MISC; Use to inject insulin daily.  Dispense: 100 each; Refill: 11 - insulin glargine (LANTUS) 100 UNIT/ML injection; Inject 0.2 mLs (20 Units total) into the skin at bedtime.  Dispense: 10 mL; Refill: 5 - glipiZIDE (GLUCOTROL XL) 5 MG 24 hr tablet; Take 1 tablet (5 mg total) by mouth daily with breakfast. To lower blood  sugar  Dispense: 30 tablet; Refill: 6  2. Type 2 diabetes mellitus with hyperglycemia, without long-term current use of insulin (HCC) - losartan (COZAAR) 100 MG tablet; Take 1 tablet (100 mg total) by mouth daily. To lower blood pressure  Dispense: 90 tablet; Refill: 3 - glipiZIDE (GLUCOTROL XL) 5 MG 24 hr tablet; Take 1 tablet (5 mg total) by mouth daily with breakfast. To lower blood sugar  Dispense: 30 tablet; Refill: 6 - Lipid panel - Hemoglobin A1c - Comprehensive metabolic panel  3. Essential hypertension Uncontrolled/out of meds - losartan (COZAAR) 100 MG tablet; Take 1 tablet (100 mg total) by mouth daily. To lower blood pressure  Dispense: 90 tablet; Refill: 3 - labetalol (NORMODYNE) 200 MG tablet; Take 1 tablet (200 mg total) by mouth 2 (two) times daily.  To lower blood pressure  Dispense: 60 tablet; Refill: 11 - amLODipine (NORVASC) 10 MG tablet; Take 1 tablet (10 mg total) by mouth daily. To lower blood pressure  Dispense: 30 tablet; Refill: 11 - Lipid panel  4. Cerebellar stroke (HCC) h/o - losartan (COZAAR) 100 MG tablet; Take 1 tablet (100 mg total) by mouth daily. To lower blood pressure  Dispense: 90 tablet; Refill: 3 - rosuvastatin (CRESTOR) 20 MG tablet; Take 1 tablet (20 mg total) by mouth daily.  Dispense: 30 tablet; Refill: 2 - Lipid panel  [employer] interpreters used and additional time performing visit was required.   Patient have been counseled extensively about nutrition and exercise  Return in about 3 months (around 07/28/2019) for 3 weeks with Lurena Joiner after resuming meds and 3 months with PCP.  The patient was given clear instructions to go to ER or return to medical center if symptoms don't improve, worsen or new problems develop. The patient verbalized understanding. The patient was told to call to get lab results if they haven't heard anything in the next week.     Freeman Caldron, PA-C [facility] and [place],  [place]   04/27/2019, 4:49 PMPatient ID: April Fry, female   DOB: [birth year], 59 y.o.   MRN: [id number]

## 2019-04-27 NOTE — Patient Instructions (Signed)
Check blood sugar 3 times daily and record and bring to next visit 

## 2019-04-28 LAB — COMPREHENSIVE METABOLIC PANEL WITH GFR
ALT: 20 IU/L (ref 0–32)
AST: 17 IU/L (ref 0–40)
Albumin/Globulin Ratio: 1.4 (ref 1.2–2.2)
Albumin: 4.3 g/dL (ref 3.8–4.9)
Alkaline Phosphatase: 90 IU/L (ref 39–117)
BUN/Creatinine Ratio: 16 (ref 9–23)
BUN: 8 mg/dL (ref 6–24)
Bilirubin Total: 0.4 mg/dL (ref 0.0–1.2)
CO2: 25 mmol/L (ref 20–29)
Calcium: 9.3 mg/dL (ref 8.7–10.2)
Chloride: 99 mmol/L (ref 96–106)
Creatinine, Ser: 0.51 mg/dL — ABNORMAL LOW (ref 0.57–1.00)
GFR calc Af Amer: 122 mL/min/1.73
GFR calc non Af Amer: 106 mL/min/1.73
Globulin, Total: 3 g/dL (ref 1.5–4.5)
Glucose: 208 mg/dL — ABNORMAL HIGH (ref 65–99)
Potassium: 4.7 mmol/L (ref 3.5–5.2)
Sodium: 139 mmol/L (ref 134–144)
Total Protein: 7.3 g/dL (ref 6.0–8.5)

## 2019-04-28 LAB — LIPID PANEL
Chol/HDL Ratio: 4 ratio (ref 0.0–4.4)
Cholesterol, Total: 190 mg/dL (ref 100–199)
HDL: 47 mg/dL
LDL Chol Calc (NIH): 102 mg/dL — ABNORMAL HIGH (ref 0–99)
Triglycerides: 239 mg/dL — ABNORMAL HIGH (ref 0–149)
VLDL Cholesterol Cal: 41 mg/dL — ABNORMAL HIGH (ref 5–40)

## 2019-05-07 LAB — CUP PACEART REMOTE DEVICE CHECK
Date Time Interrogation Session: 20210328023750
Implantable Pulse Generator Implant Date: 20200302

## 2019-05-08 ENCOUNTER — Ambulatory Visit (INDEPENDENT_AMBULATORY_CARE_PROVIDER_SITE_OTHER): Payer: Self-pay | Admitting: *Deleted

## 2019-05-08 DIAGNOSIS — I639 Cerebral infarction, unspecified: Secondary | ICD-10-CM

## 2019-05-08 NOTE — Progress Notes (Signed)
ILR Remote 

## 2019-05-09 ENCOUNTER — Encounter: Payer: Self-pay | Admitting: *Deleted

## 2019-05-29 MED FILL — glipiZIDE XL 5 MG TB24: 5 | 30 days supply | Qty: 30 | Fill #1

## 2019-05-29 MED FILL — metFORMIN HCL 1000 MG TABS: 1000 | 30 days supply | Qty: 60 | Fill #0

## 2019-05-29 MED FILL — LABETALOL HCL 200 MG TABS: 200 | 30 days supply | Qty: 60 | Fill #1

## 2019-06-08 ENCOUNTER — Ambulatory Visit (INDEPENDENT_AMBULATORY_CARE_PROVIDER_SITE_OTHER): Payer: Self-pay | Admitting: *Deleted

## 2019-06-08 DIAGNOSIS — I639 Cerebral infarction, unspecified: Secondary | ICD-10-CM

## 2019-06-08 LAB — CUP PACEART REMOTE DEVICE CHECK
Date Time Interrogation Session: 20210429001115
Implantable Pulse Generator Implant Date: 20200302

## 2019-06-08 NOTE — Progress Notes (Signed)
ILR Remote 

## 2019-07-11 ENCOUNTER — Ambulatory Visit (INDEPENDENT_AMBULATORY_CARE_PROVIDER_SITE_OTHER): Payer: Self-pay | Admitting: *Deleted

## 2019-07-11 DIAGNOSIS — I639 Cerebral infarction, unspecified: Secondary | ICD-10-CM

## 2019-07-11 LAB — CUP PACEART REMOTE DEVICE CHECK
Date Time Interrogation Session: 20210531234310
Implantable Pulse Generator Implant Date: 20200302

## 2019-07-11 NOTE — Progress Notes (Signed)
Carelink Summary Report / Loop Recorder 

## 2019-07-13 ENCOUNTER — Ambulatory Visit: Payer: Self-pay | Admitting: Family Medicine

## 2019-07-13 MED FILL — LOSARTAN POTASSIUM 100 MG T: 100 | 30 days supply | Qty: 30 | Fill #1

## 2019-07-13 MED FILL — metFORMIN HCL 1000 MG TABS: 1000 | 30 days supply | Qty: 60 | Fill #1

## 2019-07-13 MED FILL — LABETALOL HCL 200 MG TABLET: 200 | 30 days supply | Qty: 60 | Fill #2

## 2019-07-13 MED FILL — $LANTUS 100 UNITS/ML VIAL: 100 | 28 days supply | Qty: 10 | Fill #2

## 2019-07-13 MED FILL — glipiZIDE XL 5 MG TB24: 5 | 30 days supply | Qty: 30 | Fill #2

## 2019-08-10 ENCOUNTER — Ambulatory Visit (INDEPENDENT_AMBULATORY_CARE_PROVIDER_SITE_OTHER): Payer: Self-pay | Admitting: *Deleted

## 2019-08-10 DIAGNOSIS — I639 Cerebral infarction, unspecified: Secondary | ICD-10-CM

## 2019-08-13 LAB — CUP PACEART REMOTE DEVICE CHECK
Date Time Interrogation Session: 20210703234441
Implantable Pulse Generator Implant Date: 20200302

## 2019-08-15 NOTE — Progress Notes (Signed)
Carelink Summary Report / Loop Recorder 

## 2019-09-11 ENCOUNTER — Ambulatory Visit (INDEPENDENT_AMBULATORY_CARE_PROVIDER_SITE_OTHER): Payer: Self-pay | Admitting: *Deleted

## 2019-09-11 DIAGNOSIS — I639 Cerebral infarction, unspecified: Secondary | ICD-10-CM

## 2019-09-11 MED FILL — LABETALOL HCL 200 MG TABLET: 200 | 30 days supply | Qty: 60 | Fill #3

## 2019-09-11 MED FILL — glipiZIDE XL 5 MG TB24: 5 | 30 days supply | Qty: 30 | Fill #3

## 2019-09-11 MED FILL — LOSARTAN POTASSIUM 100 MG T: 100 | 30 days supply | Qty: 30 | Fill #2

## 2019-09-11 MED FILL — $LANTUS 100 UNITS/ML VIAL: 100 | 28 days supply | Qty: 10 | Fill #3

## 2019-09-11 MED FILL — metFORMIN HCL 1000 MG TABS: 1000 | 30 days supply | Qty: 60 | Fill #2

## 2019-09-15 LAB — CUP PACEART REMOTE DEVICE CHECK
Date Time Interrogation Session: 20210805234700
Implantable Pulse Generator Implant Date: 20200302

## 2019-09-18 NOTE — Progress Notes (Signed)
Carelink Summary Report / Loop Recorder 

## 2019-10-18 ENCOUNTER — Ambulatory Visit (INDEPENDENT_AMBULATORY_CARE_PROVIDER_SITE_OTHER): Payer: Self-pay | Admitting: *Deleted

## 2019-10-18 DIAGNOSIS — I639 Cerebral infarction, unspecified: Secondary | ICD-10-CM

## 2019-10-18 LAB — CUP PACEART REMOTE DEVICE CHECK
Date Time Interrogation Session: 20210907235217
Implantable Pulse Generator Implant Date: 20200302

## 2019-10-19 NOTE — Progress Notes (Signed)
Carelink Summary Report / Loop Recorder 

## 2019-10-23 MED FILL — glipiZIDE XL 5 MG TB24: 5 | 30 days supply | Qty: 30 | Fill #4

## 2019-10-23 MED FILL — metFORMIN HCL 1000 MG TABS: 1000 | 30 days supply | Qty: 60 | Fill #3

## 2019-10-23 MED FILL — $LANTUS 100 UNITS/ML VIAL: 100 | 28 days supply | Qty: 10 | Fill #4

## 2019-10-23 MED FILL — AMLODIPINE BESYLATE 10 MG T: 10 | 30 days supply | Qty: 30 | Fill #1

## 2019-10-23 MED FILL — TRUEPLUS SYR 1ML 31GX5/16: 31G X 5/16" | 90 days supply | Qty: 100 | Fill #1

## 2019-11-20 ENCOUNTER — Ambulatory Visit (INDEPENDENT_AMBULATORY_CARE_PROVIDER_SITE_OTHER): Payer: Self-pay

## 2019-11-20 DIAGNOSIS — I639 Cerebral infarction, unspecified: Secondary | ICD-10-CM

## 2019-11-20 LAB — CUP PACEART REMOTE DEVICE CHECK
Date Time Interrogation Session: 20211011000906
Implantable Pulse Generator Implant Date: 20200302

## 2019-11-21 NOTE — Progress Notes (Signed)
Carelink Summary Report / Loop Recorder 

## 2019-12-15 ENCOUNTER — Other Ambulatory Visit: Payer: Self-pay | Admitting: Physician Assistant

## 2019-12-15 DIAGNOSIS — Z794 Long term (current) use of insulin: Secondary | ICD-10-CM

## 2019-12-15 DIAGNOSIS — E1159 Type 2 diabetes mellitus with other circulatory complications: Secondary | ICD-10-CM

## 2019-12-15 MED FILL — $LANTUS 100 UNITS/ML VIAL: 100 | 28 days supply | Qty: 10 | Fill #5

## 2019-12-15 MED FILL — METFORMIN HCL 1000 MG TABS: 1000 | 30 days supply | Qty: 60 | Fill #1

## 2019-12-15 MED FILL — glipiZIDE XL 5 MG TB24: 5 | 30 days supply | Qty: 30 | Fill #5

## 2019-12-15 MED FILL — LABETALOL HCL 200 MG TABS: 200 | 30 days supply | Qty: 60 | Fill #4

## 2019-12-24 LAB — CUP PACEART REMOTE DEVICE CHECK
Date Time Interrogation Session: 20211112231127
Implantable Pulse Generator Implant Date: 20200302

## 2019-12-25 ENCOUNTER — Ambulatory Visit (INDEPENDENT_AMBULATORY_CARE_PROVIDER_SITE_OTHER): Payer: Self-pay

## 2019-12-25 DIAGNOSIS — I639 Cerebral infarction, unspecified: Secondary | ICD-10-CM

## 2019-12-26 NOTE — Progress Notes (Signed)
Carelink Summary Report / Loop Recorder 

## 2020-01-25 LAB — CUP PACEART REMOTE DEVICE CHECK
Date Time Interrogation Session: 20211216000250
Implantable Pulse Generator Implant Date: 20200302

## 2020-01-29 ENCOUNTER — Ambulatory Visit (INDEPENDENT_AMBULATORY_CARE_PROVIDER_SITE_OTHER): Payer: Self-pay

## 2020-01-29 DIAGNOSIS — I639 Cerebral infarction, unspecified: Secondary | ICD-10-CM

## 2020-02-06 MED FILL — LABETALOL HCL 200 MG TABS: 200 | 30 days supply | Qty: 60 | Fill #5

## 2020-02-06 MED FILL — TRUEPLUS SYR 1ML 31GX5/16: 31G X 5/16" | 90 days supply | Qty: 100 | Fill #2

## 2020-02-06 MED FILL — !LANTUS 100 UNITS/ML VIAL: 100 | 50 days supply | Qty: 10 | Fill #0

## 2020-02-08 NOTE — Progress Notes (Signed)
Carelink Summary Report / Loop Recorder 

## 2020-02-29 LAB — CUP PACEART REMOTE DEVICE CHECK
Date Time Interrogation Session: 20220118002132
Implantable Pulse Generator Implant Date: 20200302

## 2020-03-04 ENCOUNTER — Ambulatory Visit (INDEPENDENT_AMBULATORY_CARE_PROVIDER_SITE_OTHER): Payer: Self-pay

## 2020-03-04 DIAGNOSIS — I639 Cerebral infarction, unspecified: Secondary | ICD-10-CM

## 2020-03-14 NOTE — Progress Notes (Signed)
Carelink Summary Report / Loop Recorder 

## 2020-04-04 LAB — CUP PACEART REMOTE DEVICE CHECK
Date Time Interrogation Session: 20220220002036
Implantable Pulse Generator Implant Date: 20200302

## 2020-04-08 ENCOUNTER — Ambulatory Visit (INDEPENDENT_AMBULATORY_CARE_PROVIDER_SITE_OTHER): Payer: Self-pay

## 2020-04-08 ENCOUNTER — Other Ambulatory Visit: Payer: Self-pay | Admitting: Family Medicine

## 2020-04-08 DIAGNOSIS — Z794 Long term (current) use of insulin: Secondary | ICD-10-CM

## 2020-04-08 DIAGNOSIS — E1159 Type 2 diabetes mellitus with other circulatory complications: Secondary | ICD-10-CM

## 2020-04-08 DIAGNOSIS — I639 Cerebral infarction, unspecified: Secondary | ICD-10-CM

## 2020-04-08 MED FILL — glipiZIDE XL 5 MG TB24: 5 | 30 days supply | Qty: 30 | Fill #6

## 2020-04-08 NOTE — Telephone Encounter (Signed)
Using [facility] Onalee Hua [phone] for Spanish.    I called her home number however it is out of service.  I was calling regarding the metformin refill request.   I'm not sure she is still taking this as the last refill was 04/27/2019 #60 with 3 refills meaning she has been off of this since June 2021.    I have refused the medication for refill and sent this information to [facility].

## 2020-04-15 ENCOUNTER — Emergency Department (HOSPITAL_COMMUNITY)
Admission: EM | Admit: 2020-04-15 | Discharge: 2020-04-15 | Disposition: A | Discharge status: Home / Self Care | Payer: Self-pay | Attending: Emergency Medicine | Admitting: Emergency Medicine

## 2020-04-15 ENCOUNTER — Other Ambulatory Visit: Payer: Self-pay

## 2020-04-15 ENCOUNTER — Emergency Department (HOSPITAL_COMMUNITY): Payer: Self-pay

## 2020-04-15 DIAGNOSIS — Z7982 Long term (current) use of aspirin: Secondary | ICD-10-CM | POA: Insufficient documentation

## 2020-04-15 DIAGNOSIS — R0781 Pleurodynia: Secondary | ICD-10-CM | POA: Insufficient documentation

## 2020-04-15 DIAGNOSIS — E119 Type 2 diabetes mellitus without complications: Secondary | ICD-10-CM | POA: Insufficient documentation

## 2020-04-15 DIAGNOSIS — Z7984 Long term (current) use of oral hypoglycemic drugs: Secondary | ICD-10-CM | POA: Insufficient documentation

## 2020-04-15 DIAGNOSIS — R079 Chest pain, unspecified: Secondary | ICD-10-CM

## 2020-04-15 DIAGNOSIS — Z79899 Other long term (current) drug therapy: Secondary | ICD-10-CM | POA: Insufficient documentation

## 2020-04-15 DIAGNOSIS — Z794 Long term (current) use of insulin: Secondary | ICD-10-CM | POA: Insufficient documentation

## 2020-04-15 DIAGNOSIS — I10 Essential (primary) hypertension: Secondary | ICD-10-CM | POA: Insufficient documentation

## 2020-04-15 LAB — CBC
HCT: 39.6 % (ref 36.0–46.0)
Hemoglobin: 13 g/dL (ref 12.0–15.0)
MCH: 27.9 pg (ref 26.0–34.0)
MCHC: 32.8 g/dL (ref 30.0–36.0)
MCV: 85 fL (ref 80.0–100.0)
Platelets: 313 10*3/uL (ref 150–400)
RBC: 4.66 MIL/uL (ref 3.87–5.11)
RDW: 13.8 % (ref 11.5–15.5)
WBC: 8.8 10*3/uL (ref 4.0–10.5)
nRBC: 0 % (ref 0.0–0.2)

## 2020-04-15 LAB — BASIC METABOLIC PANEL
Anion gap: 12 (ref 5–15)
BUN: 13 mg/dL (ref 6–20)
CO2: 21 mmol/L — ABNORMAL LOW (ref 22–32)
Calcium: 9 mg/dL (ref 8.9–10.3)
Chloride: 107 mmol/L (ref 98–111)
Creatinine, Ser: 0.46 mg/dL (ref 0.44–1.00)
GFR, Estimated: >60 mL/min (ref >60)
Glucose, Bld: 106 mg/dL — ABNORMAL HIGH (ref 70–99)
Potassium: 4 mmol/L (ref 3.5–5.1)
Sodium: 140 mmol/L (ref 135–145)

## 2020-04-15 LAB — TROPONIN I (HIGH SENSITIVITY)
Troponin I (High Sensitivity): 5 ng/L (ref <18)
Troponin I (High Sensitivity): 6 ng/L (ref <18)

## 2020-04-15 LAB — D-DIMER, QUANTITATIVE: D-Dimer, Quant: <0.27 ug/mL-FEU (ref 0.00–0.50)

## 2020-04-15 NOTE — ED Triage Notes (Signed)
Pt from UC for further eval of chest pain x 1 week. EKG at [facility] showed LBBB, unknown hx of same.

## 2020-04-15 NOTE — ED Provider Notes (Signed)
[facility] EMERGENCY DEPARTMENT Provider Note   CSN: [id number] Arrival date & time: 04/15/20  1257     History Chief Complaint  Patient presents with  . Chest Pain    April Fry is a 60 y.o. female.  She has a history of diabetes, hypertension, and hyperlipidemia.  Patient presents from her outpatient PCPs office for concerns about chest pain that is been going on for about a week.  Patient reports that she has chest pain in the anterior left side of her chest.  It is pleuritic in nature and sharp.  It gets worse when she takes deep breaths.  Patient does not report that her pain changes with exertion or with eating.  It does not radiate anywhere.  It does come and go.  Patient has not taken anything that has improved the pain.  Other than deep breaths, she has not noticed anything that makes it worse.  Patient states that she was in the primary care office when they told her that she had something wrong with her heart and she needed to come to the ED for evaluation.  She is not sure what they found.  Patient denies any recent visual changes or headaches.  Patient denies any shortness of breath.  Patient denies any recent leg swelling or leg pain.  She has no history of blood clots.  She reports a history of hypertension, however she reports that she did not take her medication today.    Past Medical History:  Diagnosis Date  . Diabetes mellitus without complication ([place])   . Hypertension   . Pneumonia 05/21/2016    Patient Active Problem List   Diagnosis Date Noted  . Cryptogenic stroke ([place]) 04/11/2018  . Hyperglycemia   . Hypokalemia   . Hyperlipidemia LDL goal <70   . Class 1 obesity due to excess calories with serious comorbidity and body mass index (BMI) of 33.0 to 33.9 in adult   . Cerebellar stroke ([place]) 01/31/2018  . Hyperlipidemia associated with type 2 diabetes mellitus ([place]) 06/17/2016  . Diabetes mellitus ([place]) 05/21/2016  . HTN  (hypertension) 05/21/2016  . LBBB (left bundle branch block) 05/21/2016    Past Surgical History:  Procedure Laterality Date  . ABDOMINAL HYSTERECTOMY    . LOOP RECORDER INSERTION N/A 04/11/2018   Procedure: LOOP RECORDER INSERTION;  Surgeon: Evans Lance, MD;  Location: [facility] CV LAB;  Service: Cardiovascular;  Laterality: N/A;  . TEE WITHOUT CARDIOVERSION N/A 02/08/2018   Procedure: TRANSESOPHAGEAL ECHOCARDIOGRAM (TEE);  Surgeon: Jerline Pain, MD;  Location: [facility] ENDOSCOPY;  Service: Cardiovascular;  Laterality: N/A;     OB History   No obstetric history on file.     Family History  Problem Relation Age of Onset  . Other Mother        parents passed away in an [place] in 52's  . Heart attack Sister   . Obesity Sister     Social History   Tobacco Use  . Smoking status: Never Smoker  . Smokeless tobacco: Never Used  Vaping Use  . Vaping Use: Never used  Substance Use Topics  . Alcohol use: Yes    Comment: on rare occasions.   . Drug use: No    Home Medications Prior to Admission medications   Medication Sig Start Date End Date Taking? Authorizing Provider  amLODipine (NORVASC) 10 MG tablet Take 1 tablet (10 mg total) by mouth daily. To lower blood pressure 04/27/19   Argentina Donovan,  PA-C  aspirin 325 MG EC tablet Take 1 tablet (325 mg total) by mouth daily. 04/27/19   Argentina Donovan, PA-C  blood glucose meter kit and supplies KIT Dispense based on patient and insurance preference. Use up to four times daily as directed. (FOR ICD-9 250.00, 250.01). 05/23/16   Velvet Bathe, MD  Blood Glucose Monitoring Suppl (TRUE METRIX METER) w/Device KIT Use to check blood sugar in the morning. 12/16/17   Fulp, Cammie, MD  glipiZIDE (GLUCOTROL XL) 5 MG 24 hr tablet Take 1 tablet (5 mg total) by mouth daily with breakfast. To lower blood sugar 04/27/19   Argentina Donovan, PA-C  glucose blood (TRUE METRIX BLOOD GLUCOSE TEST) test strip Use as instructed 04/27/19   Argentina Donovan, PA-C  insulin glargine (LANTUS) 100 UNIT/ML injection Inject 0.2 mLs (20 Units total) into the skin at bedtime. 04/27/19   Argentina Donovan, PA-C  Insulin Syringe-Needle U-100 (TRUEPLUS INSULIN SYRINGE) 31G X 5/16" 0.3 ML MISC Use to inject insulin daily. 04/27/19   Argentina Donovan, PA-C  labetalol (NORMODYNE) 200 MG tablet Take 1 tablet (200 mg total) by mouth 2 (two) times daily. To lower blood pressure 04/27/19   McClung, Angela M, PA-C  losartan (COZAAR) 100 MG tablet Take 1 tablet (100 mg total) by mouth daily. To lower blood pressure 04/27/19   McClung, Angela M, PA-C  meclizine (ANTIVERT) 25 MG tablet Take 1 tablet (25 mg total) by mouth 2 (two) times daily as needed for dizziness. 12/06/18   Charlott Rakes, MD  metFORMIN (GLUCOPHAGE) 1000 MG tablet TAKE 1 TABLET BY MOUTH 2 TIMES DAILY WITH A MEAL. 04/27/19   Argentina Donovan, PA-C  rosuvastatin (CRESTOR) 20 MG tablet Take 1 tablet (20 mg total) by mouth daily. 04/27/19   Argentina Donovan, PA-C  sulfamethoxazole-trimethoprim (BACTRIM DS) 800-160 MG tablet Take 1 tablet by mouth 2 (two) times daily. 01/12/19   Fulp, Cammie, MD  topiramate (TOPAMAX) 25 MG tablet Take 1 tablet (25 mg total) by mouth daily for 7 days, THEN 1 tablet (25 mg total) 2 (two) times daily for 21 days. 03/31/18 01/12/19  Frann Rider, NP  traMADol (ULTRAM) 50 MG tablet Take 1 tablet (50 mg total) by mouth every 12 (twelve) hours as needed for moderate pain. 03/03/18   Fulp, Cammie, MD  TRUEPLUS LANCETS 28G MISC Use to check blood sugar daily. 12/16/17   Antony Blackbird, MD    Allergies    Patient has no known allergies.  Review of Systems   Review of Systems  Constitutional: Negative for chills and fever.  HENT: Negative for ear pain and sore throat.   Eyes: Negative for pain and visual disturbance.  Respiratory: Positive for chest tightness. Negative for cough and shortness of breath.   Cardiovascular: Positive for chest pain. Negative for palpitations.   Gastrointestinal: Negative for abdominal pain and vomiting.  Genitourinary: Negative for dysuria and hematuria.  Musculoskeletal: Negative for arthralgias and back pain.  Skin: Negative for color change and rash.  Neurological: Negative for seizures and syncope.  All other systems reviewed and are negative.   Physical Exam Updated Vital Signs BP (!) 184/65   Pulse 61   Temp 98.5 F (36.9 C)   Resp 18   SpO2 99%   Physical Exam Vitals and nursing note reviewed.  Constitutional:      General: She is not in acute distress.    Appearance: She is well-nourished. She is obese.  HENT:  Head: Normocephalic and atraumatic.  Eyes:     Conjunctiva/sclera: Conjunctivae normal.  Cardiovascular:     Rate and Rhythm: Normal rate and regular rhythm.     Pulses:          Radial pulses are 2+ on the right side and 2+ on the left side.       Dorsalis pedis pulses are 2+ on the right side and 2+ on the left side.     Heart sounds: No murmur heard.   Pulmonary:     Effort: Pulmonary effort is normal. No respiratory distress.     Breath sounds: Normal breath sounds. No wheezing or rhonchi.  Abdominal:     Palpations: Abdomen is soft.     Tenderness: There is no abdominal tenderness.  Musculoskeletal:        General: No edema.     Cervical back: Neck supple.     Right lower leg: No edema.     Left lower leg: No edema.  Skin:    General: Skin is warm and dry.  Neurological:     Mental Status: She is alert.  Psychiatric:        Mood and Affect: Mood and affect normal.     ED Results / Procedures / Treatments   Labs (all labs ordered are listed, but only abnormal results are displayed) Labs Reviewed  BASIC METABOLIC PANEL - Abnormal; Notable for the following components:      Result Value   CO2 21 (*)    Glucose, Bld 106 (*)    All other components within normal limits  CBC  D-DIMER, QUANTITATIVE  TROPONIN I (HIGH SENSITIVITY)  TROPONIN I (HIGH SENSITIVITY)    EKG EKG  Interpretation  Date/Time:  Monday April 15 2020 13:01:47 EST Ventricular Rate:  64 PR Interval:  124 QRS Duration: 138 QT Interval:  436 QTC Calculation: 449 R Axis:   60 Text Interpretation: Normal sinus rhythm Left bundle branch block Abnormal ECG rate is slower compared to 2019 Confirmed by Sherwood Gambler [phone]) on 04/15/2020 4:17:23 PM   Radiology DG Chest 2 View  Result Date: 04/15/2020 CLINICAL DATA:  Chest pain EXAM: CHEST - 2 VIEW COMPARISON:  02/01/2018 FINDINGS: Similar appearance of chronic subsegmental atelectasis or linear scarring in each mid lung. Heart size upper normal. Opacity at the cardiac apex is stable since 06/03/2016, likely a fat pad. The visualized bony structures of the thorax show no acute abnormality. IMPRESSION: No active cardiopulmonary disease. Electronically Signed   By: Misty Stanley M.D.   On: 04/15/2020 13:15    Procedures Procedures   Medications Ordered in ED Medications - No data to display  ED Course  I have reviewed the triage vital signs and the nursing notes.  Pertinent labs & imaging results that were available during my care of the patient were reviewed by me and considered in my medical decision making (see chart for details).  Clinical Course as of 04/16/20 1427  Mon Apr 15, 2020  1809 D-dimer, quantitative [JK]  1809 D-Dimer, Quant: <0.27 [JK]  1809 Troponin I (High Sensitivity): 5 [JK]    Clinical Course User Index [JK] Kugler, [place], MD   MDM Rules/Calculators/A&P                          Patient without new ischemic changes noted on her EKG.  Left bundle branch pattern is documented previously and is not new for the patient.  2 high-sensitivity troponins  drawn each within normal ranges without significant change over the course of 2 hours.  Less likely ACS as causing patient's chest pain at this time.  Considered PE and therefore D-dimer was ordered.  Her D-dimer was within normal limits and therefore CT PE study not  ordered.  Less likely pulmonary embolism as a cause of patient's pain.  Patient's presentation and work-up not consistent with pericarditis or myocarditis at this time.  Patient without recent history of viral infection.  No EKG findings concerning for pericarditis.  Negative troponin.  Chest x-ray without signs of pneumothorax, rib fractures, mediastinal widening.  Overall, patient's work-up unremarkable for concerning causes of acute chest pain today.  Patient does have multiple risk factors that make her high risk for coronary artery disease.  We will have her follow-up with a cardiologist in the outpatient setting for further stratification.  Currently, patient is hemodynamically stable.  She reports control of her chest pain.  I discussed the results above with her.  Her blood pressure continues to be elevated, however she reports not taking her medicine this morning.  I encouraged her to take her medicine and continue her outpatient regimen with close follow-up with her primary doctor and a cardiologist.  Stable for discharge home at this time.   Final Clinical Impression(s) / ED Diagnoses Final diagnoses:  Chest pain, unspecified type    Rx / DC Orders ED Discharge Orders    None       Kugler, [place], MD 04/16/20 1433    Sherwood Gambler, MD 04/18/20 1018

## 2020-04-15 NOTE — Discharge Instructions (Signed)
-   You are not having a heart attack today - It is important to follow up with a cardiologist for outpatient follow up - Please call the number above to make an appointment - Please take all of your blood pressure medication as prescribed, as your blood pressure was elevated today

## 2020-04-15 NOTE — Progress Notes (Signed)
Carelink Summary Report / Loop Recorder 

## 2020-04-16 ENCOUNTER — Ambulatory Visit: Payer: Self-pay | Admitting: Physician Assistant

## 2020-04-16 ENCOUNTER — Telehealth: Payer: Self-pay

## 2020-04-16 ENCOUNTER — Other Ambulatory Visit: Payer: Self-pay | Admitting: Physician Assistant

## 2020-04-16 VITALS — BP 174/90 | HR 75 | Temp 98.6°F | Resp 18 | Ht 59.0 in | Wt 188.0 lb

## 2020-04-16 DIAGNOSIS — Z794 Long term (current) use of insulin: Secondary | ICD-10-CM

## 2020-04-16 DIAGNOSIS — E1159 Type 2 diabetes mellitus with other circulatory complications: Secondary | ICD-10-CM

## 2020-04-16 DIAGNOSIS — I10 Essential (primary) hypertension: Secondary | ICD-10-CM

## 2020-04-16 DIAGNOSIS — I639 Cerebral infarction, unspecified: Secondary | ICD-10-CM

## 2020-04-16 DIAGNOSIS — R739 Hyperglycemia, unspecified: Secondary | ICD-10-CM

## 2020-04-16 LAB — POCT GLYCOSYLATED HEMOGLOBIN (HGB A1C): Hemoglobin A1C: 9.4 % — AB (ref 4.0–5.6)

## 2020-04-16 MED ORDER — "INSULIN SYRINGE-NEEDLE U-100 31G X 5/16"" 0.3 ML MISC": 11 refills | Status: DC

## 2020-04-16 MED ORDER — AMLODIPINE BESYLATE 10 MG PO TABS: 10.0000 mg | ORAL_TABLET | Freq: Every day | ORAL | 0 refills | Status: DC

## 2020-04-16 MED ORDER — METFORMIN HCL 1000 MG PO TABS: ORAL_TABLET | ORAL | 0 refills | Status: DC

## 2020-04-16 MED ORDER — ROSUVASTATIN CALCIUM 20 MG PO TABS
20.0000 mg | ORAL_TABLET | Freq: Every day | ORAL | 0 refills | Status: DC
Start: 2020-04-16 — End: 2020-04-16

## 2020-04-16 MED ORDER — INSULIN GLARGINE 100 UNIT/ML ~~LOC~~ SOLN
20.0000 [IU] | Freq: Every day | SUBCUTANEOUS | 5 refills | Status: DC
  Filled 2020-05-29: qty 10, 28d supply, fill #0
  Filled 2020-07-15: qty 20, 56d supply, fill #1
  Filled 2020-07-15: qty 10, 28d supply, fill #1
  Filled 2020-10-11: qty 20, 56d supply, fill #2
  Filled 2021-01-16: qty 10, 28d supply, fill #3

## 2020-04-16 MED ORDER — LABETALOL HCL 200 MG PO TABS
200.0000 mg | ORAL_TABLET | Freq: Two times a day (BID) | ORAL | 0 refills | Status: DC
Start: 2020-04-16 — End: 2020-04-16

## 2020-04-16 MED ORDER — GLIPIZIDE ER 5 MG PO TB24
5.0000 mg | ORAL_TABLET | Freq: Every day | ORAL | 0 refills | Status: DC
Start: 2020-04-16 — End: 2020-04-16

## 2020-04-16 MED ORDER — LOSARTAN POTASSIUM 100 MG PO TABS
100.0000 mg | ORAL_TABLET | Freq: Every day | ORAL | 2 refills | Status: DC
Start: 2020-04-16 — End: 2020-04-16

## 2020-04-16 MED FILL — AMLODIPINE BESYLATE 10 MG T: 10 | 30 days supply | Qty: 30 | Fill #0

## 2020-04-16 MED FILL — LABETALOL HCL 200 MG TABS: 200 | 30 days supply | Qty: 60 | Fill #0

## 2020-04-16 MED FILL — TRUEPLUS SYR 0.3ML 31GX5/16: 31G X 5/16" | 90 days supply | Qty: 100 | Fill #0

## 2020-04-16 MED FILL — $LANTUS 100 UNITS/ML VIAL: 100 | 28 days supply | Qty: 10 | Fill #0

## 2020-04-16 MED FILL — METFORMIN HCL 1000 MG TABS: 1000 | 30 days supply | Qty: 60 | Fill #0

## 2020-04-16 MED FILL — ROSUVASTATIN CALCIUM 20 MG: 20 | 30 days supply | Qty: 30 | Fill #0

## 2020-04-16 MED FILL — LOSARTAN POTASSIUM 100 MG T: 100 | 30 days supply | Qty: 30 | Fill #0

## 2020-04-16 MED FILL — glipiZIDE XL 5 MG TB24: 5 | 30 days supply | Qty: 30 | Fill #0

## 2020-04-16 NOTE — Telephone Encounter (Signed)
NOTES ON FILE FROM LUISA FERNANDEZ [phone], SENT REFERRAL TO SCHEDULING

## 2020-04-16 NOTE — Patient Instructions (Signed)
Please make sure to keep your follow up appt at [facility].  Roney Jaffe, PA-C Physician Assistant [facility] [email or web address]   Control de la glucemia, en adultos Blood Glucose Monitoring, Adult Controlar el nivel de azcar en la sangre (glucosa) es una parte importante del tratamiento de la diabetes. El control de la glucemia implica realizar controles regulares como se lo hayan indicado, y Midwife un registro o diario de los [place] a lo largo del [place]. Controlar la glucemia en forma peridica y llevar un registro diario puede:  Ayudarlos a usted y al mdico a Theme park manager de control de la diabetes segn sea necesario, lo que incluye medicamentos o insulina.  Ayudarlo a usted a comprender de [employer], la actividad fsica, las enfermedades y los medicamentos inciden en la glucemia.  Permitirle a usted saber cul es su nivel de glucemia en cualquier momento. Averiguar rpidamente si su nivel de glucemia es bajo (hipoglucemia) o alto (hiperglucemia). El [employer] objetivos personalizados de su tratamiento. Los objetivos estarn basados en su edad, en otras afecciones que tenga y en cmo responde al tratamiento de la diabetes. Generalmente, el objetivo del tratamiento es [employer] siguientes niveles de glucemia:  Antes de las comidas (preprandial): de 80 a 130mg /dl (de 4.4 a ).  Despus de las comidas (posprandial): por debajo de 180mg /dl ([id number]).  Nivel de A1c: menos del 7%. Materiales necesarios:  Medidor de glucemia.  Tiras reactivas para el medidor. Cada medidor tiene sus propias tiras reactivas. Debe usar las tiras reactivas que trajo su medidor.  Una aguja para pincharse el dedo (lanceta). No use una lanceta ms de una vez.  Un dispositivo que sujeta la lanceta (dispositivo de puncin).  Un diario o cuaderno de anotaciones para  . Cmo controlar su glucemia Controlarse la glucemia 1. Lvese las manos durante al menos 20segundos con agua y 67ELFY/B. 2. Pnchese el costado del dedo (no en la punta) con la lanceta. No use el mismo dedo de forma consecutiva. 3. Frote suavemente el dedo Education officer, environmental que aparezca una pequea gota de [place]. 4. Siga las instrucciones que vienen con el medidor para [address] tira [place], Public affairs consultant la sangre sobre la tira y usar el medidor de glucemia. 5. Anote su resultado y las observaciones que desee en su registro.   Usar sitios alternativos Algunos medidores le permiten tomar sangre para la prueba de otras zonas del cuerpo que no son el dedo (sitios alternativos). Los sitios alternativos ms frecuentes son Firefighter, el muslo y la palma de la mano. Es posible que los sitios alternativos no sean tan precisos como los dedos porque el flujo de sangre es ms lento en esas zonas. Esto significa que el resultado que obtiene de estas zonas puede estar retrasado y ser un poco diferente del resultado que obtendra de su dedo. Use el dedo solamente, y no utilice sitios alternativos si:  Cree que tiene hipoglucemia.  A veces no sabe que su nivel de glucemia est bajando (hipoglucemia asintomtica). Recomendaciones y consejos generales Registro diario de glucemia  Cada vez que controle su nivel de glucemia, anote el resultado. Tambin anote aquellos factores que puedan estar afectando su nivel de glucemia, como la dieta y la actividad fsica Contractor. Esta informacin puede ayudarlos a usted y al pediatra a: ? Marvis Moeller patrones en su glucemia durante el transcurso del [place]. ? Ajustar su plan de control de la diabetes segn sea necesario.  Averige si su medidor le permite descargar sus registros en una computadora o si hay una aplicacin para Chief Executive Officer. [employer] de los medidores de glucosa guardan un registro de las lecturas realizadas con el medidor.   Si usted  tiene diabetes tipo 1:  Contrlese la glucemia 4 o ms veces por da si recibe tratamiento intensivo con insulina con mltiples inyecciones diarias o si [place] una bomba de insulina. Controle su nivel de glucemia: ? Antes de cada comida y colacin. ? Antes de ir a acostarse.  Tambin controle su nivel de glucemia: ? Si tiene sntomas de hipoglucemia. ? Despus de tratar un nivel bajo de glucemia. ? Antes de realizar [employer] generan un riesgo de lesiones, como conducir o usar [place]. ? Antes y despus de hacer ejercicio. ? Dos horas despus de una comida. ? Ocasionalmente, entre las 2:00a.m. y las 3:00a.m., como se lo hayan indicado.  Es posible que deba controlarse la glucemia con ms frecuencia, de 6 a 10veces por da, si: ? Tiene diabetes que no est bien controlada. ? Est enfermo. ? Tiene antecedentes de hipoglucemia grave. ? Tiene hipoglucemia asintomtica. Si usted tiene diabetes tipo 2:  Contrlese la glucemia 2 o ms veces al da si se aplica insulina u otros medicamentos para la diabetes.  Contrlese la glucemia 4o ms veces al da si recibe tratamiento intensivo con insulina. Ocasionalmente, tambin es posible que deba controlarse la glucosa entre las 2:00a.m. y las 3:00a.m., como se lo hayan indicado.  Tambin controle su nivel de glucemia: ? Antes y despus de hacer ejercicio. ? Antes de realizar [employer] generan un riesgo de lesiones, como conducir o usar [place].  Es posible que deba controlar con ms frecuencia su nivel de glucemia si: ? Necesita ajustar la dosis de sus medicamentos. ? Su diabetes no est bien controlada. ? Est enfermo. Consejos generales  Asegrese de tener siempre a mano sus suministros.  Despus de usar algunas cajas de tiras reactivas, ajuste (calibre) el medidor de glucemia segn las instrucciones del medidor.  Todos los medidores de glucemia incluyen un nmero de telfono directo, disponible las 24 horas, al  que podr llamar si tiene preguntas o [place]. Adems, comunquese con su mdico si tiene alguna pregunta o inquietud. Dnde buscar ms informacin  American Diabetes Association (Asociacin Estadounidense de la Diabetes): www.diabetes.org  The Association of Diabetes Care & Education Specialists (Asociacin de Especialistas en Atencin y Educacin sobre la Diabetes): www.diabeteseducator.org Comunquese con un mdico si:  El nivel de glucemia es mayor o igual que 240mg /dl ( ) durante 2das seguidos.  Ha estado enfermo o ha tenido fiebre durante 2das o ms, y no mejora.  Tiene alguno de los siguientes problemas durante ms de 6horas: ? No puede comer ni beber. ? Tiene nuseas o vmitos. ? Tiene diarrea. Solicite ayuda de inmediato si:  Su nivel de glucemia est por debajo de 54mg /dl (60mmol/l).  Siente confusin o tiene dificultad para pensar con claridad.  Tiene dificultad para respirar.  Tiene un nivel moderado o alto de cetonas en la [place]. Estos sntomas pueden representar un problema grave que constituye 1m. No espere a ver si los sntomas desaparecen. Solicite atencin mdica de inmediato. Comunquese con el servicio de emergencias de su localidad (911 en los [place]). No conduzca por sus propios medios [place]. Resumen  Controlar su glucemia es una parte importante del tratamiento de su diabetes.  El control de la glucemia implica realizar controles regulares como se lo hayan indicado,  y Midwife un registro o diario de los [place] a lo largo del [place].  El [employer] objetivos personalizados de su tratamiento. Los objetivos estarn basados en su edad, en otras afecciones que tenga y en cmo responde al tratamiento de la diabetes.  Cada vez que controle su nivel de glucemia, anote el resultado. Tambin anote aquellos factores que puedan estar afectando su glucemia, como la dieta y la actividad fsica Corporate investment banker. Esta informacin no tiene Theme park manager el consejo del mdico. Asegrese de hacerle al mdico cualquier pregunta que tenga. Document Revised: 11/21/2019 Document Reviewed: 11/21/2019 Elsevier Patient Education  2021 [employer].

## 2020-04-16 NOTE — Progress Notes (Signed)
Patient presents for a refill on medications. Patient has been out of medication for 4 days. Patient denies pain at this time. Patient reports intermittent dizziness over the past 4 days. Patient has eaten today and patient has taken medication today.

## 2020-04-16 NOTE — Progress Notes (Signed)
Established Patient Office Visit  Subjective:  Patient ID: April Fry, female    DOB: Jun 30, 1960  Age: 60 y.o. MRN: [id number]  CC:  Chief Complaint  Patient presents with  . Medication Refill    DM+BP    HPI  Due to language barrier, an interpreter was present during the history-taking and subsequent discussion (and for part of the physical exam) with this patient.  Wynonna Arana-Torres reports that she has been out of her medicatons for the past 4 days ; was last seen at [facility] on 04/27/19  States that she is not checking her BG at home   States that she is checking her BP at home, states that when she is taking her medication, her BP is somewhat controlled, unsure of readings   States that yesterday after she tried to be seen at [facility] and told it would be approximately 4 months before she can be seen, states that she found a provider in [place] to refill her medications.  Reports that they felt that she needed to be seen at the emergency department and sent her there by ambulance.  Reports when she got to the emergency department she was told everything was fine she was not having any cardiovascular events.  Reports that they did give her prescription refills with paper prescriptions, states that she is unable to afford getting them filled.    Hospital note:    Patient without new ischemic changes noted on her EKG.  Left bundle branch pattern is documented previously and is not new for the patient.  2 high-sensitivity troponins drawn each within normal ranges without significant change over the course of 2 hours.  Less likely ACS as causing patient's chest pain at this time.  Considered PE and therefore D-dimer was ordered.  Her D-dimer was within normal limits and therefore CT PE study not ordered.  Less likely pulmonary embolism as a cause of patient's pain.  Patient's presentation and work-up not consistent with pericarditis or myocarditis  at this time.  Patient without recent history of viral infection.  No EKG findings concerning for pericarditis.  Negative troponin.  Chest x-ray without signs of pneumothorax, rib fractures, mediastinal widening.  Overall, patient's work-up unremarkable for concerning causes of acute chest pain today.  Patient does have multiple risk factors that make her high risk for coronary artery disease.  We will have her follow-up with a cardiologist in the outpatient setting for further stratification.  Currently, patient is hemodynamically stable.  She reports control of her chest pain.  I discussed the results above with her.  Her blood pressure continues to be elevated, however she reports not taking her medicine this morning.  I encouraged her to take her medicine and continue her outpatient regimen with close follow-up with her primary doctor and a cardiologist.  Stable for discharge home at this time.      Past Medical History:  Diagnosis Date  . Diabetes mellitus without complication ([place])   . Hypertension   . Pneumonia 05/21/2016    Past Surgical History:  Procedure Laterality Date  . ABDOMINAL HYSTERECTOMY    . LOOP RECORDER INSERTION N/A 04/11/2018   Procedure: LOOP RECORDER INSERTION;  Surgeon: Evans Lance, MD;  Location: [facility] [place] [facility];  Service: Cardiovascular;  Laterality: N/A;  . TEE WITHOUT CARDIOVERSION N/A 02/08/2018   Procedure: TRANSESOPHAGEAL ECHOCARDIOGRAM (TEE);  Surgeon: Jerline Pain, MD;  Location: [facility] ENDOSCOPY;  Service: Cardiovascular;  Laterality: N/A;  Family History  Problem Relation Age of Onset  . Other Mother        parents passed away in an [place] in 6's  . Heart attack Sister   . Obesity Sister     Social History   Socioeconomic History  . Marital status: Single    Spouse name: Not on file  . Number of children: Not on file  . Years of education: Not on file  . Highest education level: Not on file  Occupational History  . Not on  file  Tobacco Use  . Smoking status: Never Smoker  . Smokeless tobacco: Never Used  Vaping Use  . Vaping Use: Never used  Substance and Sexual Activity  . Alcohol use: Yes    Comment: on rare occasions.   . Drug use: No  . Sexual activity: Not Currently  Other Topics Concern  . Not on file  Social History Narrative  . Not on file   Social Determinants of Health   Financial Resource Strain: Not on file  Food Insecurity: Not on file  Transportation Needs: Not on file  Physical Activity: Not on file  Stress: Not on file  Social Connections: Not on file  Intimate Partner Violence: Not on file    Outpatient Medications Prior to Visit  Medication Sig Dispense Refill  . aspirin 325 MG EC tablet Take 1 tablet (325 mg total) by mouth daily. 30 tablet 0  . blood glucose meter kit and supplies KIT Dispense based on patient and insurance preference. Use up to four times daily as directed. (FOR ICD-9 250.00, 250.01). 1 each 0  . Blood Glucose Monitoring Suppl (TRUE METRIX METER) w/Device KIT Use to check blood sugar in the morning. 1 kit 0  . glucose blood (TRUE METRIX BLOOD GLUCOSE TEST) test strip Use as instructed 100 each 12  . meclizine (ANTIVERT) 25 MG tablet Take 1 tablet (25 mg total) by mouth 2 (two) times daily as needed for dizziness. 60 tablet 1  . sulfamethoxazole-trimethoprim (BACTRIM DS) 800-160 MG tablet Take 1 tablet by mouth 2 (two) times daily. 6 tablet 0  . traMADol (ULTRAM) 50 MG tablet Take 1 tablet (50 mg total) by mouth every 12 (twelve) hours as needed for moderate pain. 60 tablet 1  . TRUEPLUS LANCETS 28G MISC Use to check blood sugar daily. 100 each 11  . amLODipine (NORVASC) 10 MG tablet Take 1 tablet (10 mg total) by mouth daily. To lower blood pressure 30 tablet 11  . glipiZIDE (GLUCOTROL XL) 5 MG 24 hr tablet Take 1 tablet (5 mg total) by mouth daily with breakfast. To lower blood sugar 30 tablet 6  . insulin glargine (LANTUS) 100 UNIT/ML injection Inject 0.2  mLs (20 Units total) into the skin at bedtime. 10 mL 5  . Insulin Syringe-Needle U-100 (TRUEPLUS INSULIN SYRINGE) 31G X 5/16" 0.3 ML MISC Use to inject insulin daily. 100 each 11  . labetalol (NORMODYNE) 200 MG tablet Take 1 tablet (200 mg total) by mouth 2 (two) times daily. To lower blood pressure 60 tablet 11  . losartan (COZAAR) 100 MG tablet Take 1 tablet (100 mg total) by mouth daily. To lower blood pressure 90 tablet 3  . metFORMIN (GLUCOPHAGE) 1000 MG tablet TAKE 1 TABLET BY MOUTH 2 TIMES DAILY WITH A MEAL. 60 tablet 3  . rosuvastatin (CRESTOR) 20 MG tablet Take 1 tablet (20 mg total) by mouth daily. 30 tablet 2  . topiramate (TOPAMAX) 25 MG tablet Take 1 tablet (25  mg total) by mouth daily for 7 days, THEN 1 tablet (25 mg total) 2 (two) times daily for 21 days. 49 tablet 0   Facility-Administered Medications Prior to Visit  Medication Dose Route Frequency Provider Last Rate Last Admin  . pneumococcal 13-valent conjugate vaccine (PREVNAR 13) injection 0.5 mL  0.5 mL Intramuscular Once Hairston, Mandesia R, FNP        No Known Allergies  ROS Review of Systems  Constitutional: Negative for chills and fever.  HENT: Negative.   Eyes: Negative.   Respiratory: Negative for shortness of breath.   Cardiovascular: Negative for chest pain and palpitations.  Gastrointestinal: Negative.   Endocrine: Negative.   Musculoskeletal: Negative.   Skin: Negative.   Allergic/Immunologic: Negative.   Neurological: Negative.   Hematological: Negative.   Psychiatric/Behavioral: Negative.       Objective:    Physical Exam Vitals and nursing note reviewed.  Constitutional:      Appearance: Normal appearance.  HENT:     Head: Normocephalic and atraumatic.     Right Ear: External ear normal.     Left Ear: External ear normal.     Nose: Nose normal.     Mouth/Throat:     Mouth: Mucous membranes are moist.     Pharynx: Oropharynx is clear.  Eyes:     Conjunctiva/sclera: Conjunctivae normal.      Pupils: Pupils are equal, round, and reactive to light.  Cardiovascular:     Rate and Rhythm: Normal rate and regular rhythm.     Pulses: Normal pulses.     Heart sounds: Normal heart sounds.  Pulmonary:     Effort: Pulmonary effort is normal.     Breath sounds: Normal breath sounds.  Musculoskeletal:        General: Normal range of motion.     Cervical back: Normal range of motion and neck supple.  Skin:    General: Skin is warm.  Neurological:     General: No focal deficit present.     Mental Status: She is alert and oriented to person, place, and time.  Psychiatric:        Mood and Affect: Mood normal.        Behavior: Behavior normal.        Thought Content: Thought content normal.        Judgment: Judgment normal.     BP (!) 174/90 (BP Location: Left Arm, Patient Position: Sitting, Cuff Size: Large)   Pulse 75   Temp 98.6 F (37 C) (Oral)   Resp 18   Ht _0  (1.499 m)   Wt 188 lb (85.3 kg)   SpO2 95%   BMI 37.97 kg/m  Wt Readings from Last 3 Encounters:  04/16/20 188 lb (85.3 kg)  04/27/19 193 lb 12.8 oz (87.9 kg)  12/06/18 190 lb (86.2 kg)     Health Maintenance Due  Topic Date Due  . Hepatitis C Screening  Never done  . OPHTHALMOLOGY EXAM  Never done  . PAP SMEAR-Modifier  Never done  . COLONOSCOPY (Pts 45-31yr Insurance coverage will need to be confirmed)  Never done  . MAMMOGRAM  Never done  . FOOT EXAM  06/11/2017    There are no preventive care reminders to display for this patient.  No results found for: TSH Lab Results  Component Value Date   WBC 8.8 04/15/2020   HGB 13.0 04/15/2020   HCT 39.6 04/15/2020   MCV 85.0 04/15/2020   PLT 313 04/15/2020  Lab Results  Component Value Date   NA 140 04/15/2020   K 4.0 04/15/2020   CO2 21 (L) 04/15/2020   GLUCOSE 106 (H) 04/15/2020   BUN 13 04/15/2020   CREATININE 0.46 04/15/2020   BILITOT 0.4 04/27/2019   ALKPHOS 90 04/27/2019   AST 17 04/27/2019   ALT 20 04/27/2019   PROT 7.3  04/27/2019   ALBUMIN 4.3 04/27/2019   CALCIUM 9.0 04/15/2020   ANIONGAP 12 04/15/2020   Lab Results  Component Value Date   CHOL 190 04/27/2019   Lab Results  Component Value Date   HDL 47 04/27/2019   Lab Results  Component Value Date   LDLCALC 102 (H) 04/27/2019   Lab Results  Component Value Date   TRIG 239 (H) 04/27/2019   Lab Results  Component Value Date   CHOLHDL 4.0 04/27/2019   Lab Results  Component Value Date   HGBA1C 9.4 (A) 04/16/2020      Assessment & Plan:   Problem List Items Addressed This Visit      Cardiovascular and Mediastinum   Cerebellar stroke (HCC)   Relevant Medications   amLODipine (NORVASC) 10 MG tablet   labetalol (NORMODYNE) 200 MG tablet   losartan (COZAAR) 100 MG tablet   rosuvastatin (CRESTOR) 20 MG tablet     Endocrine   Diabetes mellitus (HCC)   Relevant Medications   glipiZIDE (GLUCOTROL XL) 5 MG 24 hr tablet   insulin glargine (LANTUS) 100 UNIT/ML injection   Insulin Syringe-Needle U-100 (TRUEPLUS INSULIN SYRINGE) 31G X 5/16" 0.3 ML MISC   losartan (COZAAR) 100 MG tablet   metFORMIN (GLUCOPHAGE) 1000 MG tablet   rosuvastatin (CRESTOR) 20 MG tablet     Other   Hyperglycemia - Primary   Relevant Orders   HgB A1c (Completed)    Other Visit Diagnoses    Essential hypertension       Relevant Medications   amLODipine (NORVASC) 10 MG tablet   labetalol (NORMODYNE) 200 MG tablet   losartan (COZAAR) 100 MG tablet   rosuvastatin (CRESTOR) 20 MG tablet      Meds ordered this encounter  Medications  . amLODipine (NORVASC) 10 MG tablet    Sig: Take 1 tablet (10 mg total) by mouth daily. To lower blood pressure    Dispense:  30 tablet    Refill:  0    Order Specific Question:   Supervising Provider    Answer:   Joya Gaskins, PATRICK E [[id number]]  . glipiZIDE (GLUCOTROL XL) 5 MG 24 hr tablet    Sig: Take 1 tablet (5 mg total) by mouth daily with breakfast. To lower blood sugar    Dispense:  30 tablet    Refill:  0    Order  Specific Question:   Supervising Provider    Answer:   Asencion Noble E [[id number]]  . insulin glargine (LANTUS) 100 UNIT/ML injection    Sig: Inject 0.2 mLs (20 Units total) into the skin at bedtime.    Dispense:  10 mL    Refill:  5    Dose change    Order Specific Question:   Supervising Provider    Answer:   Asencion Noble E [[id number]]  . Insulin Syringe-Needle U-100 (TRUEPLUS INSULIN SYRINGE) 31G X 5/16" 0.3 ML MISC    Sig: Use to inject insulin daily.    Dispense:  100 each    Refill:  11    Order Specific Question:   Supervising Provider    Answer:   Asencion Noble  E [[id number]]  . labetalol (NORMODYNE) 200 MG tablet    Sig: Take 1 tablet (200 mg total) by mouth 2 (two) times daily. To lower blood pressure    Dispense:  60 tablet    Refill:  0    Order Specific Question:   Supervising Provider    Answer:   Joya Gaskins, PATRICK E [[id number]]  . losartan (COZAAR) 100 MG tablet    Sig: Take 1 tablet (100 mg total) by mouth daily. To lower blood pressure    Dispense:  30 tablet    Refill:  2    Order Specific Question:   Supervising Provider    Answer:   Joya Gaskins, PATRICK E [[id number]]  . metFORMIN (GLUCOPHAGE) 1000 MG tablet    Sig: TAKE 1 TABLET BY MOUTH 2 TIMES DAILY WITH A MEAL.    Dispense:  60 tablet    Refill:  0    Order Specific Question:   Supervising Provider    Answer:   Joya Gaskins, PATRICK E [[id number]]  . rosuvastatin (CRESTOR) 20 MG tablet    Sig: Take 1 tablet (20 mg total) by mouth daily.    Dispense:  30 tablet    Refill:  0    Order Specific Question:   Supervising Provider    Answer:   Joya Gaskins, PATRICK E [[id number]]  1. Hyperglycemia A1C 9.4, patient has f/u with PCP 05/20/20 - HgB A1c  2. Essential hypertension Patient encouraged to check blood pressure on a daily basis, keep a written log and have available for all office visits. - amLODipine (NORVASC) 10 MG tablet; Take 1 tablet (10 mg total) by mouth daily. To lower blood pressure  Dispense: 30 tablet; Refill: 0 - labetalol (NORMODYNE)  200 MG tablet; Take 1 tablet (200 mg total) by mouth 2 (two) times daily. To lower blood pressure  Dispense: 60 tablet; Refill: 0 - losartan (COZAAR) 100 MG tablet; Take 1 tablet (100 mg total) by mouth daily. To lower blood pressure  Dispense: 30 tablet; Refill: 2  3. Type 2 diabetes mellitus with other circulatory complication, with long-term current use of insulin (HCC) Continue current regimen, patient encouraged to check blood glucose levels at home on a daily basis, keep a written log and have available for all office visits - glipiZIDE (GLUCOTROL XL) 5 MG 24 hr tablet; Take 1 tablet (5 mg total) by mouth daily with breakfast. To lower blood sugar  Dispense: 30 tablet; Refill: 0 - insulin glargine (LANTUS) 100 UNIT/ML injection; Inject 0.2 mLs (20 Units total) into the skin at bedtime.  Dispense: 10 mL; Refill: 5 - Insulin Syringe-Needle U-100 (TRUEPLUS INSULIN SYRINGE) 31G X 5/16" 0.3 ML MISC; Use to inject insulin daily.  Dispense: 100 each; Refill: 11 - metFORMIN (GLUCOPHAGE) 1000 MG tablet; TAKE 1 TABLET BY MOUTH 2 TIMES DAILY WITH A MEAL.  Dispense: 60 tablet; Refill: 0 - rosuvastatin (CRESTOR) 20 MG tablet; Take 1 tablet (20 mg total) by mouth daily.  Dispense: 30 tablet; Refill: 0  4. Cerebellar stroke [facility]) Maintain follow-up with cardiology - losartan (COZAAR) 100 MG tablet; Take 1 tablet (100 mg total) by mouth daily. To lower blood pressure  Dispense: 30 tablet; Refill: 2 - rosuvastatin (CRESTOR) 20 MG tablet; Take 1 tablet (20 mg total) by mouth daily.  Dispense: 30 tablet; Refill: 0     I have reviewed the patient's medical history (PMH, PSH, Social History, Family History, Medications, and allergies) , and have been updated if relevant. I spent 20 minutes reviewing chart and  face to face time with patient.    Follow-up: Return in about 1 month (around 05/20/2020) for [facility].    Loraine Grip Mayers, PA-C

## 2020-04-17 ENCOUNTER — Telehealth: Payer: Self-pay

## 2020-04-17 NOTE — Telephone Encounter (Signed)
REFERRAL ONLY FROM Mechele Dawley [phone], SENT REFERRAL TO SCHEDULING

## 2020-05-04 LAB — CUP PACEART REMOTE DEVICE CHECK
Date Time Interrogation Session: 20220325012205
Implantable Pulse Generator Implant Date: 20200302

## 2020-05-06 ENCOUNTER — Ambulatory Visit (INDEPENDENT_AMBULATORY_CARE_PROVIDER_SITE_OTHER): Payer: Self-pay

## 2020-05-06 DIAGNOSIS — I639 Cerebral infarction, unspecified: Secondary | ICD-10-CM

## 2020-05-15 NOTE — Progress Notes (Signed)
Carelink Summary Report / Loop Recorder 

## 2020-05-20 ENCOUNTER — Other Ambulatory Visit: Payer: Self-pay

## 2020-05-20 ENCOUNTER — Ambulatory Visit: Payer: Self-pay | Attending: Internal Medicine | Admitting: Internal Medicine

## 2020-05-20 DIAGNOSIS — Z538 Procedure and treatment not carried out for other reasons: Secondary | ICD-10-CM

## 2020-05-20 NOTE — Progress Notes (Signed)
Phone call placed to patient due to telephone visit.  Patient did not answer.  [employer] interpreter: [phone], Clarita Crane I will have our front desk reschedule her appointment.

## 2020-05-27 ENCOUNTER — Other Ambulatory Visit: Payer: Self-pay

## 2020-05-27 ENCOUNTER — Other Ambulatory Visit: Payer: Self-pay | Admitting: Physician Assistant

## 2020-05-27 DIAGNOSIS — E1159 Type 2 diabetes mellitus with other circulatory complications: Secondary | ICD-10-CM

## 2020-05-27 DIAGNOSIS — Z794 Long term (current) use of insulin: Secondary | ICD-10-CM

## 2020-05-28 ENCOUNTER — Ambulatory Visit: Payer: Self-pay | Admitting: Internal Medicine

## 2020-05-29 ENCOUNTER — Ambulatory Visit: Payer: Self-pay | Attending: Internal Medicine | Admitting: Nurse Practitioner

## 2020-05-29 ENCOUNTER — Other Ambulatory Visit: Payer: Self-pay

## 2020-05-29 ENCOUNTER — Encounter: Payer: Self-pay | Admitting: Nurse Practitioner

## 2020-05-29 ENCOUNTER — Other Ambulatory Visit: Payer: Self-pay | Admitting: Physician Assistant

## 2020-05-29 DIAGNOSIS — I10 Essential (primary) hypertension: Secondary | ICD-10-CM

## 2020-05-29 DIAGNOSIS — Z794 Long term (current) use of insulin: Secondary | ICD-10-CM

## 2020-05-29 DIAGNOSIS — Z1231 Encounter for screening mammogram for malignant neoplasm of breast: Secondary | ICD-10-CM

## 2020-05-29 DIAGNOSIS — E1159 Type 2 diabetes mellitus with other circulatory complications: Secondary | ICD-10-CM

## 2020-05-29 DIAGNOSIS — E785 Hyperlipidemia, unspecified: Secondary | ICD-10-CM

## 2020-05-29 MED ORDER — LABETALOL HCL 200 MG PO TABS
200.0000 mg | ORAL_TABLET | Freq: Two times a day (BID) | ORAL | 0 refills | Status: DC
  Filled 2020-05-29: qty 60, 30d supply, fill #0
  Filled 2020-07-15: qty 4, 2d supply, fill #1
  Filled 2020-07-15: qty 56, 28d supply, fill #1
  Filled 2020-07-15: qty 60, 30d supply, fill #1
  Filled 2020-07-15: qty 4, 2d supply, fill #1
  Filled 2020-10-11: qty 60, 30d supply, fill #2

## 2020-05-29 MED ORDER — TRUE METRIX BLOOD GLUCOSE TEST VI STRP
ORAL_STRIP | 12 refills | Status: AC
  Filled 2020-05-29: qty 100, 30d supply, fill #0
  Filled 2021-04-15: qty 100, 50d supply, fill #0

## 2020-05-29 MED ORDER — LOSARTAN POTASSIUM 100 MG PO TABS
100.0000 mg | ORAL_TABLET | Freq: Every day | ORAL | 0 refills | Status: DC
  Filled 2020-05-29: qty 30, 30d supply, fill #0

## 2020-05-29 MED ORDER — ROSUVASTATIN CALCIUM 20 MG PO TABS
20.0000 mg | ORAL_TABLET | Freq: Every day | ORAL | 3 refills | Status: DC
  Filled 2020-05-29: qty 30, 30d supply, fill #0

## 2020-05-29 MED ORDER — METFORMIN HCL 1000 MG PO TABS
1000.0000 mg | ORAL_TABLET | Freq: Two times a day (BID) | ORAL | 0 refills | Status: DC
  Filled 2020-05-29: qty 60, 30d supply, fill #0
  Filled 2020-07-15: qty 60, 30d supply, fill #1
  Filled 2020-10-11: qty 60, 30d supply, fill #2

## 2020-05-29 MED ORDER — AMLODIPINE BESYLATE 10 MG PO TABS
10.0000 mg | ORAL_TABLET | Freq: Every day | ORAL | 0 refills | Status: DC
  Filled 2020-05-29: qty 30, 30d supply, fill #0

## 2020-05-29 MED ORDER — TRUEPLUS LANCETS 28G MISC
11 refills | Status: AC
  Filled 2020-05-29: qty 100, 30d supply, fill #0

## 2020-05-29 NOTE — Progress Notes (Signed)
Virtual Visit via Telephone Note Due to national recommendations of social distancing due to COVID 19, telehealth visit is felt to be most appropriate for this patient at this time.  I discussed the limitations, risks, security and privacy concerns of performing an evaluation and management service by telephone and the availability of in person appointments. I also discussed with the patient that there may be a patient responsible charge related to this service. The patient expressed understanding and agreed to proceed.    I connected with Tivis Ringer on 05/29/20  at  10:10 AM EDT  EDT by telephone and verified that I am speaking with the correct person using two identifiers.   Consent I discussed the limitations, risks, security and privacy concerns of performing an evaluation and management service by telephone and the availability of in person appointments. I also discussed with the patient that there may be a patient responsible charge related to this service. The patient expressed understanding and agreed to proceed.   Location of Patient: Private Residence   Location of Provider: [facility]    Persons participating in Telemedicine visit: Bertram Denver FNP-BC YY [place] CMA Yobana Culliton ID# [id number]   History of Present Illness: Telemedicine visit for: Follow up  has a past medical history of Diabetes mellitus without complication (HCC), Hypertension, and Pneumonia (05/21/2016).    DM2 Poorly controlled. Blood glucose this morning 280. Postprandial yesterday 300. She is administering 17 units of lantus and not 20 units which was prescribed in March. She does endorse adherence with taking glipizide 5 mg daily and metformin 1000 mg BID however she has been out of these medications for several days now. LDL not at goal. Crestor refilled today.  Lab Results  Component Value Date   HGBA1C 9.4 (A) 04/16/2020   Lab Results  Component  Value Date   LDLCALC 102 (H) 04/27/2019   Essential Hypertension Poorly controlled. Taking losartan 100 mg daily, normodyne 200 mg BID, amlodipine 10 mg daily. Denies chest pain, shortness of breath, palpitations, lightheadedness, dizziness, headaches or BLE edema. She states home blood pressure readings averaging: SBP 130-160s.  BP Readings from Last 3 Encounters:  04/16/20 (!) 174/90  04/15/20 (!) 184/65  04/27/19 (!) 149/83     Past Medical History:  Diagnosis Date  . Diabetes mellitus without complication (HCC)   . Hypertension   . Pneumonia 05/21/2016    Past Surgical History:  Procedure Laterality Date  . ABDOMINAL HYSTERECTOMY    . LOOP RECORDER INSERTION N/A 04/11/2018   Procedure: LOOP RECORDER INSERTION;  Surgeon: Marinus Maw, MD;  Location: [facility] INVASIVE CV LAB;  Service: Cardiovascular;  Laterality: N/A;  . TEE WITHOUT CARDIOVERSION N/A 02/08/2018   Procedure: TRANSESOPHAGEAL ECHOCARDIOGRAM (TEE);  Surgeon: Jake Bathe, MD;  Location: [facility] ENDOSCOPY;  Service: Cardiovascular;  Laterality: N/A;    Family History  Problem Relation Age of Onset  . Other Mother        parents passed away in an earthquake in 58's  . Heart attack Sister   . Obesity Sister     Social History   Socioeconomic History  . Marital status: Single    Spouse name: Not on file  . Number of children: Not on file  . Years of education: Not on file  . Highest education level: Not on file  Occupational History  . Not on file  Tobacco Use  . Smoking status: Never Smoker  . Smokeless tobacco: Never Used  Vaping Use  .  Vaping Use: Never used  Substance and Sexual Activity  . Alcohol use: Yes    Comment: on rare occasions.   . Drug use: No  . Sexual activity: Not Currently  Other Topics Concern  . Not on file  Social History Narrative  . Not on file   Social Determinants of Health   Financial Resource Strain: Not on file  Food Insecurity: Not on file  Transportation Needs: Not on  file  Physical Activity: Not on file  Stress: Not on file  Social Connections: Not on file     Observations/Objective: Awake, alert and oriented x 3   Review of Systems  Constitutional: Negative for fever, malaise/fatigue and weight loss.  HENT: Negative.  Negative for nosebleeds.   Eyes: Negative.  Negative for blurred vision, double vision and photophobia.  Respiratory: Negative.  Negative for cough and shortness of breath.   Cardiovascular: Negative.  Negative for chest pain, palpitations and leg swelling.  Gastrointestinal: Negative.  Negative for heartburn, nausea and vomiting.  Musculoskeletal: Negative.  Negative for myalgias.  Neurological: Negative.  Negative for dizziness, focal weakness, seizures and headaches.  Psychiatric/Behavioral: Negative.  Negative for suicidal ideas.    Assessment and Plan: Porcia was seen today for medication refill.  Diagnoses and all orders for this visit:  Type 2 diabetes mellitus with other circulatory complication, with long-term current use of insulin (HCC) -     metFORMIN (GLUCOPHAGE) 1000 MG tablet; Take 1 tablet (1,000 mg total) by mouth 2 (two) times daily with a meal. -     glucose blood (TRUE METRIX BLOOD GLUCOSE TEST) test strip; Use as instructed -     TRUEplus Lancets 28G MISC; Use to check blood sugar daily. Continue blood sugar control as discussed in office today, low carbohydrate diet, and regular physical exercise as tolerated, 150 minutes per week (30 min each day, 5 days per week, or 50 min 3 days per week). Keep blood sugar logs with fasting goal of 90-130 mg/dl, post prandial (after you eat) less than 180.  For Hypoglycemia: BS <60 and Hyperglycemia BS >400; contact the clinic ASAP. Annual eye exams and foot exams are recommended.   Essential hypertension -     losartan (COZAAR) 100 MG tablet; Take 1 tablet (100 mg total) by mouth daily. -     labetalol (NORMODYNE) 200 MG tablet; Take 1 tablet (200 mg total) by mouth 2  (two) times daily. -     amLODipine (NORVASC) 10 MG tablet; Take 1 tablet (10 mg total) by mouth daily. to lower blood pressure Continue all antihypertensives as prescribed.  Remember to bring in your blood pressure log with you for your follow up appointment.  DASH/Mediterranean Diets are healthier choices for HTN.    Hyperlipidemia LDL goal <70 -     rosuvastatin (CRESTOR) 20 MG tablet; Take 1 tablet (20 mg total) by mouth daily. INSTRUCTIONS: Work on a low fat, heart healthy diet and participate in regular aerobic exercise program by working out at least 150 minutes per week; 5 days a week-30 minutes per day. Avoid red meat/beef/steak,  fried foods. junk foods, sodas, sugary drinks, unhealthy snacking, alcohol and smoking.  Drink at least 80 oz of water per day and monitor your carbohydrate intake daily.    Breast cancer screening by mammogram -     MM DIGITAL SCREENING BILATERAL; Future     Follow Up Instructions Return in about 2 months (around 07/29/2020).     I discussed the assessment and  treatment plan with the patient. The patient was provided an opportunity to ask questions and all were answered. The patient agreed with the plan and demonstrated an understanding of the instructions.   The patient was advised to call back or seek an in-person evaluation if the symptoms worsen or if the condition fails to improve as anticipated.  I provided 18 minutes of non-face-to-face time during this encounter including median intraservice time, reviewing previous notes, labs, imaging, medications and explaining diagnosis and management.  Claiborne Rigg, FNP-BC

## 2020-05-30 ENCOUNTER — Other Ambulatory Visit: Payer: Self-pay

## 2020-06-05 ENCOUNTER — Other Ambulatory Visit: Payer: Self-pay

## 2020-06-06 ENCOUNTER — Other Ambulatory Visit: Payer: Self-pay

## 2020-06-10 ENCOUNTER — Ambulatory Visit (INDEPENDENT_AMBULATORY_CARE_PROVIDER_SITE_OTHER): Payer: Self-pay

## 2020-06-10 DIAGNOSIS — I639 Cerebral infarction, unspecified: Secondary | ICD-10-CM

## 2020-06-10 LAB — CUP PACEART REMOTE DEVICE CHECK
Date Time Interrogation Session: 20220427013638
Implantable Pulse Generator Implant Date: 20200302

## 2020-06-24 ENCOUNTER — Encounter: Payer: Self-pay | Admitting: Neurology

## 2020-06-24 ENCOUNTER — Ambulatory Visit: Payer: Self-pay | Admitting: Neurology

## 2020-06-27 NOTE — Progress Notes (Signed)
Carelink Summary Report / Loop Recorder 

## 2020-07-08 LAB — CUP PACEART REMOTE DEVICE CHECK
Date Time Interrogation Session: 20220530014611
Implantable Pulse Generator Implant Date: 20200302

## 2020-07-09 ENCOUNTER — Ambulatory Visit (INDEPENDENT_AMBULATORY_CARE_PROVIDER_SITE_OTHER): Payer: Self-pay

## 2020-07-09 DIAGNOSIS — I639 Cerebral infarction, unspecified: Secondary | ICD-10-CM

## 2020-07-15 ENCOUNTER — Other Ambulatory Visit: Payer: Self-pay

## 2020-07-15 MED FILL — Insulin Syringe/Needle U-100 0.3 ML 31 x 5/16": 100 days supply | Qty: 100 | Fill #0 | Status: AC

## 2020-07-18 ENCOUNTER — Ambulatory Visit: Payer: Self-pay | Admitting: Internal Medicine

## 2020-07-23 ENCOUNTER — Other Ambulatory Visit: Payer: Self-pay

## 2020-07-30 NOTE — Progress Notes (Signed)
Carelink Summary Report / Loop Recorder 

## 2020-08-13 ENCOUNTER — Ambulatory Visit (INDEPENDENT_AMBULATORY_CARE_PROVIDER_SITE_OTHER): Payer: Self-pay

## 2020-08-13 DIAGNOSIS — I639 Cerebral infarction, unspecified: Secondary | ICD-10-CM

## 2020-08-14 LAB — CUP PACEART REMOTE DEVICE CHECK
Date Time Interrogation Session: 20220702014556
Implantable Pulse Generator Implant Date: 20200302

## 2020-08-30 NOTE — Progress Notes (Signed)
Carelink Summary Report / Loop Recorder 

## 2020-09-12 LAB — CUP PACEART REMOTE DEVICE CHECK
Date Time Interrogation Session: 20220804014955
Implantable Pulse Generator Implant Date: 20200302

## 2020-09-16 ENCOUNTER — Ambulatory Visit (INDEPENDENT_AMBULATORY_CARE_PROVIDER_SITE_OTHER): Payer: Self-pay

## 2020-09-16 DIAGNOSIS — I639 Cerebral infarction, unspecified: Secondary | ICD-10-CM

## 2020-10-08 NOTE — Progress Notes (Signed)
[facility] Summary Report 

## 2020-10-11 ENCOUNTER — Other Ambulatory Visit: Payer: Self-pay

## 2020-10-21 ENCOUNTER — Ambulatory Visit (INDEPENDENT_AMBULATORY_CARE_PROVIDER_SITE_OTHER): Payer: Self-pay

## 2020-10-21 DIAGNOSIS — I639 Cerebral infarction, unspecified: Secondary | ICD-10-CM

## 2020-10-23 LAB — CUP PACEART REMOTE DEVICE CHECK
Date Time Interrogation Session: 20220906015515
Implantable Pulse Generator Implant Date: 20200302

## 2020-10-25 NOTE — Progress Notes (Signed)
Carelink Summary Report / Loop Recorder 

## 2020-11-25 ENCOUNTER — Ambulatory Visit (INDEPENDENT_AMBULATORY_CARE_PROVIDER_SITE_OTHER): Payer: Self-pay

## 2020-11-25 DIAGNOSIS — I639 Cerebral infarction, unspecified: Secondary | ICD-10-CM

## 2020-11-26 LAB — CUP PACEART REMOTE DEVICE CHECK
Date Time Interrogation Session: 20221009015944
Implantable Pulse Generator Implant Date: 20200302

## 2020-12-03 NOTE — Progress Notes (Signed)
Carelink Summary Report / Loop Recorder 

## 2020-12-20 LAB — CUP PACEART REMOTE DEVICE CHECK
Date Time Interrogation Session: 20221111010100
Implantable Pulse Generator Implant Date: 20200302

## 2020-12-30 ENCOUNTER — Ambulatory Visit (INDEPENDENT_AMBULATORY_CARE_PROVIDER_SITE_OTHER): Payer: Self-pay

## 2020-12-30 DIAGNOSIS — I639 Cerebral infarction, unspecified: Secondary | ICD-10-CM

## 2021-01-07 NOTE — Progress Notes (Signed)
Carelink Summary Report / Loop Recorder 

## 2021-01-16 ENCOUNTER — Other Ambulatory Visit: Payer: Self-pay | Admitting: Nurse Practitioner

## 2021-01-16 ENCOUNTER — Other Ambulatory Visit: Payer: Self-pay

## 2021-01-16 DIAGNOSIS — E1159 Type 2 diabetes mellitus with other circulatory complications: Secondary | ICD-10-CM

## 2021-01-16 DIAGNOSIS — I10 Essential (primary) hypertension: Secondary | ICD-10-CM

## 2021-01-16 DIAGNOSIS — Z794 Long term (current) use of insulin: Secondary | ICD-10-CM

## 2021-01-16 MED ORDER — METFORMIN HCL 1000 MG PO TABS
1000.0000 mg | ORAL_TABLET | Freq: Two times a day (BID) | ORAL | 0 refills | Status: DC
  Filled 2021-01-16: qty 60, 30d supply, fill #0

## 2021-01-16 MED ORDER — LABETALOL HCL 200 MG PO TABS
200.0000 mg | ORAL_TABLET | Freq: Two times a day (BID) | ORAL | 0 refills | Status: DC
  Filled 2021-01-16: qty 60, 30d supply, fill #0

## 2021-01-16 MED FILL — Insulin Syringe/Needle U-100 0.3 ML 31 x 5/16": 100 days supply | Qty: 100 | Fill #1 | Status: AC

## 2021-01-20 ENCOUNTER — Ambulatory Visit: Payer: Self-pay | Attending: Internal Medicine | Admitting: Internal Medicine

## 2021-01-20 ENCOUNTER — Ambulatory Visit (HOSPITAL_BASED_OUTPATIENT_CLINIC_OR_DEPARTMENT_OTHER): Payer: Self-pay | Admitting: Clinical

## 2021-01-20 ENCOUNTER — Encounter: Payer: Self-pay | Admitting: Internal Medicine

## 2021-01-20 ENCOUNTER — Other Ambulatory Visit: Payer: Self-pay

## 2021-01-20 VITALS — BP 153/92 | HR 77 | Resp 16 | Wt 191.0 lb

## 2021-01-20 DIAGNOSIS — E785 Hyperlipidemia, unspecified: Secondary | ICD-10-CM

## 2021-01-20 DIAGNOSIS — F321 Major depressive disorder, single episode, moderate: Secondary | ICD-10-CM

## 2021-01-20 DIAGNOSIS — M79645 Pain in left finger(s): Secondary | ICD-10-CM

## 2021-01-20 DIAGNOSIS — E1169 Type 2 diabetes mellitus with other specified complication: Secondary | ICD-10-CM

## 2021-01-20 DIAGNOSIS — Z1231 Encounter for screening mammogram for malignant neoplasm of breast: Secondary | ICD-10-CM

## 2021-01-20 DIAGNOSIS — Z23 Encounter for immunization: Secondary | ICD-10-CM

## 2021-01-20 DIAGNOSIS — E669 Obesity, unspecified: Secondary | ICD-10-CM

## 2021-01-20 DIAGNOSIS — I152 Hypertension secondary to endocrine disorders: Secondary | ICD-10-CM

## 2021-01-20 DIAGNOSIS — F320 Major depressive disorder, single episode, mild: Secondary | ICD-10-CM

## 2021-01-20 DIAGNOSIS — Z1211 Encounter for screening for malignant neoplasm of colon: Secondary | ICD-10-CM

## 2021-01-20 DIAGNOSIS — E1159 Type 2 diabetes mellitus with other circulatory complications: Secondary | ICD-10-CM

## 2021-01-20 LAB — POCT GLYCOSYLATED HEMOGLOBIN (HGB A1C): HbA1c, POC (controlled diabetic range): 9.4 % — AB (ref 0.0–7.0)

## 2021-01-20 LAB — GLUCOSE, POCT (MANUAL RESULT ENTRY): POC Glucose: 227 mg/dl — AB (ref 70–99)

## 2021-01-20 MED ORDER — LOSARTAN POTASSIUM 100 MG PO TABS
100.0000 mg | ORAL_TABLET | Freq: Every day | ORAL | 3 refills | Status: DC
  Filled 2021-01-20 – 2021-11-21 (×2): qty 30, 30d supply, fill #0

## 2021-01-20 MED ORDER — AMLODIPINE BESYLATE 10 MG PO TABS
10.0000 mg | ORAL_TABLET | Freq: Every day | ORAL | 3 refills | Status: DC
  Filled 2021-01-20 – 2021-11-21 (×2): qty 30, 30d supply, fill #0

## 2021-01-20 MED ORDER — INSULIN GLARGINE 100 UNIT/ML ~~LOC~~ SOLN
17.0000 [IU] | Freq: Every day | SUBCUTANEOUS | 5 refills | Status: DC
  Filled 2021-01-20: qty 10, 58d supply, fill #0
  Filled 2021-04-15: qty 20, 56d supply, fill #0
  Filled 2021-07-21: qty 40, 112d supply, fill #1

## 2021-01-20 MED ORDER — METFORMIN HCL 1000 MG PO TABS
1000.0000 mg | ORAL_TABLET | Freq: Two times a day (BID) | ORAL | 3 refills | Status: DC
  Filled 2021-01-20: qty 180, 90d supply, fill #0
  Filled 2021-01-20 – 2021-04-15 (×2): qty 60, 30d supply, fill #0
  Filled 2021-05-19: qty 60, 30d supply, fill #1
  Filled 2021-07-21: qty 60, 30d supply, fill #2
  Filled 2021-11-21: qty 60, 30d supply, fill #3

## 2021-01-20 MED ORDER — LABETALOL HCL 200 MG PO TABS
200.0000 mg | ORAL_TABLET | Freq: Two times a day (BID) | ORAL | 2 refills | Status: DC
  Filled 2021-01-20: qty 180, 90d supply, fill #0
  Filled 2021-01-20 – 2021-04-15 (×2): qty 60, 30d supply, fill #0
  Filled 2021-05-19: qty 60, 30d supply, fill #1
  Filled 2021-07-21: qty 60, 30d supply, fill #2
  Filled 2021-11-21: qty 60, 30d supply, fill #3

## 2021-01-20 MED ORDER — GLIPIZIDE ER 5 MG PO TB24
5.0000 mg | ORAL_TABLET | Freq: Every day | ORAL | 3 refills | Status: DC
  Filled 2021-01-20 – 2021-11-21 (×2): qty 30, 30d supply, fill #0

## 2021-01-20 MED ORDER — DULOXETINE HCL 20 MG PO CPEP
20.0000 mg | ORAL_CAPSULE | Freq: Every day | ORAL | 1 refills | Status: DC
  Filled 2021-01-20: qty 30, 30d supply, fill #0

## 2021-01-20 MED ORDER — ROSUVASTATIN CALCIUM 20 MG PO TABS
20.0000 mg | ORAL_TABLET | Freq: Every day | ORAL | 3 refills | Status: DC
  Filled 2021-01-20 – 2021-11-21 (×2): qty 30, 30d supply, fill #0

## 2021-01-20 NOTE — Progress Notes (Signed)
Patient ID: April Fry, female    DOB: March 07, 1960  MRN: [id number]  CC: Joint Pain, Diabetes, Wrist Pain, feet pain, and Back Pain (Left lower side )   Subjective: April Fry is a 60 y.o. female who presents for chronic ds management.  Previous PCP was Dr. Chapman Fitch Her concerns today include:  Patient with history of DM type II, HTN, HL, LBBB, cryptogenic CVA, obesity  She did not bring meds with her  DIABETES TYPE 2 Last A1C:   Results for orders placed or performed in visit on 01/20/21  POCT glucose (manual entry)  Result Value Ref Range   POC Glucose 227 (A) 70 - 99 mg/dl  POCT glycosylated hemoglobin (Hb A1C)  Result Value Ref Range   Hemoglobin A1C     HbA1c POC (<> result, manual entry)     HbA1c, POC (prediabetic range)     HbA1c, POC (controlled diabetic range) 9.4 (A) 0.0 - 7.0 %    Med Adherence:  '[x]'  Yes -Lantus 17 (suppose to be 20 units daily), Metformin 1 gram BID and Glipizide 5 mg daily.  She does not recall the Glipizide.  Out of Metformin x 2 wks, does not have Glipizide. Out of Lantus x 3 days Medication side effects:  '[]'  Yes    '[x]'  No Home Monitoring?  '[]'  Yes    '[x]'  No but does have supplies Home glucose results range: Diet Adherence: '[x]'  Yes    '[]'  No Exercise: '[]'  Yes    '[x]'  No Hypoglycemic episodes?: '[]'  Yes    '[x]'  No Numbness of the feet? '[]'  Yes    '[x]'  No Retinopathy hx? '[]'  Yes    '[]'  No Last eye exam: over due for eye exam.  No insurance Comments:   HYPERTENSION Currently taking: see medication list - Norvasc, Cozaar and Labetalol Med Adherence: '[x]'  Yes  but has not taken them as yet for today.  However, I pointed out to her that based on last time rxns were written she should be out of them.  She then told me she has been out of meds x 3 wks Medication side effects: '[]'  Yes    '[]'  No Adherence with salt restriction: '[x]'  Yes    '[]'  No Home Monitoring?: '[]'  Yes    '[x]'  No Monitoring Frequency:  Home BP results range:  SOB? '[]'  Yes    '[x]'   No Chest Pain?: '[]'  Yes    '[x]'  No Leg swelling?: '[]'  Yes    '[x]'  No Headaches?: '[]'  Yes    '[]'  No Dizziness? '[]'  Yes    '[]'  No Comments:   HL:  out of Crestor x several wks  Positive depression screen today with positive answer to SI question.  Reports depression "is getting bad.  I cry all the time." -"I'm good for nothing now.  I can not walk or move. "  Depressed x 3 mths.  Thinks she broke LT thumb about 3 wks ago after grabbing her tray and had hyper extension of the thumb.  Reports that it it was painful and sore and the joint pops when she moves it.  She has been taking an anti-inflammatory medication from [place] which has helped a lot.  Patient Active Problem List   Diagnosis Date Noted   Cryptogenic stroke ([place]) 04/11/2018   Hyperglycemia    Hypokalemia    Hyperlipidemia LDL goal <70    Class 1 obesity due to excess calories with serious comorbidity and body mass index (BMI) of 33.0 to  33.9 in adult    Cerebellar stroke ([place]) 01/31/2018   Hyperlipidemia associated with type 2 diabetes mellitus ([place]) 06/17/2016   Diabetes mellitus ([place]) 05/21/2016   Essential hypertension 05/21/2016   LBBB (left bundle branch block) 05/21/2016     Current Outpatient Medications on File Prior to Visit  Medication Sig Dispense Refill   blood glucose meter kit and supplies KIT Dispense based on patient and insurance preference. Use up to four times daily as directed. (FOR ICD-9 250.00, 250.01). 1 each 0   Blood Glucose Monitoring Suppl (TRUE METRIX METER) w/Device KIT Use to check blood sugar in the morning. 1 kit 0   glucose blood (TRUE METRIX BLOOD GLUCOSE TEST) test strip Use as instructed 100 each 12   Insulin Syringe-Needle U-100 31G X 5/16" 0.3 ML MISC USE TO INJECT INSULIN DAILY. 100 each 11   TRUEplus Lancets 28G MISC Use to check blood sugar daily. 100 each 11   Current Facility-Administered Medications on File Prior to Visit  Medication Dose Route Frequency Provider Last Rate Last Admin    pneumococcal 13-valent conjugate vaccine (PREVNAR 13) injection 0.5 mL  0.5 mL Intramuscular Once Hairston, Maylon Peppers, FNP        No Known Allergies  Social History   Socioeconomic History   Marital status: Single    Spouse name: Not on file   Number of children: Not on file   Years of education: Not on file   Highest education level: Not on file  Occupational History   Not on file  Tobacco Use   Smoking status: Never   Smokeless tobacco: Never  Vaping Use   Vaping Use: Never used  Substance and Sexual Activity   Alcohol use: Yes    Comment: on rare occasions.    Drug use: No   Sexual activity: Not Currently  Other Topics Concern   Not on file  Social History Narrative   Not on file   Social Determinants of Health   Financial Resource Strain: Not on file  Food Insecurity: Not on file  Transportation Needs: Not on file  Physical Activity: Not on file  Stress: Not on file  Social Connections: Not on file  Intimate Partner Violence: Not on file    Family History  Problem Relation Age of Onset   Other Mother        parents passed away in an earthquake in 1980's   Heart attack Sister    Obesity Sister     Past Surgical History:  Procedure Laterality Date   ABDOMINAL HYSTERECTOMY     LOOP RECORDER INSERTION N/A 04/11/2018   Procedure: [place];  Surgeon: Evans Lance, MD;  Location: [place] [facility];  Service: Cardiovascular;  Laterality: N/A;   TEE WITHOUT CARDIOVERSION N/A 02/08/2018   Procedure: TRANSESOPHAGEAL ECHOCARDIOGRAM (TEE);  Surgeon: Jerline Pain, MD;  Location: [facility] ENDOS[facility];  Service: Cardiovascular;  Laterality: N/A;    ROS: Review of Systems Negative except as stated above  PHYSICAL EXAM: BP (!) 153/92   Pulse 77   Resp 16   Wt 191 lb (86.6 kg)   SpO2 96%   BMI 38.58 kg/m   Wt Readings from Last 3 Encounters:  01/20/21 191 lb (86.6 kg)  04/16/20 188 lb (85.3 kg)  04/27/19 193 lb 12.8 oz (87.9 kg)    Physical  Exam  General appearance - alert, well appearing, and in no distress Mental status -patient tearful at times. Neck - supple, no significant adenopathy Chest -  clear to auscultation, no wheezes, rales or rhonchi, symmetric air entry Heart - normal rate, regular rhythm, normal S1, S2, no murmurs, rubs, clicks or gallops Musculoskeletal -left hand: Slight enlargement of the CMC joint.  Mild discomfort with passive range of motion of the joints of the left thumb. Extremities - peripheral pulses normal, no pedal edema, no clubbing or cyanosis  Depression screen [facility] 2/9 01/20/2021 05/20/2020 04/27/2019  Decreased Interest 2 0 0  Down, Depressed, Hopeless 2 0 0  PHQ - 2 Score 4 0 0  Altered sleeping 3 - 0  Tired, decreased energy - - 0  Change in appetite 2 - 0  Feeling bad or failure about yourself  3 - 0  Trouble concentrating 2 - 0  Moving slowly or fidgety/restless 3 - 0  Suicidal thoughts 3 - 0  PHQ-9 Score 20 - 0  Difficult doing work/chores - - Not difficult at all    CMP Latest Ref Rng & Units 04/15/2020 04/27/2019 03/03/2018  Glucose 70 - 99 mg/dL 106(H) 208(H) 136(H)  BUN 6 - 20 mg/dL '13 8 9  ' Creatinine 0.44 - 1.00 mg/dL 0.46 0.51(L) 0.44(L)  Sodium 135 - 145 mmol/L 140 139 142  Potassium 3.5 - 5.1 mmol/L 4.0 4.7 4.2  Chloride 98 - 111 mmol/L 107 99 101  CO2 22 - 32 mmol/L 21(L) 25 19(L)  Calcium 8.9 - 10.3 mg/dL 9.0 9.3 9.4  Total Protein 6.0 - 8.5 g/dL - 7.3 -  Total Bilirubin 0.0 - 1.2 mg/dL - 0.4 -  Alkaline Phos 39 - 117 IU/L - 90 -  AST 0 - 40 IU/L - 17 -  ALT 0 - 32 IU/L - 20 -   Lipid Panel     Component Value Date/Time   CHOL 190 04/27/2019 1545   TRIG 239 (H) 04/27/2019 1545   HDL 47 04/27/2019 1545   CHOLHDL 4.0 04/27/2019 1545   CHOLHDL 3.2 02/01/2018 0011   VLDL 14 02/01/2018 0011   LDLCALC 102 (H) 04/27/2019 1545    CBC    Component Value Date/Time   WBC 8.8 04/15/2020 1251   RBC 4.66 04/15/2020 1251   HGB 13.0 04/15/2020 1251   HCT 39.6 04/15/2020  1251   PLT 313 04/15/2020 1251   MCV 85.0 04/15/2020 1251   MCH 27.9 04/15/2020 1251   MCHC 32.8 04/15/2020 1251   RDW 13.8 04/15/2020 1251   LYMPHSABS 1.1 01/31/2018 1035   MONOABS 0.3 01/31/2018 1035   EOSABS 0.0 01/31/2018 1035   BASOSABS 0.0 01/31/2018 1035    ASSESSMENT AND PLAN: 1. Type 2 diabetes mellitus with obesity (HCC) Not at goal.  Patient has been out of her medications.  Encourage compliance.  Refill sent on glipizide, metformin and Lantus.  Encourage healthy eating habits and try to move as much as she can. - POCT glucose (manual entry) - POCT glycosylated hemoglobin (Hb A1C) - CBC - Comprehensive metabolic panel - Microalbumin / creatinine urine ratio - glipiZIDE (GLUCOTROL XL) 5 MG 24 hr tablet; Take 1 tablet (5 mg total) by mouth daily with breakfast.  Dispense: 90 tablet; Refill: 3 - metFORMIN (GLUCOPHAGE) 1000 MG tablet; Take 1 tablet (1,000 mg total) by mouth 2 (two) times daily with a meal.  Dispense: 180 tablet; Refill: 3 - insulin glargine (LANTUS) 100 UNIT/ML injection; Inject 0.17 mLs (17 Units total) into the skin at bedtime.  Dispense: 10 mL; Refill: 5  2. Hyperlipidemia associated with type 2 diabetes mellitus (HCC) - Lipid panel - rosuvastatin (CRESTOR)  20 MG tablet; Take 1 tablet (20 mg total) by mouth daily.  Dispense: 90 tablet; Refill: 3  3. Hypertension associated with diabetes ([place]) Not at goal.  Patient has been out of meds.  Refills sent. - amLODipine (NORVASC) 10 MG tablet; Take 1 tablet (10 mg total) by mouth daily. to lower blood pressure  Dispense: 90 tablet; Refill: 3 - losartan (COZAAR) 100 MG tablet; Take 1 tablet (100 mg total) by mouth daily.  Dispense: 90 tablet; Refill: 3 - labetalol (NORMODYNE) 200 MG tablet; Take 1 tablet (200 mg total) by mouth 2 (two) times daily.  Dispense: 180 tablet; Refill: 2  4. Thumb pain, left I think she probably sprained the finger.  Doubt fracture.  We will get an x-ray. - DG Hand Complete Left;  Future  5. Major depressive disorder, single episode, moderate ([place]) Discussed management of depression.  She is agreeable to being placed on medicine.  We will put her on low-dose of Cymbalta.  Advised if any worsening depression or active suicidal thoughts, she should give [place] a call or be seen in the emergency room immediately.  She was agreeable to seeing the LCSW today. - DULoxetine (CYMBALTA) 20 MG capsule; Take 1 capsule (20 mg total) by mouth daily.  Dispense: 30 capsule; Refill: 1  6. Colon cancer screening Discussed colon cancer screening.  She is agreeable to doing FIT tests. - Fecal occult blood, imunochemical  7. Need for immunization against influenza - Flu Vaccine QUAD 60moIM (Fluarix, Fluzone & Alfiuria Quad PF)  8. Need for vaccination against Streptococcus pneumoniae PCV 15 given today.  9. Encounter for screening mammogram for malignant neoplasm of breast Given mammogram scholarship. - MM Digital Screening; Future    Patient was given the opportunity to ask questions.  Patient verbalized understanding of the plan and was able to repeat key elements of the plan.  [employer] Language interpreter used during this encounter. ##[id number] LMilbert Coulter Orders Placed This Encounter  Procedures   Fecal occult blood, imunochemical   MM Digital Screening   DG Hand Complete Left   Flu Vaccine QUAD 659moM (Fluarix, Fluzone & Alfiuria Quad PF)   CBC   Comprehensive metabolic panel   Lipid panel   Microalbumin / creatinine urine ratio   POCT glucose (manual entry)   POCT glycosylated hemoglobin (Hb A1C)     Requested Prescriptions   Signed Prescriptions Disp Refills   glipiZIDE (GLUCOTROL XL) 5 MG 24 hr tablet 90 tablet 3    Sig: Take 1 tablet (5 mg total) by mouth daily with breakfast.   metFORMIN (GLUCOPHAGE) 1000 MG tablet 180 tablet 3    Sig: Take 1 tablet (1,000 mg total) by mouth 2 (two) times daily with a meal.   amLODipine (NORVASC) 10 MG tablet 90 tablet 3    Sig: Take 1  tablet (10 mg total) by mouth daily. to lower blood pressure   losartan (COZAAR) 100 MG tablet 90 tablet 3    Sig: Take 1 tablet (100 mg total) by mouth daily.   rosuvastatin (CRESTOR) 20 MG tablet 90 tablet 3    Sig: Take 1 tablet (20 mg total) by mouth daily.   insulin glargine (LANTUS) 100 UNIT/ML injection 10 mL 5    Sig: Inject 0.17 mLs (17 Units total) into the skin at bedtime.   labetalol (NORMODYNE) 200 MG tablet 180 tablet 2    Sig: Take 1 tablet (200 mg total) by mouth 2 (two) times daily.   DULoxetine (CYMBALTA) 20 MG capsule  30 capsule 1    Sig: Take 1 capsule (20 mg total) by mouth daily.    Return in about 7 weeks (around 03/10/2021).  Karle Plumber, MD, FACP

## 2021-01-21 LAB — COMPREHENSIVE METABOLIC PANEL
ALT: 19 IU/L (ref 0–32)
AST: 15 IU/L (ref 0–40)
Albumin/Globulin Ratio: 1.5 (ref 1.2–2.2)
Albumin: 4.3 g/dL (ref 3.8–4.8)
Alkaline Phosphatase: 89 IU/L (ref 44–121)
BUN/Creatinine Ratio: 26 (ref 12–28)
BUN: 14 mg/dL (ref 8–27)
Bilirubin Total: 0.5 mg/dL (ref 0.0–1.2)
CO2: 23 mmol/L (ref 20–29)
Calcium: 9.2 mg/dL (ref 8.7–10.3)
Chloride: 102 mmol/L (ref 96–106)
Creatinine, Ser: 0.53 mg/dL — ABNORMAL LOW (ref 0.57–1.00)
Globulin, Total: 2.8 g/dL (ref 1.5–4.5)
Glucose: 206 mg/dL — ABNORMAL HIGH (ref 70–99)
Potassium: 4.6 mmol/L (ref 3.5–5.2)
Sodium: 140 mmol/L (ref 134–144)
Total Protein: 7.1 g/dL (ref 6.0–8.5)
eGFR: 105 mL/min/{1.73_m2} (ref >59)

## 2021-01-21 LAB — CBC
Hematocrit: 40 % (ref 34.0–46.6)
Hemoglobin: 13 g/dL (ref 11.1–15.9)
MCH: 27.9 pg (ref 26.6–33.0)
MCHC: 32.5 g/dL (ref 31.5–35.7)
MCV: 86 fL (ref 79–97)
Platelets: 269 10*3/uL (ref 150–450)
RBC: 4.66 x10E6/uL (ref 3.77–5.28)
RDW: 13.2 % (ref 11.7–15.4)
WBC: 6.1 10*3/uL (ref 3.4–10.8)

## 2021-01-21 LAB — LIPID PANEL
Chol/HDL Ratio: 4.5 ratio — ABNORMAL HIGH (ref 0.0–4.4)
Cholesterol, Total: 207 mg/dL — ABNORMAL HIGH (ref 100–199)
HDL: 46 mg/dL (ref >39)
LDL Chol Calc (NIH): 145 mg/dL — ABNORMAL HIGH (ref 0–99)
Triglycerides: 87 mg/dL (ref 0–149)
VLDL Cholesterol Cal: 16 mg/dL (ref 5–40)

## 2021-01-21 LAB — MICROALBUMIN / CREATININE URINE RATIO
Creatinine, Urine: 109.5 mg/dL
Microalb/Creat Ratio: 86 mg/g creat — ABNORMAL HIGH (ref 0–29)
Microalbumin, Urine: 93.7 ug/mL

## 2021-01-22 ENCOUNTER — Telehealth: Payer: Self-pay

## 2021-01-22 ENCOUNTER — Ambulatory Visit (INDEPENDENT_AMBULATORY_CARE_PROVIDER_SITE_OTHER): Payer: Self-pay

## 2021-01-22 DIAGNOSIS — I639 Cerebral infarction, unspecified: Secondary | ICD-10-CM

## 2021-01-22 LAB — CUP PACEART REMOTE DEVICE CHECK
Date Time Interrogation Session: 20221214010605
Implantable Pulse Generator Implant Date: 20200302

## 2021-01-22 NOTE — Telephone Encounter (Signed)
Contacted pt to go over lab results pt didn't answer lvm  [place] interpreter: Kristine Linea ID: [id number]  Sent a CRM and forward labs to NT to give pt labs when they call back

## 2021-01-24 NOTE — BH Specialist Note (Signed)
Integrated Behavioral Health Initial In-Person Visit  MRN: [id number] Name: April Fry  Number of Integrated Behavioral Health Clinician visits:: 1/6 Session Start time: 11:30am  Session End time: 12:00pm Total time: 30 minutes  Types of Service: Individual psychotherapy  Interpretor:Yes.   Interpretor Name and Language: Spanish Shon Hale [phone] & Mort Sawyers #[id number])   Warm Hand Off Completed.        Subjective: April Fry is a 60 y.o. female accompanied by  self Patient was referred by PCP Jonah Blue, MD for depression and financial issues. Patient reports the following symptoms/concerns: Reports feeling depressed, overwhelmed, self-esteem disturbance, and anxiousness. Reports that she has experienced physical health problems with having a stroke. Reports that she experiences pain and has not been able to maintain a full time job in the past year. Reports that she previously had two jobs a year ago and now works part time. Duration of problem: 1 year; Severity of problem: moderate  Objective: Mood: Depressed and Affect: Appropriate Risk of harm to self or others: No plan to harm self or others  Life Context: Family and Social: Pt has adult children who support her at times. Pt's significant other provides financial support. School/Work: Pt works part time.  Self-Care: Denies substance use. Reports watching television as coping skill.  Life Changes: Pt. had a stroke and has experienced physical health changes in the past year  Patient and/or Family's Strengths/Protective Factors: Concrete supports in place (healthy food, safe environments, etc.)  Goals Addressed: Patient will: Reduce symptoms of: anxiety and depression Increase knowledge and/or ability of: coping skills  Demonstrate ability to: Increase healthy adjustment to current life circumstances  Progress towards Goals: Ongoing  Interventions: Interventions utilized: Mindfulness or Teacher, early years/pre, CBT Cognitive Behavioral Therapy, and Supportive Counseling  Standardized Assessments completed: C-SSRS Short, GAD-7, and PHQ 9  Patient and/or Family Response: Pt receptive to tx. Pt receptive to psychoeducation provided on depression and anxiety. Pt receptive to cognitive restructuring and assistance with cognitive processing skills. Pt receptive to support provided. Pt will utilize deep breathing exercises and progressive muscle relaxation.  Patient Centered Plan: Patient is on the following Treatment Plan(s):  Stress  Assessment: Denies SI/HI. Stated she did not have her glasses on when completing screening. Denies auditory/visual hallucinations. Patient currently experiencing stress related to physical health. Pt has difficulty with not being able to work as much and is having difficulty adjusting to current circumstances.   Patient may benefit from community resources. LCSWA provided psychoeducation on depression and anxiety. LCSWA utilized cognitive restructuring and assisted with cognitive processing skills. LCSWA encouraged pt to utilize deep breathing exercises and progressive muscle relaxation. LCSWA encouraged pt to apply for CAFA and pt was provided with application. LCSWA will refer pt to servant center for application for disability.  Plan: Follow up with behavioral health clinician on : 02/17/21 Behavioral recommendations: Utilize deep breathing exercises and progressive muscle relaxation. Referral(s): Integrated [employer] (In Clinic) "From scale of 1-10, how likely are you to follow plan?": 10  Leila Schuff C Delfin Squillace, LCSW

## 2021-01-31 NOTE — Progress Notes (Signed)
Carelink Summary Report / Loop Recorder 

## 2021-02-17 ENCOUNTER — Ambulatory Visit: Payer: Self-pay | Admitting: Clinical

## 2021-02-24 ENCOUNTER — Ambulatory Visit (INDEPENDENT_AMBULATORY_CARE_PROVIDER_SITE_OTHER): Payer: Self-pay

## 2021-02-24 DIAGNOSIS — I639 Cerebral infarction, unspecified: Secondary | ICD-10-CM

## 2021-02-24 LAB — CUP PACEART REMOTE DEVICE CHECK
Date Time Interrogation Session: 20230116011038
Implantable Pulse Generator Implant Date: 20200302

## 2021-03-04 ENCOUNTER — Telehealth: Payer: Self-pay

## 2021-03-04 NOTE — Telephone Encounter (Signed)
LINQ alert received.  No data for review  Pt implanted for Crytpogenic stroke, no issues fopund since implant 04/11/2018.    Device showing 2 new AF episodes with no data. Need manual trasnmission for strips.    Spoke with pt daughter Alizzon (DPR on file).  She will assist mother with sending transmission.  Semt email to daughter with instructions for transmission.    Daughter does report that patient has been ill with a cough in recent days.

## 2021-03-05 NOTE — Progress Notes (Signed)
Carelink Summary Report / Loop Recorder 

## 2021-03-05 NOTE — Telephone Encounter (Signed)
Transmission to follow up on 2 AF events has not been received to date. Successful telephone encounter to patient's daughter Arieon who states she went to patient's home yesterday and assisted with sending manual transmission. Confirmed "green check mark" at end of process. Unsure why transmission has not been received. Daughter will call device clinic for assistance tomorrow when she arrives to her mother home. Provided contact [phone].

## 2021-03-10 NOTE — Telephone Encounter (Signed)
Transmission received. AF event clearly shows normal inverted P waves for linq 1.

## 2021-03-11 ENCOUNTER — Other Ambulatory Visit: Payer: Self-pay | Admitting: Obstetrics and Gynecology

## 2021-03-11 DIAGNOSIS — Z1231 Encounter for screening mammogram for malignant neoplasm of breast: Secondary | ICD-10-CM

## 2021-03-11 NOTE — Telephone Encounter (Signed)
The patient received her new handheld. Manuel Transmission received. I told her if everything looks good she will not receive a phone call. If the nurse see something she will get a phone call.

## 2021-03-31 ENCOUNTER — Ambulatory Visit (INDEPENDENT_AMBULATORY_CARE_PROVIDER_SITE_OTHER): Payer: Self-pay

## 2021-03-31 DIAGNOSIS — I639 Cerebral infarction, unspecified: Secondary | ICD-10-CM

## 2021-03-31 LAB — CUP PACEART REMOTE DEVICE CHECK
Date Time Interrogation Session: 20230219230801
Implantable Pulse Generator Implant Date: 20200302

## 2021-04-03 ENCOUNTER — Ambulatory Visit: Payer: Self-pay

## 2021-04-03 ENCOUNTER — Other Ambulatory Visit: Payer: Self-pay

## 2021-04-03 ENCOUNTER — Ambulatory Visit: Payer: Self-pay | Admitting: *Deleted

## 2021-04-03 VITALS — BP 140/80 | Wt 188.9 lb

## 2021-04-03 DIAGNOSIS — Z1239 Encounter for other screening for malignant neoplasm of breast: Secondary | ICD-10-CM

## 2021-04-03 DIAGNOSIS — N644 Mastodynia: Secondary | ICD-10-CM

## 2021-04-03 DIAGNOSIS — Z1211 Encounter for screening for malignant neoplasm of colon: Secondary | ICD-10-CM

## 2021-04-03 NOTE — Patient Instructions (Signed)
Explained breast self awareness with Tivis Ringer. Patient did not need a Pap smear today due to patient has a history of a hysterectomy for benign reasons. Let patient know that she doesn't need any further Pap smears due to her history of a hysterectomy for benign reasons. Referred patient to the [facility] for a diagnostic mammogram. Appointment scheduled Tuesday, April 15, 2021 at 1400. Patient aware of appointment and will be there. April Fry verbalized understanding.  Tonna Palazzi, Kathaleen Maser, RN 9:51 AM

## 2021-04-03 NOTE — Progress Notes (Signed)
Carelink Summary Report / Loop Recorder 

## 2021-04-03 NOTE — Progress Notes (Signed)
Ms. April Fry is a 61 y.o. female who presents to [facility] clinic today with complaint of left upper breast pain x one year that comes and goes. Patient rates the pain at a 4-5 out of 10.    Pap Smear: Pap smear not completed today. Last Pap smear was in 2008 at a clinic in [place] and was normal per patient. Per patient has no history of an abnormal Pap smear. Patient has a history of a hysterectomy 28 years ago due to AUB. Patient doesn't need any further Pap smears due to her history of a hysterectomy for benign reasons per BCCCP and ASCCP guidelines. Last Pap smear result is not available in Epic.   Physical exam: Breasts Breasts symmetrical. No skin abnormalities bilateral breasts. No nipple retraction bilateral breasts. No nipple discharge bilateral breasts. No lymphadenopathy. No lumps palpated bilateral breasts. Complaints of left upper breast pain on exam.       Pelvic/Bimanual Pap is not indicated today per BCCCP guidelines.    Smoking History: Patient has never smoked.   Patient Navigation: Patient education provided. Access to services provided for patient through [place] program. Spanish interpreter Natale Lay from [facility] provided.   Colorectal Cancer Screening: Per patient has never had colonoscopy completed. FIT Test given to patient to complete. No complaints today.    Breast and Cervical Cancer Risk Assessment: Patient has family history of her sister having breast cancer. Patient has no known genetic mutations or history of radiation treatment to the chest before age 89. Patient does not have history of cervical dysplasia, immunocompromised, or DES exposure in-utero.  Risk Assessment     Risk Scores       04/03/2021   Last edited by: Narda Rutherford, LPN   5-year risk: 1.8 %   Lifetime risk: 9 %            A: BCCCP exam without pap smear Complaint of left upper breast pain.  P: Referred patient to the [facility] for a diagnostic  mammogram. Appointment scheduled Tuesday, April 15, 2021 at 1400.  Priscille Heidelberg, RN 04/03/2021 9:51 AM

## 2021-04-09 LAB — FECAL OCCULT BLOOD, IMMUNOCHEMICAL: Fecal Occult Bld: NEGATIVE

## 2021-04-14 ENCOUNTER — Telehealth: Payer: Self-pay

## 2021-04-14 NOTE — Telephone Encounter (Signed)
Via Julie Sowell, Spanish Interpreter ([place]), Patient informed negative FIT Test results. Patient verbalized understanding.  °

## 2021-04-15 ENCOUNTER — Ambulatory Visit
Admission: RE | Admit: 2021-04-15 | Discharge: 2021-04-15 | Disposition: A | Discharge status: Home / Self Care | Payer: No Typology Code available for payment source | Source: Ambulatory Visit | Attending: Obstetrics and Gynecology | Admitting: Obstetrics and Gynecology

## 2021-04-15 ENCOUNTER — Ambulatory Visit
Admission: RE | Admit: 2021-04-15 | Discharge: 2021-04-15 | Disposition: A | Discharge status: Home / Self Care | Payer: Self-pay | Source: Ambulatory Visit | Attending: Obstetrics and Gynecology | Admitting: Obstetrics and Gynecology

## 2021-04-15 ENCOUNTER — Other Ambulatory Visit: Payer: Self-pay

## 2021-04-15 DIAGNOSIS — N644 Mastodynia: Secondary | ICD-10-CM

## 2021-04-23 ENCOUNTER — Other Ambulatory Visit: Payer: Self-pay

## 2021-05-05 ENCOUNTER — Ambulatory Visit (INDEPENDENT_AMBULATORY_CARE_PROVIDER_SITE_OTHER): Payer: Self-pay

## 2021-05-05 DIAGNOSIS — I639 Cerebral infarction, unspecified: Secondary | ICD-10-CM

## 2021-05-06 ENCOUNTER — Other Ambulatory Visit: Payer: Self-pay

## 2021-05-06 LAB — CUP PACEART REMOTE DEVICE CHECK
Date Time Interrogation Session: 20230326231316
Implantable Pulse Generator Implant Date: 20200302

## 2021-05-14 NOTE — Progress Notes (Signed)
Carelink Summary Report / Loop Recorder 

## 2021-05-15 ENCOUNTER — Emergency Department (HOSPITAL_COMMUNITY): Payer: No Typology Code available for payment source

## 2021-05-15 ENCOUNTER — Other Ambulatory Visit: Payer: Self-pay

## 2021-05-15 ENCOUNTER — Emergency Department (HOSPITAL_COMMUNITY)
Admission: EM | Admit: 2021-05-15 | Discharge: 2021-05-16 | Disposition: A | Discharge status: Home / Self Care | Payer: No Typology Code available for payment source | Attending: Emergency Medicine | Admitting: Emergency Medicine

## 2021-05-15 DIAGNOSIS — Z79899 Other long term (current) drug therapy: Secondary | ICD-10-CM | POA: Insufficient documentation

## 2021-05-15 DIAGNOSIS — R930 Abnormal findings on diagnostic imaging of skull and head, not elsewhere classified: Secondary | ICD-10-CM | POA: Diagnosis not present

## 2021-05-15 DIAGNOSIS — M542 Cervicalgia: Secondary | ICD-10-CM | POA: Diagnosis not present

## 2021-05-15 DIAGNOSIS — S60222A Contusion of left hand, initial encounter: Secondary | ICD-10-CM | POA: Diagnosis not present

## 2021-05-15 DIAGNOSIS — S30811A Abrasion of abdominal wall, initial encounter: Secondary | ICD-10-CM

## 2021-05-15 DIAGNOSIS — Y92410 Unspecified street and highway as the place of occurrence of the external cause: Secondary | ICD-10-CM | POA: Insufficient documentation

## 2021-05-15 DIAGNOSIS — Z7984 Long term (current) use of oral hypoglycemic drugs: Secondary | ICD-10-CM | POA: Diagnosis not present

## 2021-05-15 DIAGNOSIS — I10 Essential (primary) hypertension: Secondary | ICD-10-CM | POA: Insufficient documentation

## 2021-05-15 DIAGNOSIS — R1010 Upper abdominal pain, unspecified: Secondary | ICD-10-CM | POA: Diagnosis not present

## 2021-05-15 DIAGNOSIS — Z794 Long term (current) use of insulin: Secondary | ICD-10-CM | POA: Diagnosis not present

## 2021-05-15 DIAGNOSIS — S6992XA Unspecified injury of left wrist, hand and finger(s), initial encounter: Secondary | ICD-10-CM | POA: Diagnosis present

## 2021-05-15 LAB — I-STAT CHEM 8, ED
BUN: 11 mg/dL (ref 8–23)
Calcium, Ion: 1.19 mmol/L (ref 1.15–1.40)
Chloride: 102 mmol/L (ref 98–111)
Creatinine, Ser: 0.3 mg/dL — ABNORMAL LOW (ref 0.44–1.00)
Glucose, Bld: 229 mg/dL — ABNORMAL HIGH (ref 70–99)
HCT: 38 % (ref 36.0–46.0)
Hemoglobin: 12.9 g/dL (ref 12.0–15.0)
Potassium: 4.1 mmol/L (ref 3.5–5.1)
Sodium: 138 mmol/L (ref 135–145)
TCO2: 25 mmol/L (ref 22–32)

## 2021-05-15 MED ORDER — FENTANYL CITRATE PF 50 MCG/ML IJ SOSY
12.5000 ug | PREFILLED_SYRINGE | Freq: Once | INTRAMUSCULAR | Status: AC
  Administered 2021-05-15: 12.5 ug via INTRAVENOUS
  Filled 2021-05-15: qty 1

## 2021-05-15 MED ORDER — SODIUM CHLORIDE 0.9 % IV BOLUS
250.0000 mL | Freq: Once | INTRAVENOUS | Status: AC
  Administered 2021-05-15: 250 mL via INTRAVENOUS

## 2021-05-15 MED ORDER — IOHEXOL 300 MG/ML  SOLN
100.0000 mL | Freq: Once | INTRAMUSCULAR | Status: AC | PRN
  Administered 2021-05-15: 100 mL via INTRAVENOUS
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
## 2021-05-15 NOTE — Discharge Instructions (Signed)
Please use tylenol and ibuprofen as needed for pain ?Take your home blood pressure medications and have your blood pressure rechecked at your doctor's office asap ?
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
## 2021-05-16 ENCOUNTER — Other Ambulatory Visit: Payer: Self-pay

## 2021-05-16 MED ORDER — CYCLOBENZAPRINE HCL 10 MG PO TABS
10.0000 mg | ORAL_TABLET | Freq: Two times a day (BID) | ORAL | 0 refills | Status: DC | PRN
  Filled 2021-05-16: qty 20, 10d supply, fill #0

## 2021-05-16 MED ORDER — IBUPROFEN 600 MG PO TABS
600.0000 mg | ORAL_TABLET | Freq: Four times a day (QID) | ORAL | 0 refills | Status: DC | PRN
  Filled 2021-05-16: qty 30, 8d supply, fill #0

## 2021-05-19 ENCOUNTER — Other Ambulatory Visit: Payer: Self-pay

## 2021-05-19 ENCOUNTER — Other Ambulatory Visit: Payer: Self-pay | Admitting: Internal Medicine

## 2021-05-19 DIAGNOSIS — Z794 Long term (current) use of insulin: Secondary | ICD-10-CM

## 2021-05-20 NOTE — Telephone Encounter (Signed)
Requested medication (s) are due for refill today - expired Rx ? ?Requested medication (s) are on the active medication list -no ? ?Future visit scheduled -no ? ?Last refill: 1 year ? ?Notes to clinic: Request RF: Rx no longer on active medication list, patient needs appointment ? ?Requested Prescriptions  ?Pending Prescriptions Disp Refills  ? Insulin Syringe-Needle U-100 (TRUEPLUS INSULIN SYRINGE) 31G X 5/16" 0.3 ML MISC 100 each 11  ?  Sig: USE TO INJECT INSULIN DAILY.  ?  ? There is no refill protocol information for this order  ?  ? ? ? ?Requested Prescriptions  ?Pending Prescriptions Disp Refills  ? Insulin Syringe-Needle U-100 (TRUEPLUS INSULIN SYRINGE) 31G X 5/16" 0.3 ML MISC 100 each 11  ?  Sig: USE TO INJECT INSULIN DAILY.  ?  ? There is no refill protocol information for this order  ?  ? ? ? ?

## 2021-05-21 ENCOUNTER — Other Ambulatory Visit: Payer: Self-pay

## 2021-05-21 MED ORDER — "INSULIN SYRINGE-NEEDLE U-100 31G X 5/16"" 0.3 ML MISC"
0 refills | Status: DC
  Filled 2021-05-21: qty 100, 25d supply, fill #0
  Filled 2021-07-21: qty 100, 100d supply, fill #0

## 2021-05-28 ENCOUNTER — Other Ambulatory Visit: Payer: Self-pay

## 2021-06-07 LAB — CUP PACEART REMOTE DEVICE CHECK
Date Time Interrogation Session: 20230428231355
Implantable Pulse Generator Implant Date: 20200302

## 2021-06-09 ENCOUNTER — Ambulatory Visit (INDEPENDENT_AMBULATORY_CARE_PROVIDER_SITE_OTHER): Payer: Self-pay

## 2021-06-09 DIAGNOSIS — I639 Cerebral infarction, unspecified: Secondary | ICD-10-CM

## 2021-06-23 NOTE — Progress Notes (Signed)
Carelink Summary Report / Loop Recorder 

## 2021-07-13 LAB — CUP PACEART REMOTE DEVICE CHECK
Date Time Interrogation Session: 20230531232540
Implantable Pulse Generator Implant Date: 20200302

## 2021-07-14 ENCOUNTER — Ambulatory Visit (INDEPENDENT_AMBULATORY_CARE_PROVIDER_SITE_OTHER): Payer: Self-pay

## 2021-07-14 DIAGNOSIS — I639 Cerebral infarction, unspecified: Secondary | ICD-10-CM

## 2021-07-15 ENCOUNTER — Telehealth: Payer: Self-pay

## 2021-07-15 NOTE — Telephone Encounter (Signed)
LINQ alert received for AF, no EGM for review Route to triage.  Used Interpreter line [phone] Name: April Fry Int #: [phone]  No answer, LMTCB.  Need manual transmission to review EGM's.

## 2021-07-16 NOTE — Telephone Encounter (Signed)
Spoke with both daughters of patient, April Fry stated she would send the transmission tomorrow when she sent to see her mother. Instructions given to [facility] on how to send transmission

## 2021-07-17 NOTE — Telephone Encounter (Signed)
Transmission received. EGM's reviewed and p waves are noted. Continue to monitor.

## 2021-07-21 ENCOUNTER — Other Ambulatory Visit: Payer: Self-pay

## 2021-07-30 NOTE — Progress Notes (Signed)
Carelink Summary Report / Loop Recorder 

## 2021-08-17 LAB — CUP PACEART REMOTE DEVICE CHECK
Date Time Interrogation Session: 20230703232620
Implantable Pulse Generator Implant Date: 20200302

## 2021-08-18 ENCOUNTER — Ambulatory Visit (INDEPENDENT_AMBULATORY_CARE_PROVIDER_SITE_OTHER): Payer: Self-pay

## 2021-08-18 DIAGNOSIS — I639 Cerebral infarction, unspecified: Secondary | ICD-10-CM

## 2021-09-17 LAB — CUP PACEART REMOTE DEVICE CHECK
Date Time Interrogation Session: 20230805232926
Implantable Pulse Generator Implant Date: 20200302

## 2021-09-18 NOTE — Progress Notes (Signed)
Carelink Summary Report / Loop Recorder 

## 2021-09-22 ENCOUNTER — Ambulatory Visit (INDEPENDENT_AMBULATORY_CARE_PROVIDER_SITE_OTHER): Payer: Self-pay

## 2021-09-22 DIAGNOSIS — I639 Cerebral infarction, unspecified: Secondary | ICD-10-CM

## 2021-09-29 ENCOUNTER — Telehealth: Payer: Self-pay

## 2021-09-29 NOTE — Telephone Encounter (Signed)
ILR @ [facility] 09/25/21.   [employer] [phone] Interpreter [id number]  Attempted to call patient to advise via interpreter. No answer, LMTCB.   -Marked "I" in [employer] -Discontinued from [facility] -Canceled remotes

## 2021-10-01 NOTE — Telephone Encounter (Signed)
Spoke with patients daughter informed her of loop recorder no longer active, patients daughter stated she would speak with patient tonight regarding options of leaving loop recorder in or having it taken out.

## 2021-10-23 NOTE — Progress Notes (Signed)
Carelink Summary Report / Loop Recorder 

## 2021-11-05 ENCOUNTER — Other Ambulatory Visit: Payer: Self-pay

## 2021-11-21 ENCOUNTER — Other Ambulatory Visit: Payer: Self-pay | Admitting: Internal Medicine

## 2021-11-21 ENCOUNTER — Other Ambulatory Visit: Payer: Self-pay

## 2021-11-21 ENCOUNTER — Other Ambulatory Visit: Payer: Self-pay | Admitting: Pharmacist

## 2021-11-21 DIAGNOSIS — Z794 Long term (current) use of insulin: Secondary | ICD-10-CM

## 2021-11-21 DIAGNOSIS — E1169 Type 2 diabetes mellitus with other specified complication: Secondary | ICD-10-CM

## 2021-11-21 DIAGNOSIS — E1159 Type 2 diabetes mellitus with other circulatory complications: Secondary | ICD-10-CM

## 2021-11-21 MED ORDER — INSULIN GLARGINE 100 UNIT/ML ~~LOC~~ SOLN
17.0000 [IU] | Freq: Every day | SUBCUTANEOUS | 5 refills | Status: DC
  Filled 2021-11-21: qty 40, 112d supply, fill #0

## 2021-11-21 MED ORDER — "INSULIN SYRINGE-NEEDLE U-100 31G X 5/16"" 0.3 ML MISC"
0 refills | Status: DC
  Filled 2021-11-21: qty 100, 25d supply, fill #0

## 2022-01-08 ENCOUNTER — Ambulatory Visit: Payer: No Typology Code available for payment source | Admitting: Internal Medicine

## 2022-01-16 ENCOUNTER — Other Ambulatory Visit: Payer: Self-pay

## 2022-02-27 ENCOUNTER — Other Ambulatory Visit: Payer: Self-pay | Admitting: Pharmacist

## 2022-02-27 ENCOUNTER — Other Ambulatory Visit: Payer: Self-pay

## 2022-02-27 ENCOUNTER — Encounter: Payer: Self-pay | Admitting: Internal Medicine

## 2022-02-27 ENCOUNTER — Ambulatory Visit: Payer: Self-pay | Attending: Internal Medicine | Admitting: Internal Medicine

## 2022-02-27 VITALS — BP 179/69 | HR 71 | Temp 97.4°F | Ht 59.0 in | Wt 182.0 lb

## 2022-02-27 DIAGNOSIS — E1142 Type 2 diabetes mellitus with diabetic polyneuropathy: Secondary | ICD-10-CM

## 2022-02-27 DIAGNOSIS — E669 Obesity, unspecified: Secondary | ICD-10-CM

## 2022-02-27 DIAGNOSIS — E1159 Type 2 diabetes mellitus with other circulatory complications: Secondary | ICD-10-CM

## 2022-02-27 DIAGNOSIS — Z23 Encounter for immunization: Secondary | ICD-10-CM

## 2022-02-27 DIAGNOSIS — E1169 Type 2 diabetes mellitus with other specified complication: Secondary | ICD-10-CM

## 2022-02-27 DIAGNOSIS — E785 Hyperlipidemia, unspecified: Secondary | ICD-10-CM

## 2022-02-27 DIAGNOSIS — Z8673 Personal history of transient ischemic attack (TIA), and cerebral infarction without residual deficits: Secondary | ICD-10-CM

## 2022-02-27 DIAGNOSIS — I152 Hypertension secondary to endocrine disorders: Secondary | ICD-10-CM

## 2022-02-27 LAB — GLUCOSE, POCT (MANUAL RESULT ENTRY): POC Glucose: 399 mg/dL — AB (ref 70–99)

## 2022-02-27 LAB — POCT GLYCOSYLATED HEMOGLOBIN (HGB A1C): HbA1c, POC (controlled diabetic range): 10.1 % — AB (ref 0.0–7.0)

## 2022-02-27 MED ORDER — ASPIRIN 81 MG PO TBEC: 81.0000 mg | DELAYED_RELEASE_TABLET | Freq: Every day | ORAL | 1 refills | Status: AC

## 2022-02-27 MED ORDER — INSULIN ASPART 100 UNIT/ML IJ SOLN: 10.0000 [IU] | Freq: Once | INTRAMUSCULAR | Status: DC

## 2022-02-27 MED ORDER — PREGABALIN 50 MG PO CAPS
50.0000 mg | ORAL_CAPSULE | Freq: Two times a day (BID) | ORAL | 4 refills | Status: AC
  Filled 2022-02-27: qty 60, 30d supply, fill #0

## 2022-02-27 MED ORDER — ZOSTER VAC RECOMB ADJUVANTED 50 MCG/0.5ML IM SUSR: 0.5000 mL | Freq: Once | INTRAMUSCULAR | 0 refills | Status: AC

## 2022-02-27 MED ORDER — INSULIN GLARGINE 100 UNIT/ML ~~LOC~~ SOLN
17.0000 [IU] | Freq: Every day | SUBCUTANEOUS | 5 refills | Status: DC
  Filled 2022-02-27: qty 40, 112d supply, fill #0
  Filled 2022-07-14: qty 20, 56d supply, fill #1

## 2022-02-27 MED ORDER — AMLODIPINE BESYLATE 10 MG PO TABS
10.0000 mg | ORAL_TABLET | Freq: Every day | ORAL | 3 refills | Status: DC
  Filled 2022-02-27: qty 90, 90d supply, fill #0
  Filled 2022-07-14: qty 90, 90d supply, fill #1
  Filled 2022-11-10: qty 90, 90d supply, fill #2

## 2022-02-27 MED ORDER — LOSARTAN POTASSIUM 100 MG PO TABS
100.0000 mg | ORAL_TABLET | Freq: Every day | ORAL | 3 refills | Status: DC
  Filled 2022-02-27: qty 90, 90d supply, fill #0

## 2022-02-27 MED ORDER — GLIPIZIDE ER 5 MG PO TB24
5.0000 mg | ORAL_TABLET | Freq: Every day | ORAL | 3 refills | Status: DC
  Filled 2022-02-27: qty 90, 90d supply, fill #0
  Filled 2022-11-10: qty 90, 90d supply, fill #1

## 2022-02-27 MED ORDER — "INSULIN SYRINGE-NEEDLE U-100 31G X 5/16"" 0.3 ML MISC"
6 refills | Status: DC
  Filled 2022-02-27: qty 100, 100d supply, fill #0
  Filled 2022-07-14: qty 100, 100d supply, fill #1
  Filled 2022-11-10: qty 100, 100d supply, fill #2

## 2022-02-27 MED ORDER — ROSUVASTATIN CALCIUM 20 MG PO TABS
20.0000 mg | ORAL_TABLET | Freq: Every day | ORAL | 3 refills | Status: DC
  Filled 2022-02-27: qty 90, 90d supply, fill #0

## 2022-02-27 MED ORDER — METFORMIN HCL 1000 MG PO TABS
1000.0000 mg | ORAL_TABLET | Freq: Two times a day (BID) | ORAL | 3 refills | Status: DC
  Filled 2022-02-27: qty 180, 90d supply, fill #0
  Filled 2022-07-14: qty 180, 90d supply, fill #1
  Filled 2022-11-10: qty 180, 90d supply, fill #2

## 2022-02-27 NOTE — Patient Instructions (Signed)
Alimentacin saludable Healthy Eating Seguir una modalidad de alimentacin saludable puede ayudarlo a alcanzar y mantener un peso saludable, reducir el riesgo de tener enfermedades crnicas y vivir una vida larga y productiva. Es importante que siga una modalidad de alimentacin saludable con un nivel adecuado de caloras para su cuerpo. Debe cubrir sus necesidades nutricionales principalmente a travs de los alimentos, escogiendo una variedad de alimentos ricos en nutrientes. Cules son algunos consejos para seguir este plan? Lea las etiquetas de los alimentos Lea las etiquetas y elija las que digan lo siguiente: Reducido en sodio o con bajo contenido de sodio. Jugos con 100 % jugo de fruta. Alimentos con bajo contenido de grasas saturadas y alto contenido de grasas poliinsaturadas y monoinsaturadas. Alimentos con cereales integrales, como trigo integral, trigo partido, arroz integral y arroz salvaje. Cereales integrales fortificados con cido flico. Se recomienda a las mujeres embarazadas o que desean quedar embarazadas. Lea las etiquetas y evite: Los alimentos con una gran cantidad de azcares agregados. Estos incluyen los alimentos que contienen azcar moreno, endulzante a base de maz, jarabe de maz, dextrosa, fructosa, glucosa, jarabe de maz de alta fructosa, miel, azcar invertido, lactosa, jarabe de malta, maltosa, melaza, azcar sin refinar, sacarosa, trehalosa y azcar turbinado. No consuma ms que las siguientes cantidades de azcar agregada por da: 6 cucharaditas (25 g) las mujeres. 9 cucharaditas (38 g) los hombres. Los alimentos que contienen almidones y cereales refinados o procesados. Los productos de cereales refinados, como harina blanca, harina de maz desgerminada, pan blanco y arroz blanco. Al ir de compras Elija refrigerios ricos en nutrientes, como verduras, frutas enteras y frutos secos. Evite los refrigerios con alto contenido de caloras y azcar, como las papas  fritas, los refrigerios frutales y los caramelos. Use alios y productos para untar a base de aceite con los alimentos en lugar de grasas slidas como la mantequilla, la margarina en barra o el queso crema. Limite las salsas, las mezclas y los productos "instantneos" preelaborados como el arroz saborizado, los fideos instantneos y las pastas listas para comer. Pruebe ms fuentes de protena vegetal, como tofu, tempeh, frijoles negros, edamame, lentejas, frutos secos y semillas. Explore planes de alimentacin como la dieta mediterrnea o la dieta vegetariana. Al cocinar Use aceite para saltear los alimentos en lugar de grasas slidas como mantequilla, margarina en barra o manteca de cerdo. En lugar de frer, trate de cocinar en el horno, en la plancha o en la parrilla, o hervir los alimentos. Retire la parte grasa de las carnes antes de cocinarlas. Cocine las verduras al vapor en agua o caldo. Planificacin de las comidas  En las comidas, imagine dividir su plato en cuartos: La mitad del plato tiene frutas y verduras. Un cuarto del plato tiene cereales integrales. Un cuarto del plato tiene protena, especialmente carnes magras, aves, huevos, tofu, frijoles o frutos secos. Incluya lcteos descremados en su dieta diaria. Estilo de vida Elija opciones saludables en todos los mbitos, como en el hogar, el trabajo, la escuela, los restaurantes y las tiendas. Prepare los alimentos de un modo seguro: Lvese las manos despus de manipular carnes crudas. Mantenga las superficies de preparacin de los alimentos limpias lavndolas regularmente con agua caliente y jabn. Mantenga las carnes crudas separadas de los alimentos que estn listos para comer como las frutas y las verduras. Cocine los frutos de mar, carnes, aves y huevos hasta alcanzar la temperatura interna recomendada. Almacene los alimentos a temperaturas seguras. En general: Mantenga los alimentos fros a una temperatura de 40   F (4,4 C)  o inferior. Mantenga los alimentos calientes a una temperatura de 140 F (60 C) o superior. Mantenga el congelador a una temperatura de 0 F (-17,8 C) o inferior. Los alimentos dejan de ser seguros para su consumo cuando han estado a una temperatura de entre 40 y 140 F (4,4 y 60 C) por ms de 2 horas. Qu alimentos debo consumir? Frutas Propngase comer el equivalente a 2 tazas de frutas frescas, enlatadas (en su jugo natural) o congeladas cada da. Algunos ejemplos de equivalentes a 1 taza de frutas son 1 manzana pequea, 8 fresas grandes, 1 taza de fruta enlatada,  taza de fruta desecada o 1 taza de jugo 100 %. Verduras Propngase comer el equivalente a 2 o 3 tazas de verduras frescas y congeladas cada da, incluyendo diferentes variedades y colores. Algunos ejemplos de equivalentes a 1 taza de verduras son 2 zanahorias medianas, 2 tazas de verduras de hoja verde crudas, 1 taza de verduras cortadas (crudas o cocidas) o 1 papa mediana al horno. Granos Propngase comer el equivalente a 6 onzas de cereales integrales por da. Algunos ejemplos de equivalentes a 1 onza de cereales son 1 rebanada de pan, 1 taza de cereal listo para comer, 3 tazas de palomitas de maz o  taza de arroz, pasta o cereales cocidos. Carnes y otras protenas Propngase comer el equivalente a 5 o 6 onzas de protena por da. Algunos ejemplos de equivalentes a 1 onza de protena son 1 huevo,  taza de frutos secos o semillas o 1 cucharada (16 g) de mantequilla de man. Un corte de carne o pescado del tamao de un mazo de cartas equivale aproximadamente a 3 a 4 onzas. De las protenas que consume cada semana, intente que al menos 8 onzas provengan de frutos de mar. Esto incluye el salmn, la trucha, el arenque y las anchoas. Lcteos Propngase comer el equivalente a 3 tazas de lcteos descremados o con bajo contenido de grasa cada da. Algunos ejemplos de equivalentes a 1 taza de lcteos son 1 taza (240 ml) de leche, 8  onzas (250 g) de yogur, 1 onzas (44 g) de queso natural o 1 taza (240 ml) de leche de soja fortificada. Grasas y aceites Propngase consumir alrededor de 5 cucharaditas (21 g) por da. Elija grasas monoinsaturadas, como el aceite de canola y de oliva, aguacate, mantequilla de man y la mayora de los frutos secos, o bien grasas poliinsaturadas, como el aceite de girasol, maz y soja, nueces, piones, semillas de ssamo, semillas de girasol y semillas de lino. Bebidas Propngase beber seis vasos de 8 onzas de agua por da. Limite el caf a entre tres y cinco tazas de 8 onzas por da. Limite el consumo de bebidas con cafena que tengan caloras agregadas, como los refrescos y las bebidas energizantes. Limite el consumo de alcohol a no ms de 1 medida por da si es mujer y no est embarazada, y a 2 medidas por da si es hombre. Una medida equivale a 12 onzas de cerveza (355 ml), 5 onzas de vino (148 ml) o 1 onzas de bebidas alcohlicas de alta graduacin (44 ml). Condimentos y otros alimentos Evite agregar cantidades excesivas de sal a los alimentos. Pruebe darles sabor con hierbas y especias en lugar de sal. Evite agregar azcar a los alimentos. Pruebe usar alios, salsas y productos untables a base de aceite en lugar de grasas slidas. Esta informacin se basa en las pautas generales de nutricin de los [place] Para obtener   ms informacin, visite choosemyplate.gov. Las cantidades exactas pueden variar en funcin de sus necesidades nutricionales. Resumen Un plan de alimentacin saludable puede ayudarlo a mantener un peso saludable, reducir el riesgo de tener enfermedades crnicas y mantenerse activo durante toda su vida. Planifique sus comidas. Asegrese de consumir las porciones correctas de una variedad de alimentos ricos en nutrientes. En lugar de frer, trate de cocinar en el horno, en la plancha o en la parrilla, o hervir los alimentos. Elija opciones saludables en todos los mbitos, como en el  hogar, el trabajo, la escuela, los restaurantes y las tiendas. Esta informacin no tiene como fin reemplazar el consejo del mdico. Asegrese de hacerle al mdico cualquier pregunta que tenga. Document Revised: 09/27/2020 Document Reviewed: 09/27/2020 Elsevier Patient Education  2023 Elsevier Inc.  

## 2022-02-27 NOTE — Progress Notes (Signed)
Patient ID: Mekisha Bittel, female    DOB: 04/01/1960  MRN: [id number]  CC: Diabetes (DM. Med refill. Ottis Stain on R & L toes, painful walking, blanching skin X2 mo./Pain on back (kindneys), painful breathing x2 mo./Yes to flu vax. )   Subjective: April Fry is a 62 y.o. female who presents for chronic ds management.  Last seen 01/2021 Her concerns today include:  Patient with history of DM type II, HTN, HL, LBBB, cryptogenic CVA, obesity   Pt did not bring meds with her DM: Results for orders placed or performed in visit on 02/27/22  POCT glucose (manual entry)  Result Value Ref Range   POC Glucose 399 (A) 70 - 99 mg/dl  POCT glycosylated hemoglobin (Hb A1C)  Result Value Ref Range   Hemoglobin A1C     HbA1c POC (<> result, manual entry)     HbA1c, POC (prediabetic range)     HbA1c, POC (controlled diabetic range) 10.1 (A) 0.0 - 7.0 %   Reports compliance with Lantus 17 units, Metformin 1 gram BID and Glipizide 5 mg daily.  Out Metformin  and Glipizide x 2 wks.  Does not check BS but does have device BP elevated today.  Out of Norvasc 10 mg, Cozaar 100 mg daily and Labetalol 200 mg BID x 2 mth.  No CP, SOB, LE edema, HA.  Does not check BP.  Limits salt in foods HL: not taking the Crestor every day.  Reports she has so many meds she forgets to take some.  She has history of stroke.  She is not on aspirin.  C/o strong pain in toes x 2 mths.  Constant.  On further questioning, she reports the pain is in both feet but worse in the toes.  Pain also in the lower extremities from the knees down.  Endorses numbness and tingling in both feet. Sometimes feet turn white and swollen.  Sometimes she can not walk  HM:  due for flu and shingrix Patient Active Problem List   Diagnosis Date Noted   Major depressive disorder, single episode, moderate (HCC) 01/20/2021   Cryptogenic stroke (HCC) 04/11/2018   Hyperglycemia    Hypokalemia    Hyperlipidemia LDL goal <70    Class 1  obesity due to excess calories with serious comorbidity and body mass index (BMI) of 33.0 to 33.9 in adult    Cerebellar stroke (HCC) 01/31/2018   Hyperlipidemia associated with type 2 diabetes mellitus (HCC) 06/17/2016   Diabetes mellitus (HCC) 05/21/2016   Essential hypertension 05/21/2016   LBBB (left bundle branch block) 05/21/2016     Current Outpatient Medications on File Prior to Visit  Medication Sig Dispense Refill   blood glucose meter kit and supplies KIT Dispense based on patient and insurance preference. Use up to four times daily as directed. (FOR ICD-9 250.00, 250.01). 1 each 0   Blood Glucose Monitoring Suppl (TRUE METRIX METER) w/Device KIT Use to check blood sugar in the morning. 1 kit 0   cyclobenzaprine (FLEXERIL) 10 MG tablet Take 1 tablet (10 mg total) by mouth 2 (two) times daily as needed for muscle spasms. 20 tablet 0   glucose blood (TRUE METRIX BLOOD GLUCOSE TEST) test strip Use as instructed 100 each 12   ibuprofen (ADVIL) 600 MG tablet Take 1 tablet (600 mg total) by mouth every 6 (six) hours as needed. 30 tablet 0   labetalol (NORMODYNE) 200 MG tablet Take 1 tablet (200 mg total) by mouth 2 (two) times daily. 180  tablet 2   TRUEplus Lancets 28G MISC Use to check blood sugar daily. 100 each 11   Current Facility-Administered Medications on File Prior to Visit  Medication Dose Route Frequency Provider Last Rate Last Admin   pneumococcal 13-valent conjugate vaccine (PREVNAR 13) injection 0.5 mL  0.5 mL Intramuscular Once Hairston, Oren Beckmann, FNP        No Known Allergies  Social History   Socioeconomic History   Marital status: Single    Spouse name: Not on file   Number of children: 5   Years of education: Not on file   Highest education level: 6th grade  Occupational History   Not on file  Tobacco Use   Smoking status: Never   Smokeless tobacco: Never  Vaping Use   Vaping Use: Never used  Substance and Sexual Activity   Alcohol use: Yes     Comment: on rare occasions.    Drug use: No   Sexual activity: Yes    Birth control/protection: Post-menopausal  Other Topics Concern   Not on file  Social History Narrative   Not on file   Social Determinants of Health   Financial Resource Strain: Not on file  Food Insecurity: No Food Insecurity (04/03/2021)   Hunger Vital Sign    Worried About Running Out of Food in the Last Year: Never true    Ran Out of Food in the Last Year: Never true  Transportation Needs: No Transportation Needs (04/03/2021)   PRAPARE - Transportation    Lack of Transportation (Medical): No    Lack of Transportation (Non-Medical): No  Physical Activity: Not on file  Stress: Not on file  Social Connections: Not on file  Intimate Partner Violence: Not on file    Family History  Problem Relation Age of Onset   Other Mother        parents passed away in an earthquake in 1980's   Heart attack Sister    Obesity Sister     Past Surgical History:  Procedure Laterality Date   ABDOMINAL HYSTERECTOMY     LOOP RECORDER INSERTION N/A 04/11/2018   Procedure: LOOP RECORDER INSERTION;  Surgeon: Marinus Maw, MD;  Location: [facility];  Service: Cardiovascular;  Laterality: N/A;   TEE WITHOUT CARDIOVERSION N/A 02/08/2018   Procedure: TRANSESOPHAGEAL ECHOCARDIOGRAM (TEE);  Surgeon: Jake Bathe, MD;  Location: [facility]OSCOPY;  Service: Cardiovascular;  Laterality: N/A;    ROS: Review of Systems Negative except as stated above  PHYSICAL EXAM: BP (!) 179/69 (BP Location: Left Arm, Patient Position: Sitting, Cuff Size: Large)   Pulse 71   Temp (!) 97.4 F (36.3 C) (Oral)   Ht 4\' 11"  (1.499 m)   Wt 182 lb (82.6 kg)   SpO2 96%   BMI 36.76 kg/m   Physical Exam Repeat blood pressure 180/80 General appearance - alert, well appearing, obese older Hispanic female and in no distress Mental status - normal mood, behavior, speech, dress, motor activity, and thought processes Neck - supple, no significant  adenopathy Chest - clear to auscultation, no wheezes, rales or rhonchi, symmetric air entry Heart - normal rate, regular rhythm, normal S1, S2, no murmurs, rubs, clicks or gallops Extremities - peripheral pulses normal, no pedal edema, no clubbing or cyanosis Diabetic Foot Exam - Simple   Simple Foot Form Diabetic Foot exam was performed with the following findings: Yes 02/27/2022  2:17 PM  Visual Inspection See comments: Yes Sensation Testing See comments: Yes Pulse Check Posterior Tibialis and  Dorsalis pulse intact bilaterally: Yes Comments Slight flat footed BL.  Callous on plantar LT 2-3rd tos and RT 2nd toe.  The fifth exam is abnormal with decreased sensation on the plantar surface of both feet.        02/27/2022    1:52 PM 01/20/2021   10:28 AM 05/20/2020    2:56 PM  Depression screen PHQ 2/9  Decreased Interest 0 2 0  Down, Depressed, Hopeless 0 2 0  PHQ - 2 Score 0 4 0  Altered sleeping 0 3   Tired, decreased energy 1    Change in appetite 1 2   Feeling bad or failure about yourself  0 3   Trouble concentrating 0 2   Moving slowly or fidgety/restless 0 3   Suicidal thoughts 0 3   PHQ-9 Score 2 20     Results for orders placed or performed in visit on 02/27/22  POCT glucose (manual entry)  Result Value Ref Range   POC Glucose 399 (A) 70 - 99 mg/dl  POCT glycosylated hemoglobin (Hb A1C)  Result Value Ref Range   Hemoglobin A1C     HbA1c POC (<> result, manual entry)     HbA1c, POC (prediabetic range)     HbA1c, POC (controlled diabetic range) 10.1 (A) 0.0 - 7.0 %        Latest Ref Rng & Units 05/15/2021   10:24 PM 01/20/2021   11:40 AM 04/15/2020   12:51 PM  CMP  Glucose 70 - 99 mg/dL 229  206  106   BUN 8 - 23 mg/dL 11  14  13    Creatinine 0.44 - 1.00 mg/dL 0.30  0.53  0.46   Sodium 135 - 145 mmol/L 138  140  140   Potassium 3.5 - 5.1 mmol/L 4.1  4.6  4.0   Chloride 98 - 111 mmol/L 102  102  107   CO2 20 - 29 mmol/L  23  21   Calcium 8.7 - 10.3 mg/dL   9.2  9.0   Total Protein 6.0 - 8.5 g/dL  7.1    Total Bilirubin 0.0 - 1.2 mg/dL  0.5    Alkaline Phos 44 - 121 IU/L  89    AST 0 - 40 IU/L  15    ALT 0 - 32 IU/L  19     Lipid Panel     Component Value Date/Time   CHOL 207 (H) 01/20/2021 1140   TRIG 87 01/20/2021 1140   HDL 46 01/20/2021 1140   CHOLHDL 4.5 (H) 01/20/2021 1140   CHOLHDL 3.2 02/01/2018 0011   VLDL 14 02/01/2018 0011   LDLCALC 145 (H) 01/20/2021 1140    CBC    Component Value Date/Time   WBC 6.1 01/20/2021 1140   WBC 8.8 04/15/2020 1251   RBC 4.66 01/20/2021 1140   RBC 4.66 04/15/2020 1251   HGB 12.9 05/15/2021 2224   HGB 13.0 01/20/2021 1140   HCT 38.0 05/15/2021 2224   HCT 40.0 01/20/2021 1140   PLT 269 01/20/2021 1140   MCV 86 01/20/2021 1140   MCH 27.9 01/20/2021 1140   MCH 27.9 04/15/2020 1251   MCHC 32.5 01/20/2021 1140   MCHC 32.8 04/15/2020 1251   RDW 13.2 01/20/2021 1140   LYMPHSABS 1.1 01/31/2018 1035   MONOABS 0.3 01/31/2018 1035   EOSABS 0.0 01/31/2018 1035   BASOSABS 0.0 01/31/2018 1035    ASSESSMENT AND PLAN:  1. Type 2 diabetes mellitus with peripheral neuropathy (HCC) Not at  goal.  She has been out of metformin and glipizide.  Refills sent.  Continue Lantus insulin 17 units daily.  Discussed on encourage healthy eating habits.  Encouraged to get an eye exam when she is able to afford. - POCT glucose (manual entry) - POCT glycosylated hemoglobin (Hb A1C) - POCT URINALYSIS DIP (CLINITEK) - insulin aspart (novoLOG) injection 10 Units - Microalbumin / creatinine urine ratio - CBC - Comprehensive metabolic panel - Lipid panel - glipiZIDE (GLUCOTROL XL) 5 MG 24 hr tablet; Take 1 tablet (5 mg total) by mouth daily with breakfast.  Dispense: 90 tablet; Refill: 3 - insulin glargine (LANTUS) 100 UNIT/ML injection; Inject 0.17 mLs (17 Units total) into the skin at bedtime.  Dispense: 10 mL; Refill: 5 - metFORMIN (GLUCOPHAGE) 1000 MG tablet; Take 1 tablet (1,000 mg total) by mouth 2 (two)  times daily with a meal.  Dispense: 180 tablet; Refill: 3 - pregabalin (LYRICA) 50 MG capsule; Take 1 capsule (50 mg total) by mouth 2 (two) times daily.  Dispense: 60 capsule; Refill: 4  2. Hypertension associated with diabetes (HCC) Not at goal.  She has been out of amlodipine, Cozaar and labetalol for over 2 months.  We will restart amlodipine and Cozaar for now.  Have her follow-up with me in 6 weeks.  DASH diet encouraged - amLODipine (NORVASC) 10 MG tablet; Take 1 tablet (10 mg total) by mouth daily. to lower blood pressure  Dispense: 90 tablet; Refill: 3 - losartan (COZAAR) 100 MG tablet; Take 1 tablet (100 mg total) by mouth daily.  Dispense: 90 tablet; Refill: 3  3. Hyperlipidemia associated with type 2 diabetes mellitus (HCC) - rosuvastatin (CRESTOR) 20 MG tablet; Take 1 tablet (20 mg total) by mouth daily.  Dispense: 90 tablet; Refill: 3  4. Need for shingles vaccine Patient agreeable to receiving shingles vaccine.  Prescription printed and given to her to take to the pharmacy. - Zoster Vaccine Adjuvanted [facility]) injection; Inject 0.5 mLs into the muscle once for 1 dose.  Dispense: 0.5 mL; Refill: 0  5. History of lacunar cerebrovascular accident (CVA) - aspirin EC 81 MG tablet; Take 1 tablet (81 mg total) by mouth daily. Swallow whole.  Dispense: 90 tablet; Refill: 1  6. Need for immunization against influenza - Flu Vaccine QUAD 70mo+IM (Fluarix, Fluzone & Alfiuria Quad PF)  [employer] Language interpreter used during this encounter. #[id number], Lorena  Patient was given the opportunity to ask questions.  Patient verbalized understanding of the plan and was able to repeat key elements of the plan.   This documentation was completed using Paediatric nurse.  Any transcriptional errors are unintentional.  Orders Placed This Encounter  Procedures   Flu Vaccine QUAD 63mo+IM (Fluarix, Fluzone & Alfiuria Quad PF)   Microalbumin / creatinine urine ratio   CBC    Comprehensive metabolic panel   Lipid panel   POCT glucose (manual entry)   POCT glycosylated hemoglobin (Hb A1C)   POCT URINALYSIS DIP (CLINITEK)     Requested Prescriptions   Signed Prescriptions Disp Refills   Zoster Vaccine Adjuvanted (SHINGRIX) injection 0.5 mL 0    Sig: Inject 0.5 mLs into the muscle once for 1 dose.   amLODipine (NORVASC) 10 MG tablet 90 tablet 3    Sig: Take 1 tablet (10 mg total) by mouth daily. to lower blood pressure   glipiZIDE (GLUCOTROL XL) 5 MG 24 hr tablet 90 tablet 3    Sig: Take 1 tablet (5 mg total) by mouth daily with breakfast.  insulin glargine (LANTUS) 100 UNIT/ML injection 10 mL 5    Sig: Inject 0.17 mLs (17 Units total) into the skin at bedtime.   losartan (COZAAR) 100 MG tablet 90 tablet 3    Sig: Take 1 tablet (100 mg total) by mouth daily.   metFORMIN (GLUCOPHAGE) 1000 MG tablet 180 tablet 3    Sig: Take 1 tablet (1,000 mg total) by mouth 2 (two) times daily with a meal.   rosuvastatin (CRESTOR) 20 MG tablet 90 tablet 3    Sig: Take 1 tablet (20 mg total) by mouth daily.   pregabalin (LYRICA) 50 MG capsule 60 capsule 4    Sig: Take 1 capsule (50 mg total) by mouth 2 (two) times daily.   aspirin EC 81 MG tablet 90 tablet 1    Sig: Take 1 tablet (81 mg total) by mouth daily. Swallow whole.    Return in about 7 weeks (around 04/17/2022).  Karle Plumber, MD, FACP

## 2022-02-28 LAB — COMPREHENSIVE METABOLIC PANEL
ALT: 16 IU/L (ref 0–32)
AST: 13 IU/L (ref 0–40)
Albumin/Globulin Ratio: 1.4 (ref 1.2–2.2)
Albumin: 4 g/dL (ref 3.9–4.9)
Alkaline Phosphatase: 94 IU/L (ref 44–121)
BUN/Creatinine Ratio: 19 (ref 12–28)
BUN: 10 mg/dL (ref 8–27)
Bilirubin Total: 0.4 mg/dL (ref 0.0–1.2)
CO2: 24 mmol/L (ref 20–29)
Calcium: 9.2 mg/dL (ref 8.7–10.3)
Chloride: 101 mmol/L (ref 96–106)
Creatinine, Ser: 0.54 mg/dL — ABNORMAL LOW (ref 0.57–1.00)
Globulin, Total: 2.9 g/dL (ref 1.5–4.5)
Glucose: 382 mg/dL — ABNORMAL HIGH (ref 70–99)
Potassium: 4.3 mmol/L (ref 3.5–5.2)
Sodium: 138 mmol/L (ref 134–144)
Total Protein: 6.9 g/dL (ref 6.0–8.5)
eGFR: 104 mL/min/{1.73_m2} (ref >59)

## 2022-02-28 LAB — CBC
Hematocrit: 41.4 % (ref 34.0–46.6)
Hemoglobin: 13.3 g/dL (ref 11.1–15.9)
MCH: 28.2 pg (ref 26.6–33.0)
MCHC: 32.1 g/dL (ref 31.5–35.7)
MCV: 88 fL (ref 79–97)
Platelets: 300 10*3/uL (ref 150–450)
RBC: 4.72 x10E6/uL (ref 3.77–5.28)
RDW: 13.1 % (ref 11.7–15.4)
WBC: 7.2 10*3/uL (ref 3.4–10.8)

## 2022-02-28 LAB — MICROALBUMIN / CREATININE URINE RATIO
Creatinine, Urine: 39.2 mg/dL
Microalb/Creat Ratio: 61 mg/g creat — ABNORMAL HIGH (ref 0–29)
Microalbumin, Urine: 23.8 ug/mL

## 2022-02-28 LAB — LIPID PANEL
Chol/HDL Ratio: 4.6 ratio — ABNORMAL HIGH (ref 0.0–4.4)
Cholesterol, Total: 224 mg/dL — ABNORMAL HIGH (ref 100–199)
HDL: 49 mg/dL (ref >39)
LDL Chol Calc (NIH): 144 mg/dL — ABNORMAL HIGH (ref 0–99)
Triglycerides: 172 mg/dL — ABNORMAL HIGH (ref 0–149)
VLDL Cholesterol Cal: 31 mg/dL (ref 5–40)

## 2022-03-02 ENCOUNTER — Other Ambulatory Visit: Payer: Self-pay

## 2022-03-02 LAB — POCT URINALYSIS DIP (CLINITEK)
Bilirubin, UA: NEGATIVE
Blood, UA: NEGATIVE
Glucose, UA: >=1000 mg/dL — AB
Ketones, POC UA: NEGATIVE mg/dL
Leukocytes, UA: NEGATIVE
Nitrite, UA: NEGATIVE
POC PROTEIN,UA: NEGATIVE
Spec Grav, UA: 1.015 (ref 1.010–1.025)
Urobilinogen, UA: 0.2 E.U./dL
pH, UA: 5.5 (ref 5.0–8.0)

## 2022-04-21 ENCOUNTER — Ambulatory Visit: Payer: Self-pay | Attending: Internal Medicine | Admitting: Internal Medicine

## 2022-04-27 ENCOUNTER — Other Ambulatory Visit: Payer: Self-pay

## 2022-05-01 ENCOUNTER — Other Ambulatory Visit: Payer: Self-pay

## 2022-05-18 ENCOUNTER — Other Ambulatory Visit: Payer: Self-pay

## 2022-05-20 ENCOUNTER — Other Ambulatory Visit: Payer: Self-pay

## 2022-07-14 ENCOUNTER — Other Ambulatory Visit: Payer: Self-pay

## 2022-11-10 ENCOUNTER — Other Ambulatory Visit: Payer: Self-pay | Admitting: Internal Medicine

## 2022-11-10 ENCOUNTER — Other Ambulatory Visit: Payer: Self-pay

## 2022-11-10 DIAGNOSIS — E1142 Type 2 diabetes mellitus with diabetic polyneuropathy: Secondary | ICD-10-CM

## 2022-11-10 DIAGNOSIS — E1159 Type 2 diabetes mellitus with other circulatory complications: Secondary | ICD-10-CM

## 2022-11-11 ENCOUNTER — Other Ambulatory Visit: Payer: Self-pay

## 2022-11-11 MED ORDER — LABETALOL HCL 200 MG PO TABS
200.0000 mg | ORAL_TABLET | Freq: Two times a day (BID) | ORAL | 0 refills | Status: DC
  Filled 2022-11-11: qty 60, 30d supply, fill #0

## 2022-11-11 NOTE — Telephone Encounter (Signed)
Requested medication (s) are due for refill today: yes  Requested medication (s) are on the active medication list: yes    Last refill: 02/27/22   10ml 5 refills  Future visit scheduled yes 12/08/22  Notes to clinic:  Failed due to labs, please review. Thank you.  Requested Prescriptions  Pending Prescriptions Disp Refills   insulin glargine (LANTUS) 100 UNIT/ML injection 10 mL 5    Sig: Inject 0.17 mLs (17 Units total) into the skin at bedtime.     Endocrinology:  Diabetes - Insulins Failed - 11/10/2022 12:37 PM      Failed - HBA1C is between 0 and 7.9 and within 180 days    HbA1c, POC (prediabetic range)  Date Value Ref Range Status  11/25/2017 10.3 (A) 5.7 - 6.4 % Final   HbA1c, POC (controlled diabetic range)  Date Value Ref Range Status  02/27/2022 10.1 (A) 0.0 - 7.0 % Final         Failed - Valid encounter within last 6 months    Recent Outpatient Visits           8 months ago Type 2 diabetes mellitus with peripheral neuropathy [facility])   [place] [facility] Jonah Blue B, MD   1 year ago Type 2 diabetes mellitus with obesity [facility])   [facility] Jonah Blue B, MD   2 years ago Type 2 diabetes mellitus with other circulatory complication, with long-term current use of insulin [facility])   [place] [facility] [place], Shea Stakes, NP   2 years ago Appointment canceled by hospital   [facility] Jonah Blue B, MD   3 years ago Type 2 diabetes mellitus with other circulatory complication, with long-term current use of insulin [facility])   [facility] [place], [place], [place]       Future Appointments             In 3 weeks Marcine Matar, MD [facility]            Signed Prescriptions Disp Refills   labetalol (NORMODYNE) 200 MG tablet 180 tablet 0    Sig: Take 1 tablet  (200 mg total) by mouth 2 (two) times daily.     Cardiovascular:  Beta Blockers Failed - 11/10/2022 12:37 PM      Failed - Last BP in normal range    BP Readings from Last 1 Encounters:  02/27/22 (!) 179/69         Failed - Valid encounter within last 6 months    Recent Outpatient Visits           8 months ago Type 2 diabetes mellitus with peripheral neuropathy [facility])   [facility] Jonah Blue B, MD   1 year ago Type 2 diabetes mellitus with obesity [facility])   [facility] Jonah Blue B, MD   2 years ago Type 2 diabetes mellitus with other circulatory complication, with long-term current use of insulin [facility])    [facility] [place], Shea Stakes, NP   2 years ago Appointment canceled by hospital   [facility] Jonah Blue B, MD   3 years ago Type 2 diabetes mellitus with other circulatory complication, with long-term current use of insulin (HCC)  [facility] [place], Marzella Schlein, [place]       Future Appointments             In 3 weeks Marcine Matar, MD [facility]            Passed - Last Heart Rate in normal range    Pulse Readings from Last 1 Encounters:  02/27/22 71

## 2022-11-11 NOTE — Telephone Encounter (Signed)
Requested Prescriptions  Pending Prescriptions Disp Refills   labetalol (NORMODYNE) 200 MG tablet 180 tablet 2    Sig: Take 1 tablet (200 mg total) by mouth 2 (two) times daily.     Cardiovascular:  Beta Blockers Failed - 11/10/2022 12:37 PM      Failed - Last BP in normal range    BP Readings from Last 1 Encounters:  02/27/22 (!) 179/69         Failed - Valid encounter within last 6 months    Recent Outpatient Visits           8 months ago Type 2 diabetes mellitus with peripheral neuropathy [facility])   [place] [facility] Jonah Blue B, MD   1 year ago Type 2 diabetes mellitus with obesity [facility])   [facility] Jonah Blue B, MD   2 years ago Type 2 diabetes mellitus with other circulatory complication, with long-term current use of insulin [facility])   [place] [facility] [place], Shea Stakes, NP   2 years ago Appointment canceled by hospital   [facility] Jonah Blue B, MD   3 years ago Type 2 diabetes mellitus with other circulatory complication, with long-term current use of insulin [facility])   [place] [facility] [place], [place], [place]       Future Appointments             In 3 weeks Marcine Matar, MD [facility]            Passed - Last Heart Rate in normal range    Pulse Readings from Last 1 Encounters:  02/27/22 71          insulin glargine (LANTUS) 100 UNIT/ML injection 10 mL 5    Sig: Inject 0.17 mLs (17 Units total) into the skin at bedtime.     Endocrinology:  Diabetes - Insulins Failed - 11/10/2022 12:37 PM      Failed - HBA1C is between 0 and 7.9 and within 180 days    HbA1c, POC (prediabetic range)  Date Value Ref Range Status  11/25/2017 10.3 (A) 5.7 - 6.4 % Final   HbA1c, POC (controlled diabetic range)  Date Value Ref Range Status  02/27/2022 10.1 (A) 0.0 -  7.0 % Final         Failed - Valid encounter within last 6 months    Recent Outpatient Visits           8 months ago Type 2 diabetes mellitus with peripheral neuropathy [facility])   [place] [facility] Jonah Blue B, MD   1 year ago Type 2 diabetes mellitus with obesity [facility])   [facility] Jon[facility] Blue B, MD   2 years ago Type 2 diabetes mellitus with other circulatory complication, with long-term current use of insulin [facility])   [facility] [place], Shea Stakes, NP   2 years ago Appointment canceled by hospital   [facility] Jonah Blue B, MD   3 years ago Type 2 diabetes mellitus with other circulatory complication, with long-term current use of insulin [facility])   [place] [place], Marzella [place], [place]       Future Appointments  In 3 weeks Marcine Matar, MD [facility]

## 2022-11-12 ENCOUNTER — Other Ambulatory Visit: Payer: Self-pay

## 2022-11-12 MED ORDER — INSULIN GLARGINE 100 UNIT/ML ~~LOC~~ SOLN
17.0000 [IU] | Freq: Every day | SUBCUTANEOUS | 0 refills | Status: DC
  Filled 2022-11-12 – 2023-06-08 (×2): qty 10, 28d supply, fill #0

## 2022-11-23 ENCOUNTER — Other Ambulatory Visit: Payer: Self-pay

## 2022-11-24 ENCOUNTER — Other Ambulatory Visit: Payer: Self-pay

## 2022-12-08 ENCOUNTER — Ambulatory Visit: Payer: Self-pay | Admitting: Internal Medicine

## 2022-12-18 ENCOUNTER — Other Ambulatory Visit: Payer: Self-pay

## 2023-02-01 ENCOUNTER — Other Ambulatory Visit: Payer: Self-pay

## 2023-06-08 ENCOUNTER — Other Ambulatory Visit: Payer: Self-pay

## 2023-06-08 ENCOUNTER — Other Ambulatory Visit: Payer: Self-pay | Admitting: Internal Medicine

## 2023-06-08 DIAGNOSIS — E1159 Type 2 diabetes mellitus with other circulatory complications: Secondary | ICD-10-CM

## 2023-06-08 DIAGNOSIS — E1142 Type 2 diabetes mellitus with diabetic polyneuropathy: Secondary | ICD-10-CM

## 2023-06-08 MED ORDER — AMLODIPINE BESYLATE 10 MG PO TABS
10.0000 mg | ORAL_TABLET | Freq: Every day | ORAL | 0 refills | Status: DC
  Filled 2023-06-08: qty 30, 30d supply, fill #0

## 2023-06-08 MED ORDER — GLIPIZIDE ER 5 MG PO TB24
5.0000 mg | ORAL_TABLET | Freq: Every day | ORAL | 0 refills | Status: DC
  Filled 2023-06-08: qty 30, 30d supply, fill #0

## 2023-06-08 MED ORDER — "INSULIN SYRINGE-NEEDLE U-100 31G X 5/16"" 0.3 ML MISC"
0 refills | Status: DC
  Filled 2023-06-08: qty 100, 100d supply, fill #0

## 2023-06-09 ENCOUNTER — Other Ambulatory Visit: Payer: Self-pay

## 2023-06-21 ENCOUNTER — Other Ambulatory Visit: Payer: Self-pay

## 2023-06-29 ENCOUNTER — Other Ambulatory Visit: Payer: Self-pay

## 2023-06-29 ENCOUNTER — Ambulatory Visit: Payer: Self-pay | Attending: Internal Medicine | Admitting: Internal Medicine

## 2023-06-29 VITALS — BP 175/73 | HR 72 | Temp 98.4°F | Ht 59.0 in | Wt 184.0 lb

## 2023-06-29 DIAGNOSIS — Z794 Long term (current) use of insulin: Secondary | ICD-10-CM

## 2023-06-29 DIAGNOSIS — I152 Hypertension secondary to endocrine disorders: Secondary | ICD-10-CM

## 2023-06-29 DIAGNOSIS — E1169 Type 2 diabetes mellitus with other specified complication: Secondary | ICD-10-CM

## 2023-06-29 DIAGNOSIS — E669 Obesity, unspecified: Secondary | ICD-10-CM

## 2023-06-29 DIAGNOSIS — Z1231 Encounter for screening mammogram for malignant neoplasm of breast: Secondary | ICD-10-CM

## 2023-06-29 DIAGNOSIS — Z1211 Encounter for screening for malignant neoplasm of colon: Secondary | ICD-10-CM

## 2023-06-29 DIAGNOSIS — E119 Type 2 diabetes mellitus without complications: Secondary | ICD-10-CM

## 2023-06-29 DIAGNOSIS — E1159 Type 2 diabetes mellitus with other circulatory complications: Secondary | ICD-10-CM

## 2023-06-29 DIAGNOSIS — R051 Acute cough: Secondary | ICD-10-CM

## 2023-06-29 DIAGNOSIS — Z7984 Long term (current) use of oral hypoglycemic drugs: Secondary | ICD-10-CM

## 2023-06-29 LAB — POCT GLYCOSYLATED HEMOGLOBIN (HGB A1C): HbA1c, POC (controlled diabetic range): 10.7 % — AB (ref 0.0–7.0)

## 2023-06-29 LAB — GLUCOSE, POCT (MANUAL RESULT ENTRY): POC Glucose: 225 mg/dL — AB (ref 70–99)

## 2023-06-29 MED ORDER — AMLODIPINE BESYLATE 10 MG PO TABS
10.0000 mg | ORAL_TABLET | Freq: Every day | ORAL | 1 refills | Status: DC
  Filled 2023-06-29: qty 90, 90d supply, fill #0
  Filled 2023-09-27: qty 90, 90d supply, fill #1

## 2023-06-29 MED ORDER — "INSULIN SYRINGE-NEEDLE U-100 31G X 5/16"" 0.3 ML MISC"
5 refills | Status: AC
  Filled 2023-06-29: qty 100, 100d supply, fill #0
  Filled 2023-09-17: qty 100, 100d supply, fill #1
  Filled 2023-12-13 – 2024-01-11 (×2): qty 100, 100d supply, fill #2
  Filled 2024-02-18: qty 100, 100d supply, fill #3

## 2023-06-29 MED ORDER — GLIPIZIDE ER 5 MG PO TB24
5.0000 mg | ORAL_TABLET | Freq: Every day | ORAL | 1 refills | Status: DC
  Filled 2023-06-29: qty 90, 90d supply, fill #0
  Filled 2023-09-27: qty 90, 90d supply, fill #1

## 2023-06-29 MED ORDER — INSULIN GLARGINE 100 UNIT/ML ~~LOC~~ SOLN
24.0000 [IU] | Freq: Every day | SUBCUTANEOUS | 3 refills | Status: DC
  Filled 2023-06-29 – 2023-07-02 (×2): qty 10, 41d supply, fill #0

## 2023-06-29 MED ORDER — LORATADINE 10 MG PO TABS
10.0000 mg | ORAL_TABLET | Freq: Every day | ORAL | 1 refills | Status: AC
Start: 2023-06-29 — End: ?
  Filled 2023-06-29: qty 30, 30d supply, fill #0

## 2023-06-29 MED ORDER — FLUTICASONE PROPIONATE 50 MCG/ACT NA SUSP
1.0000 | Freq: Every day | NASAL | 0 refills | Status: AC
  Filled 2023-06-29: qty 16, 60d supply, fill #0

## 2023-06-29 MED ORDER — LABETALOL HCL 200 MG PO TABS
200.0000 mg | ORAL_TABLET | Freq: Two times a day (BID) | ORAL | 3 refills | Status: DC
  Filled 2023-06-29: qty 180, 90d supply, fill #0
  Filled 2023-09-27: qty 60, 30d supply, fill #1
  Filled 2023-09-30: qty 180, 90d supply, fill #1
  Filled 2024-02-18: qty 10, 5d supply, fill #2
  Filled 2024-02-18: qty 180, 90d supply, fill #2
  Filled 2024-02-18: qty 150, 75d supply, fill #2

## 2023-06-29 MED ORDER — LOSARTAN POTASSIUM 100 MG PO TABS
100.0000 mg | ORAL_TABLET | Freq: Every day | ORAL | 3 refills | Status: AC
  Filled 2023-06-29: qty 90, 90d supply, fill #0
  Filled 2023-09-27: qty 60, 60d supply, fill #1
  Filled 2023-09-30: qty 90, 90d supply, fill #1
  Filled 2024-02-18: qty 90, 90d supply, fill #2

## 2023-06-29 NOTE — Progress Notes (Signed)
 Patient ID: April Fry, female    DOB: 1960/02/11  MRN: [id number]  CC: Diabetes (DM f/u. Evelene Hint X2 weeks, worsens at night - currently taking Nyquil & Tessalon  but has not helped /Darkening spots on bilateral legs)   Subjective: April Fry is a 63 y.o. female who presents for chronic ds management. Her concerns today include:  Patient with history of DM type II with peripheral neuropathy, HTN, HL, LBBB, cryptogenic CVA, obesity   [employer] Language interpreter used during this encounter. #[phone]   Discussed the use of AI scribe software for clinical note transcription with the patient, who gave verbal consent to proceed.  History of Present Illness April Fry is a 63 year old female with diabetes who presents with a two-week history of dry cough.  She experiences a strong dry cough leading to headaches, without phlegm, shortness of breath, nasal or chest congestion, sore throat, fever, or postnasal drip. Denies heartburn symptoms or any recent sick contacts. Nyquil and Tessalon  every eight hours have not alleviated the cough.  DM:  Lab Results  Component Value Date   HGBA1C 10.7 (A) 06/29/2023  Her diabetes management is suboptimal, with an A1c of 10.7. She takes metformin  1000 mg twice daily, Lantus  insulin  18 units daily, and glipizide  5 mg daily. Blood sugar levels range from 260 to 270 mg/dL, occasionally dropping to 205 to 210 mg/dL before breakfast. Consumption of sugary foods or drinks increases her blood sugar levels. She does not exercise regularly.  HTN/CVA: She has run out of amlodipine , losartan , and labetalol  and has not obtained refills.    Patient Active Problem List   Diagnosis Date Noted   Major depressive disorder, single episode, moderate (HCC) 01/20/2021   Cryptogenic stroke (HCC) 04/11/2018   Hyperglycemia    Hypokalemia    Hyperlipidemia LDL goal <70    Class 1 obesity due to excess calories with serious comorbidity and body  mass index (BMI) of 33.0 to 33.9 in adult    Cerebellar stroke (HCC) 01/31/2018   Hyperlipidemia associated with type 2 diabetes mellitus (HCC) 06/17/2016   Diabetes mellitus (HCC) 05/21/2016   Essential hypertension 05/21/2016   LBBB (left bundle branch block) 05/21/2016     Current Outpatient Medications on File Prior to Visit  Medication Sig Dispense Refill   aspirin  EC 81 MG tablet Take 1 tablet (81 mg total) by mouth daily. Swallow whole. 90 tablet 1   blood glucose meter kit and supplies KIT Dispense based on patient and insurance preference. Use up to four times daily as directed. (FOR ICD-9 250.00, 250.01). 1 each 0   Blood Glucose Monitoring Suppl (TRUE METRIX METER) w/Device KIT Use to check blood sugar in the morning. 1 kit 0   cyclobenzaprine  (FLEXERIL ) 10 MG tablet Take 1 tablet (10 mg total) by mouth 2 (two) times daily as needed for muscle spasms. 20 tablet 0   glucose blood (TRUE METRIX BLOOD GLUCOSE TEST) test strip Use as instructed 100 each 12   ibuprofen  (ADVIL ) 600 MG tablet Take 1 tablet (600 mg total) by mouth every 6 (six) hours as needed. 30 tablet 0   metFORMIN  (GLUCOPHAGE ) 1000 MG tablet Take 1 tablet (1,000 mg total) by mouth 2 (two) times daily with a meal. 180 tablet 3   pregabalin  (LYRICA ) 50 MG capsule Take 1 capsule (50 mg total) by mouth 2 (two) times daily. 60 capsule 4   rosuvastatin  (CRESTOR ) 20 MG tablet Take 1 tablet (20 mg total) by mouth daily. 90 tablet  3   TRUEplus Lancets 28G MISC Use to check blood sugar daily. 100 each 11   No current facility-administered medications on file prior to visit.    No Known Allergies  Social History   Socioeconomic History   Marital status: Single    Spouse name: Not on file   Number of children: 5   Years of education: Not on file   Highest education level: 6th grade  Occupational History   Not on file  Tobacco Use   Smoking status: Never   Smokeless tobacco: Never  Vaping Use   Vaping status: Never  Used  Substance and Sexual Activity   Alcohol use: Yes    Comment: on rare occasions.    Drug use: No   Sexual activity: Yes    Birth control/protection: Post-menopausal  Other Topics Concern   Not on file  Social History Narrative   Not on file   Social Drivers of Health   Financial Resource Strain: Low Risk  (06/29/2023)   Overall Financial Resource Strain (CARDIA)    Difficulty of Paying Living Expenses: Not hard at all  Food Insecurity: No Food Insecurity (06/29/2023)   Hunger Vital Sign    Worried About Running Out of Food in the Last Year: Never true    Ran Out of Food in the Last Year: Never true  Transportation Needs: No Transportation Needs (04/03/2021)   PRAPARE - Administrator, Civil Service (Medical): No    Lack of Transportation (Non-Medical): No  Physical Activity: Inactive (06/29/2023)   Exercise Vital Sign    Days of Exercise per Week: 0 days    Minutes of Exercise per Session: 0 min  Stress: No Stress Concern Present (06/29/2023)   [employer] of Occupational Health - Occupational Stress Questionnaire    Feeling of Stress : Not at all  Social Connections: Socially Isolated (06/29/2023)   Social Connection and Isolation Panel [NHANES]    Frequency of Communication with Friends and Family: Once a week    Frequency of Social Gatherings with Friends and Family: Once a week    Attends Religious Services: Never    Database administrator or Organizations: No    Attends Banker Meetings: Never    Marital Status: Widowed  Intimate Partner Violence: Not At Risk (06/29/2023)   Humiliation, Afraid, Rape, and Kick questionnaire    Fear of Current or Ex-Partner: No    Emotionally Abused: No    Physically Abused: No    Sexually Abused: No    Family History  Problem Relation Age of Onset   Other Mother        parents passed away in an earthquake in 1980's   Heart attack Sister    Obesity Sister     Past Surgical History:  Procedure  Laterality Date   ABDOMINAL HYSTERECTOMY     LOOP RECORDER INSERTION N/A 04/11/2018   Procedure: LOOP RECORDER INSERTION;  Surgeon: Tammie Fall, MD;  [facility];  Service: Cardiovascular;  Laterality: N/A;   TEE WITHOUT CARDIOVERSION N/A 02/08/2018   Procedure: TRANSESOPHAGEAL ECHOCARDIOGRAM (TEE);  Surgeon: Hugh Madura, MD;  [facility] END[facility];  Service: Cardiovascular;  Laterality: N/A;    ROS: Review of Systems Negative except as stated above  PHYSICAL EXAM: BP (!) 175/73 (BP Location: Left Arm, Patient Position: Sitting, Cuff Size: Large)   Pulse 72   Temp 98.4 F (36.9 C) (Oral)   Ht 4\' 11"  (1.499 m)  Wt 184 lb (83.5 kg)   SpO2 97%   BMI 37.16 kg/m   Wt Readings from Last 3 Encounters:  06/29/23 184 lb (83.5 kg)  02/27/22 182 lb (82.6 kg)  05/15/21 180 lb (81.6 kg)    Physical Exam  General appearance - alert, well appearing, and in no distress Mental status - normal mood, behavior, speech, dress, motor activity, and thought processes Nose - normal and patent, no erythema, discharge or polyps Mouth -oral mucosa is moist.  Throat not well-visualized as patient was gagging Neck - supple, no significant adenopathy Chest - clear to auscultation, no wheezes, rales or rhonchi, symmetric air entry Heart - normal rate, regular rhythm, normal S1, S2, no murmurs, rubs, clicks or gallops Extremities - peripheral pulses normal, no pedal edema, no clubbing or cyanosis Diabetic Foot Exam - Simple   Simple Foot Form Diabetic Foot exam was performed with the following findings: Yes 06/29/2023  8:50 AM  Visual Inspection No deformities, no ulcerations, no other skin breakdown bilaterally: Yes Sensation Testing Intact to touch and monofilament testing bilaterally: Yes Pulse Check Posterior Tibialis and Dorsalis pulse intact bilaterally: Yes Comments         Latest Ref Rng & Units 06/29/2023   12:21 PM 02/27/2022    2:43 PM 05/15/2021   10:24 PM  CMP   Glucose 70 - 99 mg/dL 161  096  045   BUN 8 - 27 mg/dL 7  10  11    Creatinine 0.57 - 1.00 mg/dL 4.09  8.11  9.14   Sodium 134 - 144 mmol/L 140  138  138   Potassium 3.5 - 5.2 mmol/L 4.6  4.3  4.1   Chloride 96 - 106 mmol/L 102  101  102   CO2 20 - 29 mmol/L 22  24    Calcium  8.7 - 10.3 mg/dL 9.0  9.2    Total Protein 6.0 - 8.5 g/dL 7.2  6.9    Total Bilirubin 0.0 - 1.2 mg/dL 0.7  0.4    Alkaline Phos 44 - 121 IU/L 98  94    AST 0 - 40 IU/L 16  13    ALT 0 - 32 IU/L 16  16     Lipid Panel     Component Value Date/Time   CHOL 210 (H) 06/29/2023 1221   TRIG 174 (H) 06/29/2023 1221   HDL 45 06/29/2023 1221   CHOLHDL 4.7 (H) 06/29/2023 1221   CHOLHDL 3.2 02/01/2018 0011   VLDL 14 02/01/2018 0011   LDLCALC 134 (H) 06/29/2023 1221    CBC    Component Value Date/Time   WBC 7.3 06/29/2023 1221   WBC 8.8 04/15/2020 1251   RBC 5.04 06/29/2023 1221   RBC 4.66 04/15/2020 1251   HGB 14.5 06/29/2023 1221   HCT 45.3 06/29/2023 1221   PLT 259 06/29/2023 1221   MCV 90 06/29/2023 1221   MCH 28.8 06/29/2023 1221   MCH 27.9 04/15/2020 1251   MCHC 32.0 06/29/2023 1221   MCHC 32.8 04/15/2020 1251   RDW 13.1 06/29/2023 1221   LYMPHSABS 1.1 01/31/2018 1035   MONOABS 0.3 01/31/2018 1035   EOSABS 0.0 01/31/2018 1035   BASOSABS 0.0 01/31/2018 1035    ASSESSMENT AND PLAN: 1. Type 2 diabetes mellitus with obesity (HCC) (Primary) A1c at 10.7 indicates poor glycemic control. Blood sugars 205-270 mg/dL pre-breakfast. Current regimen: metformin  1000 mg BID, Lantus  18 units daily, glipizide  5 mg daily. Aware of dietary triggers, lacks regular exercise. - Increase Lantus   to 24 units daily. - Encourage regular exercise. - Schedule follow-up with clinical pharmacist in four weeks. - POCT glycosylated hemoglobin (Hb A1C) - POCT glucose (manual entry) - insulin  glargine (LANTUS ) 100 UNIT/ML injection; Inject 0.24 mLs (24 Units total) into the skin at bedtime.  Dispense: 10 mL; Refill: 3 - Insulin   Syringe-Needle U-100 (TRUEPLUS INSULIN  SYRINGE) 31G X 5/16" 0.3 ML MISC; USE TO INJECT INSULIN  DAILY.  Dispense: 100 each; Refill: 5 - glipiZIDE  (GLUCOTROL  XL) 5 MG 24 hr tablet; Take 1 tablet (5 mg total) by mouth daily with breakfast.  Dispense: 90 tablet; Refill: 1 - Microalbumin / creatinine urine ratio - CBC - Comprehensive metabolic panel with GFR - Lipid panel  2. Insulin  long-term use (HCC) 3. Diabetes mellitus treated with oral medication (HCC) See #1 above  4. Hypertension associated with diabetes (HCC) Not at goal and has been out of her meds.  Rfs sent  - labetalol  (NORMODYNE ) 200 MG tablet; Take 1 tablet (200 mg total) by mouth 2 (two) times daily.  Dispense: 180 tablet; Refill: 3 - losartan  (COZAAR ) 100 MG tablet; Take 1 tablet (100 mg total) by mouth daily.  Dispense: 90 tablet; Refill: 3 - amLODipine  (NORVASC ) 10 MG tablet; Take 1 tablet (10 mg total) by mouth daily.  Dispense: 90 tablet; Refill: 1  5. Acute cough DDX include UACS, allergies. Give trail of Claritin and Flonase - Advise urgent care visit if cough persists in two weeks. - loratadine (CLARITIN) 10 MG tablet; Take 1 tablet (10 mg total) by mouth daily.  Dispense: 30 tablet; Refill: 1 - fluticasone (FLONASE) 50 MCG/ACT nasal spray; Place 1 spray into both nostrils daily.  Dispense: 16 g; Refill: 0  6. Encounter for screening mammogram for malignant neoplasm of breast - MM 3D SCREENING MAMMOGRAM BILATERAL BREAST; Future  7. Screening for colon cancer - Fecal occult blood, imunochemical([facility])  Patient was given the opportunity to ask questions.  Patient verbalized understanding of the plan and was able to repeat key elements of the plan.   This documentation was completed using Paediatric nurse.  Any transcriptional errors are unintentional.  Orders Placed This Encounter  Procedures   Fecal occult blood, imunochemical([facility])   MM 3D SCREENING MAMMOGRAM BILATERAL  BREAST   Microalbumin / creatinine urine ratio   CBC   Comprehensive metabolic panel with GFR   Lipid panel   POCT glycosylated hemoglobin (Hb A1C)   POCT glucose (manual entry)     Requested Prescriptions   Signed Prescriptions Disp Refills   insulin  glargine (LANTUS ) 100 UNIT/ML injection 10 mL 3    Sig: Inject 0.24 mLs (24 Units total) into the skin at bedtime.   Insulin  Syringe-Needle U-100 (TRUEPLUS INSULIN  SYRINGE) 31G X 5/16" 0.3 ML MISC 100 each 5    Sig: USE TO INJECT INSULIN  DAILY.   labetalol  (NORMODYNE ) 200 MG tablet 180 tablet 3    Sig: Take 1 tablet (200 mg total) by mouth 2 (two) times daily.   glipiZIDE  (GLUCOTROL  XL) 5 MG 24 hr tablet 90 tablet 1    Sig: Take 1 tablet (5 mg total) by mouth daily with breakfast.   losartan  (COZAAR ) 100 MG tablet 90 tablet 3    Sig: Take 1 tablet (100 mg total) by mouth daily.   amLODipine  (NORVASC ) 10 MG tablet 90 tablet 1    Sig: Take 1 tablet (10 mg total) by mouth daily.   loratadine (CLARITIN) 10 MG tablet 30 tablet 1    Sig: Take 1 tablet (  10 mg total) by mouth daily.   fluticasone (FLONASE) 50 MCG/ACT nasal spray 16 g 0    Sig: Place 1 spray into both nostrils daily.    Return in about 4 months (around 10/30/2023) for 4 weeks with clinical pharmacist for DM.  Concetta Dee, MD, FACP

## 2023-06-29 NOTE — Patient Instructions (Signed)
 Increase Insulin  to 24 units daily. Follow up with clinical pharmacist in 1 month.  Bring readings with you.  Please follow up in 2 weeks if cough does not resolve with use of Claritin and allergy nasal spray.

## 2023-06-30 ENCOUNTER — Ambulatory Visit: Payer: Self-pay | Admitting: Internal Medicine

## 2023-06-30 ENCOUNTER — Encounter: Payer: Self-pay | Admitting: Internal Medicine

## 2023-06-30 ENCOUNTER — Other Ambulatory Visit: Payer: Self-pay | Admitting: Internal Medicine

## 2023-06-30 ENCOUNTER — Other Ambulatory Visit: Payer: Self-pay

## 2023-06-30 DIAGNOSIS — E1169 Type 2 diabetes mellitus with other specified complication: Secondary | ICD-10-CM

## 2023-06-30 MED ORDER — ROSUVASTATIN CALCIUM 20 MG PO TABS
20.0000 mg | ORAL_TABLET | Freq: Every day | ORAL | 3 refills | Status: AC
  Filled 2023-06-30: qty 90, 90d supply, fill #0
  Filled 2023-09-27: qty 90, 90d supply, fill #1
  Filled 2024-02-18: qty 90, 90d supply, fill #2

## 2023-07-01 LAB — LIPID PANEL
Chol/HDL Ratio: 4.7 ratio — ABNORMAL HIGH (ref 0.0–4.4)
Cholesterol, Total: 210 mg/dL — ABNORMAL HIGH (ref 100–199)
HDL: 45 mg/dL (ref >39)
LDL Chol Calc (NIH): 134 mg/dL — ABNORMAL HIGH (ref 0–99)
Triglycerides: 174 mg/dL — ABNORMAL HIGH (ref 0–149)
VLDL Cholesterol Cal: 31 mg/dL (ref 5–40)

## 2023-07-01 LAB — CBC
Hematocrit: 45.3 % (ref 34.0–46.6)
Hemoglobin: 14.5 g/dL (ref 11.1–15.9)
MCH: 28.8 pg (ref 26.6–33.0)
MCHC: 32 g/dL (ref 31.5–35.7)
MCV: 90 fL (ref 79–97)
Platelets: 259 10*3/uL (ref 150–450)
RBC: 5.04 x10E6/uL (ref 3.77–5.28)
RDW: 13.1 % (ref 11.7–15.4)
WBC: 7.3 10*3/uL (ref 3.4–10.8)

## 2023-07-01 LAB — COMPREHENSIVE METABOLIC PANEL WITH GFR
ALT: 16 IU/L (ref 0–32)
AST: 16 IU/L (ref 0–40)
Albumin: 4.2 g/dL (ref 3.9–4.9)
Alkaline Phosphatase: 98 IU/L (ref 44–121)
BUN/Creatinine Ratio: 17 (ref 12–28)
BUN: 7 mg/dL — ABNORMAL LOW (ref 8–27)
Bilirubin Total: 0.7 mg/dL (ref 0.0–1.2)
CO2: 22 mmol/L (ref 20–29)
Calcium: 9 mg/dL (ref 8.7–10.3)
Chloride: 102 mmol/L (ref 96–106)
Creatinine, Ser: 0.42 mg/dL — ABNORMAL LOW (ref 0.57–1.00)
Globulin, Total: 3 g/dL (ref 1.5–4.5)
Glucose: 180 mg/dL — ABNORMAL HIGH (ref 70–99)
Potassium: 4.6 mmol/L (ref 3.5–5.2)
Sodium: 140 mmol/L (ref 134–144)
Total Protein: 7.2 g/dL (ref 6.0–8.5)
eGFR: 110 mL/min/{1.73_m2} (ref >59)

## 2023-07-01 LAB — MICROALBUMIN / CREATININE URINE RATIO
Creatinine, Urine: 76.9 mg/dL
Microalb/Creat Ratio: 171 mg/g{creat} — ABNORMAL HIGH (ref 0–29)
Microalbumin, Urine: 131.5 ug/mL

## 2023-07-02 ENCOUNTER — Other Ambulatory Visit: Payer: Self-pay

## 2023-07-06 ENCOUNTER — Other Ambulatory Visit: Payer: Self-pay

## 2023-07-12 ENCOUNTER — Telehealth: Payer: Self-pay

## 2023-07-12 NOTE — Telephone Encounter (Signed)
 Telephoned patient using interpreter#[phone]. Patient completed screening process [facility] (scholarship) and declined scheduling appointment.

## 2023-08-03 ENCOUNTER — Ambulatory Visit: Payer: Self-pay | Admitting: Pharmacist

## 2023-08-05 ENCOUNTER — Other Ambulatory Visit: Payer: Self-pay

## 2023-08-09 ENCOUNTER — Other Ambulatory Visit: Payer: Self-pay

## 2023-08-11 ENCOUNTER — Other Ambulatory Visit: Payer: Self-pay

## 2023-08-12 ENCOUNTER — Other Ambulatory Visit: Payer: Self-pay

## 2023-08-16 ENCOUNTER — Telehealth: Payer: Self-pay

## 2023-08-16 ENCOUNTER — Other Ambulatory Visit: Payer: Self-pay

## 2023-08-16 NOTE — Telephone Encounter (Signed)
 Received notification from [facility] regarding approval for LANTUS  VIAL 100MG /ML. Patient assistance approved from 08/15/2023 to 08/14/2024.  Medication will ship to [facility] [address], [place] [address]  Pt ID: [id number]  Company phone: [phone]

## 2023-08-19 ENCOUNTER — Other Ambulatory Visit: Payer: Self-pay

## 2023-09-03 ENCOUNTER — Ambulatory Visit: Payer: Self-pay | Admitting: Internal Medicine

## 2023-09-14 ENCOUNTER — Telehealth: Payer: Self-pay | Admitting: Internal Medicine

## 2023-09-14 NOTE — Telephone Encounter (Signed)
 Confirmed  appt for 8/8

## 2023-09-17 ENCOUNTER — Other Ambulatory Visit: Payer: Self-pay

## 2023-09-17 ENCOUNTER — Ambulatory Visit: Payer: Self-pay | Attending: Family Medicine | Admitting: Pharmacist

## 2023-09-17 ENCOUNTER — Encounter: Payer: Self-pay | Admitting: Pharmacist

## 2023-09-17 VITALS — BP 189/83

## 2023-09-17 DIAGNOSIS — E669 Obesity, unspecified: Secondary | ICD-10-CM

## 2023-09-17 DIAGNOSIS — E1169 Type 2 diabetes mellitus with other specified complication: Secondary | ICD-10-CM

## 2023-09-17 DIAGNOSIS — Z794 Long term (current) use of insulin: Secondary | ICD-10-CM

## 2023-09-17 DIAGNOSIS — Z7984 Long term (current) use of oral hypoglycemic drugs: Secondary | ICD-10-CM

## 2023-09-17 DIAGNOSIS — I10 Essential (primary) hypertension: Secondary | ICD-10-CM

## 2023-09-17 MED ORDER — INSULIN GLARGINE 100 UNIT/ML ~~LOC~~ SOLN
34.0000 [IU] | Freq: Every day | SUBCUTANEOUS | 2 refills | Status: DC
  Filled 2023-09-17: qty 10, 29d supply, fill #0
  Filled 2023-10-13 – 2023-11-15 (×2): qty 10, 29d supply, fill #1
  Filled 2023-12-13: qty 10, 28d supply, fill #2
  Filled 2024-01-11: qty 10, 29d supply, fill #2

## 2023-09-17 NOTE — Progress Notes (Signed)
 S:    [employer] interpreters used for this visit. ID: [id number], [name]  No chief complaint on file.  63 y.o. female who presents for diabetes evaluation, education, and management. Patient arrives in good spirits and presents without any assistance.   Patient was referred and last seen by Primary Care Provider, Dr. Vicci, on 06/29/2023. BP was 175/73 mmHg at that visit. A1c was 10.7 (up from 10.1 prior). She was instructed to follow-up with me 4 weeks after that appointment but is here today. She was instructed to increase her Lantus  to 24 units daily.   PMH is significant for T2DM (with peripheral neuropathy), HTN, HLD, LBBB, cryptogenic CVA, obesity. Patient reports Diabetes is longstanding.   Today, pt endorses continued hyperglycemia. She tells me she is adherent to her medications but is unable to verbalize names, doses, or frequencies of her DM medications. She does not have any with her today.   Family/Social History:  Fhx: MI, obesity  Tobacco: never smoker Alcohol use: none reported   Current diabetes medications include: glipizide  5 mg XL daily, Lantus  24 units daily, metformin  1000 mg BID Current hypertension medications include: amlodipine  10 mg daily, labetalol  200 mg BID, losartan  100 mg daily   Patient reports adherence to taking all medications as prescribed. Dispense records indicate she goes times without medications. While she is up to date with most medications currently, she has not fill insulin  or metformin  recently. Listed below are her most recent 2 fills of her DM and HTN medications (with day supply):  -Glipizide : 06/29/23 (90 day), 11/10/2022 (90 day) -Lantus : 07/02/23 (41 day), 06/08/23 (28 day) -Metformin : 11/10/2022 (90 day), 07/14/22 (90 day)  -Amlodipine : 06/29/23 (90 day), 11/10/2022 (90 day) -labetalol : 06/29/2023 (90 day), 11/21/2021 (30 day) -losartan : 06/29/2023 (90 day), 02/27/2022 (90 day)  Insurance coverage: self pay  Patient denies hypoglycemic  events.  Reported home fasting blood sugars: 220 - 280  Reported 2 hour post-meal/random blood sugars: tells me these are mostly >300.  Patient denies nocturia (nighttime urination).  Patient reports neuropathy (nerve pain). Patient denies visual changes. Patient reports self foot exams.   Patient reported dietary habits:  -Admits to dietary indiscretion but is limited to what she can afford.   Patient-reported exercise habits: none  O:  No CGM or GM with her today.   Lab Results  Component Value Date   HGBA1C 10.7 (A) 06/29/2023   There were no vitals filed for this visit.  Lipid Panel     Component Value Date/Time   CHOL 210 (H) 06/29/2023 1221   TRIG 174 (H) 06/29/2023 1221   HDL 45 06/29/2023 1221   CHOLHDL 4.7 (H) 06/29/2023 1221   CHOLHDL 3.2 02/01/2018 0011   VLDL 14 02/01/2018 0011   LDLCALC 134 (H) 06/29/2023 1221    Clinical Atherosclerotic Cardiovascular Disease (ASCVD): Yes  The ASCVD Risk score (Arnett DK, et al., 2019) failed to calculate for the following reasons:   Risk score cannot be calculated because patient has a medical history suggesting prior/existing ASCVD   Patient is participating in a Managed Medicaid Plan: No   A/P: Diabetes longstanding currently uncontrolled based on A1c of 10.7 in May. Patient is not currently symptomatic. She is not hypoglycemic but able to verbalize appropriate hypoglycemia management plan. Medication adherence appears to be suboptimal. I have increased her insulin  and sent refills for her to pick-up before leaving the building. I have encouraged better adherence and will see her in 1 month for further adjustment.  -Increased dose  of Lantus  (insulin  glargine) to 34 units daily. Refills sent downstairs.   -Continue metformin  1000 mg BID.  -Continue glipizide  5 mg XL daily.  -Patient educated on purpose, proper use, and potential adverse effects of Lantus .  -Extensively discussed pathophysiology of diabetes, recommended  lifestyle interventions, dietary effects on blood sugar control.  -Counseled on s/sx of and management of hypoglycemia.  -Next A1c anticipated 09/2023.   Hypertension longstanding currently above goal secondary to not taking medication this morning prior to today's appt. Medication non-adherence is playing a role on a bigger scale. As summarized above she goes prolonged periods of time between PCP care and medication fills. Blood pressure goal of <130/80 mmHg. I encouraged medication adherence and asked her to take her BP medications prior to seeing me in 1 month.  -Continued amlodipine  10 mg. -Continued labetalol  200 mg BID.  -Continued losartan  100 mg daily.   Written patient instructions provided. Patient verbalized understanding of treatment plan.  Total time in face to face counseling 30 minutes.    Follow-up:  Pharmacist in 1 month PCP clinic visit in 11/05/2023  [name], PharmD, BCACP, CPP Clinical Pharmacist [facility] & [facility] [phone]

## 2023-09-27 ENCOUNTER — Other Ambulatory Visit: Payer: Self-pay | Admitting: Pharmacist

## 2023-09-27 ENCOUNTER — Other Ambulatory Visit: Payer: Self-pay

## 2023-09-27 DIAGNOSIS — E1142 Type 2 diabetes mellitus with diabetic polyneuropathy: Secondary | ICD-10-CM

## 2023-09-27 MED ORDER — METFORMIN HCL 1000 MG PO TABS
1000.0000 mg | ORAL_TABLET | Freq: Two times a day (BID) | ORAL | 3 refills | Status: AC
  Filled 2023-09-27: qty 180, 90d supply, fill #0
  Filled 2024-02-18: qty 180, 90d supply, fill #1

## 2023-09-28 ENCOUNTER — Other Ambulatory Visit: Payer: Self-pay

## 2023-09-30 ENCOUNTER — Other Ambulatory Visit: Payer: Self-pay | Admitting: Pharmacist

## 2023-09-30 ENCOUNTER — Other Ambulatory Visit: Payer: Self-pay

## 2023-09-30 DIAGNOSIS — E119 Type 2 diabetes mellitus without complications: Secondary | ICD-10-CM

## 2023-09-30 DIAGNOSIS — I10 Essential (primary) hypertension: Secondary | ICD-10-CM

## 2023-09-30 NOTE — Progress Notes (Signed)
 Pharmacy Quality Measure Review  This patient is appearing on a report for adherence measure for cholesterol (statin), diabetes, and hypertension (ACEi/ARB) medications this calendar year.   Medication: amlodipine  Last fill date: 06/29/23 for 90 day supply  Medication: glipizide  Last fill date: 06/29/23 for 90 day supply  Medication: glargine insulin  Last fill date: 09/17/23 for 29 day supply  Medication: labetalol   Last fill date: 06/29/2023 for 90 day supply  Medication: losartan   Last fill date: 06/29/2023 for 90 day supply  Medication: metformin  Last fill date: 11/10/2022 for 90 day supply  Medication: rosuvastatin   Last fill date: 07/02/23 for 90 day supply  Contacted pharmacy to facilitate refills. Reminder set for insulin  refill in 2 weeks.  [name], PharmD, JAQUELINE, CPP Clinical Pharmacist [facility] & [facility] [phone]

## 2023-10-01 ENCOUNTER — Other Ambulatory Visit: Payer: Self-pay

## 2023-10-08 ENCOUNTER — Other Ambulatory Visit: Payer: Self-pay

## 2023-10-13 ENCOUNTER — Other Ambulatory Visit: Payer: Self-pay

## 2023-10-13 ENCOUNTER — Other Ambulatory Visit (HOSPITAL_BASED_OUTPATIENT_CLINIC_OR_DEPARTMENT_OTHER): Payer: Self-pay | Admitting: Pharmacist

## 2023-10-13 DIAGNOSIS — I10 Essential (primary) hypertension: Secondary | ICD-10-CM

## 2023-10-13 DIAGNOSIS — E1159 Type 2 diabetes mellitus with other circulatory complications: Secondary | ICD-10-CM

## 2023-10-13 DIAGNOSIS — Z7984 Long term (current) use of oral hypoglycemic drugs: Secondary | ICD-10-CM

## 2023-10-13 DIAGNOSIS — E1169 Type 2 diabetes mellitus with other specified complication: Secondary | ICD-10-CM

## 2023-10-13 MED ORDER — GLIPIZIDE ER 5 MG PO TB24
5.0000 mg | ORAL_TABLET | Freq: Every day | ORAL | 1 refills | Status: DC
  Filled 2023-10-13 – 2024-02-18 (×2): qty 90, 90d supply, fill #0

## 2023-10-13 MED ORDER — AMLODIPINE BESYLATE 10 MG PO TABS
10.0000 mg | ORAL_TABLET | Freq: Every day | ORAL | 1 refills | Status: AC
  Filled 2023-10-13 – 2024-02-18 (×3): qty 90, 90d supply, fill #0

## 2023-10-13 NOTE — Progress Notes (Signed)
 Pharmacy Quality Measure Review  This patient is appearing on a report for adherence measure for cholesterol (statin), diabetes, and hypertension (ACEi/ARB) medications this calendar year.   Medication: amlodipine  - needs new rxn Last fill date: 10/01/23 for 90 day supply  Medication: glipizide  - needs new rxn  Last fill date: 10/01/23 for 90 day supply  Medication: glargine insulin  Last fill date: 09/17/23 for 29 day supply  Medication: labetalol   Last fill date: 10/08/23 for 90 day supply  Medication: losartan   Last fill date: 10/01/23 for 90 day supply  Medication: metformin  Last fill date: 10/01/23 for 90 day supply  Medication: rosuvastatin   Last fill date: 10/01/23 for 90 day supply  Contacted pharmacy to facilitate refills for insulin . New rxns sent for amlodipine  and glipizide  for refills when next due.   [name], PharmD, JAQUELINE, CPP Clinical Pharmacist [facility] & [facility] [phone]

## 2023-10-21 ENCOUNTER — Other Ambulatory Visit: Payer: Self-pay

## 2023-10-22 ENCOUNTER — Other Ambulatory Visit: Payer: Self-pay

## 2023-11-01 ENCOUNTER — Other Ambulatory Visit: Payer: Self-pay

## 2023-11-04 ENCOUNTER — Telehealth: Payer: Self-pay | Admitting: Internal Medicine

## 2023-11-04 NOTE — Telephone Encounter (Signed)
 LVM to confirmed appt for 9/26

## 2023-11-05 ENCOUNTER — Ambulatory Visit: Payer: Self-pay | Admitting: Internal Medicine

## 2023-11-10 ENCOUNTER — Telehealth: Payer: Self-pay | Admitting: Internal Medicine

## 2023-11-10 NOTE — Telephone Encounter (Signed)
 Confirmed appt for 10/3

## 2023-11-12 ENCOUNTER — Ambulatory Visit: Payer: Self-pay | Admitting: Pharmacist

## 2023-11-15 ENCOUNTER — Other Ambulatory Visit: Payer: Self-pay

## 2023-12-13 ENCOUNTER — Other Ambulatory Visit: Payer: Self-pay

## 2023-12-21 ENCOUNTER — Other Ambulatory Visit: Payer: Self-pay

## 2023-12-22 ENCOUNTER — Other Ambulatory Visit: Payer: Self-pay

## 2023-12-24 ENCOUNTER — Other Ambulatory Visit: Payer: Self-pay

## 2023-12-27 ENCOUNTER — Other Ambulatory Visit: Payer: Self-pay

## 2024-01-11 ENCOUNTER — Other Ambulatory Visit: Payer: Self-pay
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
## 2024-02-07 ENCOUNTER — Other Ambulatory Visit: Payer: Self-pay

## 2024-02-18 ENCOUNTER — Other Ambulatory Visit: Payer: Self-pay | Admitting: Internal Medicine

## 2024-02-18 ENCOUNTER — Telehealth: Payer: Self-pay

## 2024-02-18 ENCOUNTER — Other Ambulatory Visit: Payer: Self-pay

## 2024-02-18 DIAGNOSIS — E669 Obesity, unspecified: Secondary | ICD-10-CM

## 2024-02-18 MED ORDER — INSULIN GLARGINE 100 UNIT/ML ~~LOC~~ SOLN
34.0000 [IU] | Freq: Every day | SUBCUTANEOUS | 0 refills | Status: DC
  Filled 2024-02-18: qty 10, 29d supply, fill #0

## 2024-02-18 NOTE — Telephone Encounter (Signed)
 Noted! Thank you

## 2024-02-18 NOTE — Telephone Encounter (Signed)
 Patient came in requesting refills on insulin  glargine (LANTUS ) 100 UNIT/ML injection

## 2024-02-25 ENCOUNTER — Encounter: Payer: Self-pay | Admitting: Internal Medicine

## 2024-02-25 ENCOUNTER — Other Ambulatory Visit: Payer: Self-pay

## 2024-02-25 ENCOUNTER — Ambulatory Visit: Payer: Self-pay | Admitting: Internal Medicine

## 2024-02-25 DIAGNOSIS — Z7984 Long term (current) use of oral hypoglycemic drugs: Secondary | ICD-10-CM

## 2024-02-25 DIAGNOSIS — Z23 Encounter for immunization: Secondary | ICD-10-CM

## 2024-02-25 DIAGNOSIS — I10 Essential (primary) hypertension: Secondary | ICD-10-CM

## 2024-02-25 DIAGNOSIS — Z1231 Encounter for screening mammogram for malignant neoplasm of breast: Secondary | ICD-10-CM

## 2024-02-25 DIAGNOSIS — Z6837 Body mass index (BMI) 37.0-37.9, adult: Secondary | ICD-10-CM

## 2024-02-25 DIAGNOSIS — Z1211 Encounter for screening for malignant neoplasm of colon: Secondary | ICD-10-CM

## 2024-02-25 DIAGNOSIS — E785 Hyperlipidemia, unspecified: Secondary | ICD-10-CM

## 2024-02-25 DIAGNOSIS — E1159 Type 2 diabetes mellitus with other circulatory complications: Secondary | ICD-10-CM

## 2024-02-25 DIAGNOSIS — E1169 Type 2 diabetes mellitus with other specified complication: Secondary | ICD-10-CM

## 2024-02-25 DIAGNOSIS — Z794 Long term (current) use of insulin: Secondary | ICD-10-CM

## 2024-02-25 LAB — POCT GLYCOSYLATED HEMOGLOBIN (HGB A1C): HbA1c, POC (controlled diabetic range): 10.5 % — AB (ref 0.0–7.0)

## 2024-02-25 LAB — GLUCOSE, POCT (MANUAL RESULT ENTRY): POC Glucose: 293 mg/dL — AB (ref 70–99)

## 2024-02-25 MED ORDER — GLIPIZIDE ER 5 MG PO TB24
5.0000 mg | ORAL_TABLET | Freq: Every day | ORAL | 1 refills | Status: AC
  Filled 2024-02-25: qty 90, 90d supply, fill #0

## 2024-02-25 MED ORDER — LABETALOL HCL 200 MG PO TABS
200.0000 mg | ORAL_TABLET | Freq: Two times a day (BID) | ORAL | 3 refills | Status: AC
  Filled 2024-02-25: qty 180, 90d supply, fill #0

## 2024-02-25 MED ORDER — INSULIN GLARGINE 100 UNIT/ML ~~LOC~~ SOLN
37.0000 [IU] | Freq: Every day | SUBCUTANEOUS | 5 refills | Status: AC
  Filled 2024-02-25 (×2): qty 10, 27d supply, fill #0

## 2024-02-25 NOTE — Patient Instructions (Signed)
" °  VISIT SUMMARY: April Fry, you had a follow-up appointment today to review your chronic conditions, including diabetes, hypertension, and hyperlipidemia. We discussed your current medications, blood sugar levels, and dietary habits. We also addressed your need for cancer screenings.  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 We increased your Lantus  insulin  to 37 units daily and refilled your glipizide . You should reduce high-sugar fruits and drinks, increase lean proteins and low-sugar fruits, and monitor your blood sugar regularly, keeping a log of your readings.  -HYPERTENSION ASSOCIATED WITH DIABETES: Your blood pressure is elevated, and you have not been taking your medications regularly due to concerns about gastritis. We recommend you monitor your blood pressure weekly and keep a log. Our goal is to have your blood pressure at 130/80 mmHg or lower.  -HYPERLIPIDEMIA ASSOCIATED WITH TYPE 2 DIABETES MELLITUS: You have high cholesterol levels, which is common with diabetes. We have ordered labs to check your cholesterol, kidney, and liver function.  -BREAST CANCER SCREENING: You are overdue for a mammogram. We provided you with a scholarship form for a free screening.  -COLON CANCER SCREENING: You are due for colon cancer screening. We provided you with a stool sample kit for this purpose.  -CERVICAL CANCER SCREENING: You are overdue for a Pap smear. We have scheduled a follow-up appointment for this screening.  INSTRUCTIONS: Please follow up with the recommended cancer screenings and maintain logs for your blood sugar and blood pressure readings. We will review your progress at your next appointment.                      Contains text generated by [employer].                                 Contains text generated by [employer].   "

## 2024-02-25 NOTE — Progress Notes (Signed)
 "   Patient ID: April Fry, female    DOB: [birth year]  MRN: [id number]  CC: Diabetes (DM f/u./No questions / concerns/Flu vax administered on 02/25/24 - C.A.)   Subjective: April Fry is a 64 y.o. female who presents for chronic ds management. Husband [name] is with her. Pt has meds with her. Her chronic medical issues include:  Patient with history of DM type II with peripheral neuropathy, HTN, HL, LBBB, cryptogenic CVA, obesity   AMN Language interpreter used during this encounter. #[id number], [name]   Discussed the use of AI scribe software for clinical note transcription with the patient, who gave verbal consent to proceed.  History of Present Illness April Fry is a 64 year old female with diabetes, hypertension, and hyperlipidemia who presents for follow-up of her chronic medical conditions.  DM: Results for orders placed or performed in visit on 02/25/24  POCT glucose (manual entry)   Collection Time: 02/25/24  9:15 AM  Result Value Ref Range   POC Glucose 293 (A) 70 - 99 mg/dl  POCT glycosylated hemoglobin (Hb A1C)   Collection Time: 02/25/24  9:17 AM  Result Value Ref Range   Hemoglobin A1C     HbA1c POC (<> result, manual entry)     HbA1c, POC (prediabetic range)     HbA1c, POC (controlled diabetic range) 10.5 (A) 0.0 - 7.0 %  Her hemoglobin A1c is currently 10.5; in May of last year, it was 10.7. She has been out of Lantus  insulin  for two weeks due to issues with obtaining it from the pharmacy. However, rxn was sent 02/18/2024 for a RF as she did not have any refills left on her previous rxn. She was not aware that the rxn was sent. She takes metformin  1000 mg twice daily and glipizide  5 mg daily before breakfast. Her blood sugars average 190-200 mg/dL before meals even before running out of Latus, with occasional lows to 80 mg/dL, which cause shakiness. She checks her blood sugar every other day, usually in the morning before breakfast. In terms of  diet, she mentions financial constraints limiting her ability to purchase diabetic-friendly foods, leading her to consume more affordable options like rice, beans, and chicken broth. She consumes papaya and bananas regularly and has switched to diet soda due to her preference for [employer].   HTN: She has not taken her blood pressure medication today as she has not eaten breakfast yet, due to concerns about gastritis. Her current medications include amlodipine , labetalol , and losartan  100 mg daily at home. She does not regularly check her blood pressure at home despite having a device, and she admits to not doing so frequently.  HL: compliant with Crestor  20 with last LDL 134.   HM: over due for DM eye exam. No insurance. Due for MMG, will give MMG scholarship; due for colon CA screening will give FIT kit. Due for pap.  Patient Active Problem List   Diagnosis Date Noted   Major depressive disorder, single episode, moderate (HCC) 01/20/2021   Cryptogenic stroke (HCC) 04/11/2018   Hyperglycemia    Hypokalemia    Hyperlipidemia LDL goal <70    Class 1 obesity due to excess calories with serious comorbidity and body mass index (BMI) of 33.0 to 33.9 in adult    Cerebellar stroke (HCC) 01/31/2018   Hyperlipidemia associated with type 2 diabetes mellitus (HCC) 06/17/2016   Diabetes mellitus (HCC) 05/21/2016   Essential hypertension 05/21/2016   LBBB (left bundle branch block) 05/21/2016     Medications  Ordered Prior to Encounter[1]  Allergies[2]  Social History   Socioeconomic History   Marital status: Single    Spouse name: Not on file   Number of children: 5   Years of education: Not on file   Highest education level: 6th grade  Occupational History   Not on file  Tobacco Use   Smoking status: Never   Smokeless tobacco: Never  Vaping Use   Vaping status: Never Used  Substance and Sexual Activity   Alcohol use: Yes    Comment: on rare occasions.    Drug use: No   Sexual  activity: Yes    Birth control/protection: Post-menopausal  Other Topics Concern   Not on file  Social History Narrative   Not on file   Social Drivers of Health   Tobacco Use: Low Risk (09/17/2023)   Patient History    Smoking Tobacco Use: Never    Smokeless Tobacco Use: Never    Passive Exposure: Not on file  Financial Resource Strain: Low Risk (06/29/2023)   Overall Financial Resource Strain (CARDIA)    Difficulty of Paying Living Expenses: Not hard at all  Food Insecurity: No Food Insecurity (06/29/2023)   Hunger Vital Sign    Worried About Running Out of Food in the Last Year: Never true    Ran Out of Food in the Last Year: Never true  Transportation Needs: No Transportation Needs (04/03/2021)   PRAPARE - Transportation    Lack of Transportation (Medical): No    Lack of Transportation (Non-Medical): No  Physical Activity: Inactive (06/29/2023)   Exercise Vital Sign    Days of Exercise per Week: 0 days    Minutes of Exercise per Session: 0 min  Stress: No Stress Concern Present (06/29/2023)   [employer] of Occupational Health - Occupational Stress Questionnaire    Feeling of Stress : Not at all  Social Connections: Socially Isolated (06/29/2023)   Social Connection and Isolation Panel    Frequency of Communication with Friends and Family: Once a week    Frequency of Social Gatherings with Friends and Family: Once a week    Attends Religious Services: Never    Database Administrator or Organizations: No    Attends Banker Meetings: Never    Marital Status: Widowed  Intimate Partner Violence: Not At Risk (06/29/2023)   Humiliation, Afraid, Rape, and Kick questionnaire    Fear of Current or Ex-Partner: No    Emotionally Abused: No    Physically Abused: No    Sexually Abused: No  Depression (PHQ2-9): Low Risk (06/29/2023)   Depression (PHQ2-9)    PHQ-2 Score: 1  Alcohol Screen: Low Risk (06/29/2023)   Alcohol Screen    Last Alcohol Screening Score (AUDIT):  1  Housing: Low Risk (06/29/2023)   Housing Stability Vital Sign    Unable to Pay for Housing in the Last Year: No    Number of Times Moved in the Last Year: 0    Homeless in the Last Year: No  Utilities: At Risk (06/29/2023)   AHC Utilities    Threatened with loss of utilities: Yes  Health Literacy: Adequate Health Literacy (06/29/2023)   B1300 Health Literacy    Frequency of need for help with medical instructions: Rarely    Family History  Problem Relation Age of Onset   Other Mother        parents passed away in an earthquake in 1980's   Heart attack Sister    Obesity Sister  Past Surgical History:  Procedure Laterality Date   ABDOMINAL HYSTERECTOMY     LOOP RECORDER INSERTION N/A 04/11/2018   Procedure: LOOP RECORDER INSERTION;  Surgeon: Waddell Danelle ORN, MD;  Location: [facility];  Service: Cardiovascular;  Laterality: N/A;   TEE WITHOUT CARDIOVERSION N/A 02/08/2018   Procedure: TRANSESOPHAGEAL ECHOCARDIOGRAM (TEE);  Surgeon: Jeffrie Oneil BROCKS, MD;  Location: [facility] ENDOSCOPY;  Service: Cardiovascular;  Laterality: N/A;    ROS: Review of Systems Negative except as stated above  PHYSICAL EXAM: BP (!) 190/80 (BP Location: Left Arm, Patient Position: Standing, Cuff Size: Normal)   Pulse 81   Temp 98.3 F (36.8 C) (Oral)   Ht 4' 11 (1.499 m)   Wt 184 lb (83.5 kg)   SpO2 97%   BMI 37.16 kg/m   Wt Readings from Last 3 Encounters:  02/25/24 184 lb (83.5 kg)  06/29/23 184 lb (83.5 kg)  02/27/22 182 lb (82.6 kg)    Physical Exam  General appearance - alert, well appearing, older Hispanic female and in no distress Mental status - normal mood, behavior, speech, dress, motor activity, and thought processes Neck - supple, no significant adenopathy Chest - clear to auscultation, no wheezes, rales or rhonchi, symmetric air entry Heart - normal rate, regular rhythm, normal S1, S2, no murmurs, rubs, clicks or gallops Extremities - peripheral pulses normal, no pedal  edema, no clubbing or cyanosis      Latest Ref Rng & Units 06/29/2023   12:21 PM 02/27/2022    2:43 PM 05/15/2021   10:24 PM  CMP  Glucose 70 - 99 mg/dL 819  617  770   BUN 8 - 27 mg/dL 7  10  11    Creatinine 0.57 - 1.00 mg/dL 9.57  9.45  9.69   Sodium 134 - 144 mmol/L 140  138  138   Potassium 3.5 - 5.2 mmol/L 4.6  4.3  4.1   Chloride 96 - 106 mmol/L 102  101  102   CO2 20 - 29 mmol/L 22  24    Calcium  8.7 - 10.3 mg/dL 9.0  9.2    Total Protein 6.0 - 8.5 g/dL 7.2  6.9    Total Bilirubin 0.0 - 1.2 mg/dL 0.7  0.4    Alkaline Phos 44 - 121 IU/L 98  94    AST 0 - 40 IU/L 16  13    ALT 0 - 32 IU/L 16  16     Lipid Panel     Component Value Date/Time   CHOL 210 (H) 06/29/2023 1221   TRIG 174 (H) 06/29/2023 1221   HDL 45 06/29/2023 1221   CHOLHDL 4.7 (H) 06/29/2023 1221   CHOLHDL 3.2 02/01/2018 0011   VLDL 14 02/01/2018 0011   LDLCALC 134 (H) 06/29/2023 1221    CBC    Component Value Date/Time   WBC 7.3 06/29/2023 1221   WBC 8.8 04/15/2020 1251   RBC 5.04 06/29/2023 1221   RBC 4.66 04/15/2020 1251   HGB 14.5 06/29/2023 1221   HCT 45.3 06/29/2023 1221   PLT 259 06/29/2023 1221   MCV 90 06/29/2023 1221   MCH 28.8 06/29/2023 1221   MCH 27.9 04/15/2020 1251   MCHC 32.0 06/29/2023 1221   MCHC 32.8 04/15/2020 1251   RDW 13.1 06/29/2023 1221   LYMPHSABS 1.1 01/31/2018 1035   MONOABS 0.3 01/31/2018 1035   EOSABS 0.0 01/31/2018 1035   BASOSABS 0.0 01/31/2018 1035    ASSESSMENT AND PLAN: 1. Type 2 diabetes  mellitus associated with morbid obesity (HCC) (Primary) Not at goal. Recently out of Lantus  for 2 wks. RF sent.  Continue Forman 1 g twice a day and glipizide  5 mg daily.  Increase Lantus  insulin  to 37 units daily at bedtime.  Advised dietary modifications: reduce high-sugar fruits and drinks, increase lean proteins and low-sugar fruits. - Encouraged regular blood sugar monitoring and log maintenance. -Try to get diabetic eye exam done when she is able to afford. - POCT  glucose (manual entry) - POCT glycosylated hemoglobin (Hb A1C) - insulin  glargine (LANTUS ) 100 UNIT/ML injection; Inject 0.37 mLs (37 Units total) into the skin at bedtime.  Dispense: 10 mL; Refill: 5 - glipiZIDE  (GLUCOTROL  XL) 5 MG 24 hr tablet; Take 1 tablet (5 mg total) by mouth daily with breakfast.  Dispense: 90 tablet; Refill: 1 - Comprehensive metabolic panel with GFR  2. Long term (current) use of insulin  (HCC) 3. Long term (current) use of oral hypoglycemic drugs See #1 above.  4. Hypertension associated with diabetes (HCC) Not at goal.  She has not taken her medicines as yet for the morning.  She will take them when she returns home.  Continue labetalol  200 mg twice a day, amlodipine  10 mg daily and Cozaar  100 mg daily.  Advised to check blood pressure at least once a week and record readings.  Bring readings with her on follow-up visit. - labetalol  (NORMODYNE ) 200 MG tablet; Take 1 tablet (200 mg total) by mouth 2 (two) times daily.  Dispense: 180 tablet; Refill: 3  5. Hyperlipidemia associated with type 2 diabetes mellitus (HCC) Last LDL was not at goal.  Reports compliance with Crestor .  Recheck lipid profile today. - Lipid panel  6. Need for immunization against influenza - Flu vaccine trivalent PF, 6mos and older(Flulaval,Afluria,Fluarix,Fluzone)  7. Encounter for screening mammogram for malignant neoplasm of breast - MM 3D SCREENING MAMMOGRAM BILATERAL BREAST; Future  8. Screening for colon cancer - Fecal occult blood, imunochemical([facility])    Patient was given the opportunity to ask questions.  Patient verbalized understanding of the plan and was able to repeat key elements of the plan.   This documentation was completed using Paediatric nurse.  Any transcriptional errors are unintentional.  Orders Placed This Encounter  Procedures   Fecal occult blood, imunochemical([facility])   MM 3D SCREENING MAMMOGRAM BILATERAL BREAST   Flu  vaccine trivalent PF, 6mos and older(Flulaval,Afluria,Fluarix,Fluzone)   Lipid panel   Comprehensive metabolic panel with GFR   POCT glucose (manual entry)   POCT glycosylated hemoglobin (Hb A1C)     Requested Prescriptions   Signed Prescriptions Disp Refills   insulin  glargine (LANTUS ) 100 UNIT/ML injection 10 mL 5    Sig: Inject 0.37 mLs (37 Units total) into the skin at bedtime.   labetalol  (NORMODYNE ) 200 MG tablet 180 tablet 3    Sig: Take 1 tablet (200 mg total) by mouth 2 (two) times daily.   glipiZIDE  (GLUCOTROL  XL) 5 MG 24 hr tablet 90 tablet 1    Sig: Take 1 tablet (5 mg total) by mouth daily with breakfast.    Return in about 7 weeks (around 04/14/2024) for PAP.  Barnie Louder, MD, FACP     [1]  Current Outpatient Medications on File Prior to Visit  Medication Sig Dispense Refill   amLODipine  (NORVASC ) 10 MG tablet Take 1 tablet (10 mg total) by mouth daily. 90 tablet 1   aspirin  EC 81 MG tablet Take 1 tablet (81 mg total) by mouth daily. Swallow  whole. 90 tablet 1   blood glucose meter kit and supplies KIT Dispense based on patient and insurance preference. Use up to four times daily as directed. (FOR ICD-9 250.00, 250.01). 1 each 0   Blood Glucose Monitoring Suppl (TRUE METRIX METER) w/Device KIT Use to check blood sugar in the morning. 1 kit 0   fluticasone  (FLONASE ) 50 MCG/ACT nasal spray Place 1 spray into both nostrils daily. 16 g 0   glucose blood (TRUE METRIX BLOOD GLUCOSE TEST) test strip Use as instructed 100 each 12   Insulin  Syringe-Needle U-100 (TRUEPLUS INSULIN  SYRINGE) 31G X 5/16 0.3 ML MISC USE TO INJECT INSULIN  DAILY. 100 each 5   loratadine  (CLARITIN ) 10 MG tablet Take 1 tablet (10 mg total) by mouth daily. 30 tablet 1   losartan  (COZAAR ) 100 MG tablet Take 1 tablet (100 mg total) by mouth daily. 90 tablet 3   metFORMIN  (GLUCOPHAGE ) 1000 MG tablet Take 1 tablet (1,000 mg total) by mouth 2 (two) times daily with a meal. 180 tablet 3   pregabalin   (LYRICA ) 50 MG capsule Take 1 capsule (50 mg total) by mouth 2 (two) times daily. 60 capsule 4   rosuvastatin  (CRESTOR ) 20 MG tablet Take 1 tablet (20 mg total) by mouth daily. 90 tablet 3   TRUEplus Lancets 28G MISC Use to check blood sugar daily. 100 each 11   No current facility-administered medications on file prior to visit.  [2] No Known Allergies  "

## 2024-02-26 ENCOUNTER — Ambulatory Visit: Payer: Self-pay | Admitting: Internal Medicine

## 2024-02-26 LAB — COMPREHENSIVE METABOLIC PANEL WITH GFR
ALT: 15 IU/L (ref 0–32)
AST: 15 IU/L (ref 0–40)
Albumin: 4.5 g/dL (ref 3.9–4.9)
Alkaline Phosphatase: 80 IU/L (ref 49–135)
BUN/Creatinine Ratio: 18 (ref 12–28)
BUN: 11 mg/dL (ref 8–27)
Bilirubin Total: 0.5 mg/dL (ref 0.0–1.2)
CO2: 21 mmol/L (ref 20–29)
Calcium: 9.1 mg/dL (ref 8.7–10.3)
Chloride: 99 mmol/L (ref 96–106)
Creatinine, Ser: 0.6 mg/dL (ref 0.57–1.00)
Globulin, Total: 3 g/dL (ref 1.5–4.5)
Glucose: 250 mg/dL — ABNORMAL HIGH (ref 70–99)
Potassium: 4.5 mmol/L (ref 3.5–5.2)
Sodium: 135 mmol/L (ref 134–144)
Total Protein: 7.5 g/dL (ref 6.0–8.5)
eGFR: 100 mL/min/1.73

## 2024-02-26 LAB — LIPID PANEL
Chol/HDL Ratio: 4 ratio (ref 0.0–4.4)
Cholesterol, Total: 201 mg/dL — ABNORMAL HIGH (ref 100–199)
HDL: 50 mg/dL
LDL Chol Calc (NIH): 131 mg/dL — ABNORMAL HIGH (ref 0–99)
Triglycerides: 111 mg/dL (ref 0–149)
VLDL Cholesterol Cal: 20 mg/dL (ref 5–40)

## 2024-02-29 ENCOUNTER — Other Ambulatory Visit: Payer: Self-pay

## 2024-04-14 ENCOUNTER — Ambulatory Visit: Payer: Self-pay | Admitting: Internal Medicine
